# Patient Record
Sex: Female | Born: 1935
Health system: Southern US, Community
[De-identification: ages and names within clinical notes are randomized; demographics above are authoritative.]

## PROBLEM LIST (undated history)

## (undated) DIAGNOSIS — I10 Essential (primary) hypertension: Secondary | ICD-10-CM

## (undated) DIAGNOSIS — I639 Cerebral infarction, unspecified: Secondary | ICD-10-CM

## (undated) DIAGNOSIS — H548 Legal blindness, as defined in USA: Secondary | ICD-10-CM

## (undated) DIAGNOSIS — I6930 Unspecified sequelae of cerebral infarction: Secondary | ICD-10-CM

## (undated) DIAGNOSIS — I739 Peripheral vascular disease, unspecified: Secondary | ICD-10-CM

## (undated) DIAGNOSIS — E785 Hyperlipidemia, unspecified: Secondary | ICD-10-CM

## (undated) HISTORY — DX: Essential (primary) hypertension: I10

## (undated) HISTORY — DX: Peripheral vascular disease, unspecified: I73.9

## (undated) HISTORY — DX: Hyperlipidemia, unspecified: E78.5

## (undated) HISTORY — DX: Legal blindness, as defined in USA: H54.8

## (undated) HISTORY — DX: Unspecified sequelae of cerebral infarction: I69.30

---

## 2005-04-29 ENCOUNTER — Ambulatory Visit: Payer: Self-pay | Admitting: Family Medicine

## 2005-05-26 ENCOUNTER — Ambulatory Visit: Payer: Self-pay | Admitting: Unknown Physician Specialty

## 2005-05-29 ENCOUNTER — Ambulatory Visit: Payer: Self-pay | Admitting: Vascular Surgery

## 2005-06-03 ENCOUNTER — Ambulatory Visit: Payer: Self-pay | Admitting: Vascular Surgery

## 2005-09-16 ENCOUNTER — Ambulatory Visit: Payer: Self-pay | Admitting: Gastroenterology

## 2006-01-20 ENCOUNTER — Ambulatory Visit: Payer: Self-pay | Admitting: Family Medicine

## 2006-02-02 ENCOUNTER — Ambulatory Visit: Payer: Self-pay | Admitting: General Surgery

## 2006-02-02 ENCOUNTER — Other Ambulatory Visit: Payer: Self-pay

## 2006-02-03 ENCOUNTER — Ambulatory Visit: Payer: Self-pay | Admitting: General Surgery

## 2006-07-30 ENCOUNTER — Emergency Department: Payer: Self-pay | Admitting: Emergency Medicine

## 2006-07-31 ENCOUNTER — Inpatient Hospital Stay: Payer: Self-pay | Admitting: Internal Medicine

## 2006-07-31 ENCOUNTER — Other Ambulatory Visit: Payer: Self-pay

## 2006-11-28 ENCOUNTER — Other Ambulatory Visit: Payer: Self-pay

## 2006-11-28 ENCOUNTER — Emergency Department: Payer: Self-pay

## 2008-05-03 ENCOUNTER — Ambulatory Visit: Payer: Self-pay | Admitting: Family Medicine

## 2008-11-17 HISTORY — PX: PERIPHERAL ARTERIAL STENT GRAFT: SHX2220

## 2014-05-29 DIAGNOSIS — F17200 Nicotine dependence, unspecified, uncomplicated: Secondary | ICD-10-CM | POA: Diagnosis not present

## 2014-05-29 DIAGNOSIS — R5381 Other malaise: Secondary | ICD-10-CM | POA: Diagnosis not present

## 2014-05-29 DIAGNOSIS — R5383 Other fatigue: Secondary | ICD-10-CM | POA: Diagnosis not present

## 2014-05-29 DIAGNOSIS — M79609 Pain in unspecified limb: Secondary | ICD-10-CM | POA: Diagnosis not present

## 2014-06-08 DIAGNOSIS — M79609 Pain in unspecified limb: Secondary | ICD-10-CM | POA: Diagnosis not present

## 2014-06-08 DIAGNOSIS — I70229 Atherosclerosis of native arteries of extremities with rest pain, unspecified extremity: Secondary | ICD-10-CM | POA: Diagnosis not present

## 2014-06-08 DIAGNOSIS — I10 Essential (primary) hypertension: Secondary | ICD-10-CM | POA: Diagnosis not present

## 2014-06-08 DIAGNOSIS — E785 Hyperlipidemia, unspecified: Secondary | ICD-10-CM | POA: Diagnosis not present

## 2014-06-19 DIAGNOSIS — I739 Peripheral vascular disease, unspecified: Secondary | ICD-10-CM | POA: Diagnosis not present

## 2014-06-22 DIAGNOSIS — M79609 Pain in unspecified limb: Secondary | ICD-10-CM | POA: Diagnosis not present

## 2014-06-22 DIAGNOSIS — I70229 Atherosclerosis of native arteries of extremities with rest pain, unspecified extremity: Secondary | ICD-10-CM | POA: Diagnosis not present

## 2014-06-22 DIAGNOSIS — I714 Abdominal aortic aneurysm, without rupture, unspecified: Secondary | ICD-10-CM | POA: Diagnosis not present

## 2014-06-22 DIAGNOSIS — I739 Peripheral vascular disease, unspecified: Secondary | ICD-10-CM | POA: Diagnosis not present

## 2014-06-27 ENCOUNTER — Ambulatory Visit: Payer: Self-pay | Admitting: Vascular Surgery

## 2014-06-27 DIAGNOSIS — M79609 Pain in unspecified limb: Secondary | ICD-10-CM | POA: Diagnosis not present

## 2014-06-27 DIAGNOSIS — F17200 Nicotine dependence, unspecified, uncomplicated: Secondary | ICD-10-CM | POA: Diagnosis not present

## 2014-06-27 DIAGNOSIS — I70229 Atherosclerosis of native arteries of extremities with rest pain, unspecified extremity: Secondary | ICD-10-CM | POA: Diagnosis not present

## 2014-06-27 DIAGNOSIS — I714 Abdominal aortic aneurysm, without rupture, unspecified: Secondary | ICD-10-CM | POA: Diagnosis not present

## 2014-06-27 DIAGNOSIS — E785 Hyperlipidemia, unspecified: Secondary | ICD-10-CM | POA: Diagnosis not present

## 2014-06-27 LAB — BASIC METABOLIC PANEL
Anion Gap: 5 — ABNORMAL LOW (ref 7–16)
BUN: 9 mg/dL (ref 7–18)
CREATININE: 0.74 mg/dL (ref 0.60–1.30)
Calcium, Total: 8.1 mg/dL — ABNORMAL LOW (ref 8.5–10.1)
Chloride: 107 mmol/L (ref 98–107)
Co2: 28 mmol/L (ref 21–32)
GLUCOSE: 99 mg/dL (ref 65–99)
Osmolality: 278 (ref 275–301)
Potassium: 3.7 mmol/L (ref 3.5–5.1)
SODIUM: 140 mmol/L (ref 136–145)

## 2014-06-27 LAB — BUN: BUN: 9 mg/dL (ref 7–18)

## 2014-06-27 LAB — CREATININE, SERUM
Creatinine: 0.72 mg/dL (ref 0.60–1.30)
EGFR (African American): >60

## 2014-06-27 LAB — PROTIME-INR
INR: 1
PROTHROMBIN TIME: 12.7 s (ref 11.5–14.7)

## 2014-06-28 DIAGNOSIS — M79609 Pain in unspecified limb: Secondary | ICD-10-CM | POA: Diagnosis not present

## 2014-06-28 DIAGNOSIS — E785 Hyperlipidemia, unspecified: Secondary | ICD-10-CM | POA: Diagnosis not present

## 2014-06-28 DIAGNOSIS — I714 Abdominal aortic aneurysm, without rupture, unspecified: Secondary | ICD-10-CM | POA: Diagnosis not present

## 2014-06-28 DIAGNOSIS — I70229 Atherosclerosis of native arteries of extremities with rest pain, unspecified extremity: Secondary | ICD-10-CM | POA: Diagnosis not present

## 2014-06-28 DIAGNOSIS — F17200 Nicotine dependence, unspecified, uncomplicated: Secondary | ICD-10-CM | POA: Diagnosis not present

## 2014-06-28 LAB — CBC WITH DIFFERENTIAL/PLATELET
Basophil #: 0 10*3/uL (ref 0.0–0.1)
Basophil %: 0.3 %
EOS ABS: 0.1 10*3/uL (ref 0.0–0.7)
EOS PCT: 0.7 %
HCT: 31.7 % — AB (ref 35.0–47.0)
HGB: 10.1 g/dL — ABNORMAL LOW (ref 12.0–16.0)
Lymphocyte #: 2 10*3/uL (ref 1.0–3.6)
Lymphocyte %: 20.6 %
MCH: 29.7 pg (ref 26.0–34.0)
MCHC: 32 g/dL (ref 32.0–36.0)
MCV: 93 fL (ref 80–100)
MONO ABS: 0.5 x10 3/mm (ref 0.2–0.9)
Monocyte %: 5.5 %
Neutrophil #: 7 10*3/uL — ABNORMAL HIGH (ref 1.4–6.5)
Neutrophil %: 72.9 %
Platelet: 240 10*3/uL (ref 150–440)
RBC: 3.4 10*6/uL — ABNORMAL LOW (ref 3.80–5.20)
RDW: 14.6 % — ABNORMAL HIGH (ref 11.5–14.5)
WBC: 9.6 10*3/uL (ref 3.6–11.0)

## 2014-06-28 LAB — BASIC METABOLIC PANEL
Anion Gap: 6 — ABNORMAL LOW (ref 7–16)
BUN: 8 mg/dL (ref 7–18)
CHLORIDE: 108 mmol/L — AB (ref 98–107)
CREATININE: 0.68 mg/dL (ref 0.60–1.30)
Calcium, Total: 7.9 mg/dL — ABNORMAL LOW (ref 8.5–10.1)
Co2: 25 mmol/L (ref 21–32)
Glucose: 88 mg/dL (ref 65–99)
Osmolality: 275 (ref 275–301)
Potassium: 3.5 mmol/L (ref 3.5–5.1)
Sodium: 139 mmol/L (ref 136–145)

## 2014-07-07 ENCOUNTER — Ambulatory Visit: Payer: Self-pay | Admitting: Family Medicine

## 2014-07-07 DIAGNOSIS — J449 Chronic obstructive pulmonary disease, unspecified: Secondary | ICD-10-CM | POA: Diagnosis not present

## 2014-07-07 DIAGNOSIS — F17200 Nicotine dependence, unspecified, uncomplicated: Secondary | ICD-10-CM | POA: Diagnosis not present

## 2014-07-07 DIAGNOSIS — J438 Other emphysema: Secondary | ICD-10-CM | POA: Diagnosis not present

## 2014-07-07 DIAGNOSIS — J40 Bronchitis, not specified as acute or chronic: Secondary | ICD-10-CM | POA: Diagnosis not present

## 2014-07-07 DIAGNOSIS — I739 Peripheral vascular disease, unspecified: Secondary | ICD-10-CM | POA: Diagnosis not present

## 2014-07-27 DIAGNOSIS — M1990 Unspecified osteoarthritis, unspecified site: Secondary | ICD-10-CM | POA: Diagnosis not present

## 2014-07-27 DIAGNOSIS — I739 Peripheral vascular disease, unspecified: Secondary | ICD-10-CM | POA: Diagnosis not present

## 2014-07-27 DIAGNOSIS — I70219 Atherosclerosis of native arteries of extremities with intermittent claudication, unspecified extremity: Secondary | ICD-10-CM | POA: Diagnosis not present

## 2014-07-27 DIAGNOSIS — M79609 Pain in unspecified limb: Secondary | ICD-10-CM | POA: Diagnosis not present

## 2014-09-05 DIAGNOSIS — H2513 Age-related nuclear cataract, bilateral: Secondary | ICD-10-CM | POA: Diagnosis not present

## 2014-09-26 DIAGNOSIS — M79609 Pain in unspecified limb: Secondary | ICD-10-CM | POA: Diagnosis not present

## 2014-10-26 DIAGNOSIS — M1990 Unspecified osteoarthritis, unspecified site: Secondary | ICD-10-CM | POA: Diagnosis not present

## 2014-10-26 DIAGNOSIS — I10 Essential (primary) hypertension: Secondary | ICD-10-CM | POA: Diagnosis not present

## 2014-10-26 DIAGNOSIS — M79609 Pain in unspecified limb: Secondary | ICD-10-CM | POA: Diagnosis not present

## 2014-10-26 DIAGNOSIS — I739 Peripheral vascular disease, unspecified: Secondary | ICD-10-CM | POA: Diagnosis not present

## 2015-03-10 NOTE — Op Note (Signed)
PATIENT NAME:  Kaitlyn Gonzalez, Kaitlyn Gonzalez MR#:  [id number] DATE OF BIRTH:  11/24/1935  DATE OF PROCEDURE:  06/27/2014  PREOPERATIVE DIAGNOSES:  1.  Atherosclerotic occlusive disease, bilateral lower extremities with rest pain of the left lower extremity.  2.  Abdominal aortic aneurysm.  3.  Complication of vascular device.   POSTOPERATIVE DIAGNOSES: 1.  Atherosclerotic occlusive disease, bilateral lower extremities with rest pain of the left lower extremity.  2.  Abdominal aortic aneurysm.  3.  Complication of vascular device.  PROCEDURES PERFORMED:  1.  Abdominal aortogram.  2.  Left lower extremity distal runoff, third order catheter placement.  3.  Crosser atherectomy, left superficial femoral artery.  4.  Percutaneous transluminal angioplasty using Lutonix balloons, left common femoral superficial femoral artery and popliteal.   SURGEON: Katha Cabal, MD  SEDATION: Versed 5 mg plus fentanyl 200 mcg administered IV. Continuous ECG, pulse oximetry and cardiopulmonary monitoring was performed throughout the entire procedure by the interventional radiology nurse. Total sedation time was 1 hour, 50 minutes.   ACCESS: A 7 French sheath, right common femoral artery.   FLUOROSCOPY TIME: 25.3 minutes.   CONTRAST USED: Isovue 105 mL.   INDICATIONS: Ms. Gasca is a 79 year old woman with extensive atherosclerotic occlusive disease. She has undergone multiple interventions in the past and has vascular stents in several locations. She also has a known abdominal aortic aneurysm. However, this aneurysm measures less than 4 cm and does not need to be treated at this time. The most recent office evaluation demonstrated occlusion of the SFA stent and she is complaining of increasing pain in her left foot continuously throughout the day as well as in the evening consistent with rest pain. Risks and benefits for angiography and intervention were reviewed. All questions answered. The patient agrees to  proceed.   DESCRIPTION OF PROCEDURE: The patient is taken to special procedures, placed in the supine position. After adequate sedation is achieved, both groins are prepped and draped in sterile fashion. Ultrasound is placed in a sterile sleeve. Ultrasound is utilized secondary to lack of appropriate landmarks to avoid vascular injury. Under real-time visualization, the common femoral vein is identified. It is echolucent and pulsatile indicating patency. More proximally, it is free of a significant plaque burden and this is the area selected for access. Lidocaine 1% is infiltrated under visualization with the ultrasound and subsequently a micropuncture needle was inserted under ultrasound guidance. Microwire followed by micro sheath, J-wire followed by a 5 French sheath and 5 French pigtail catheter.   The pigtail catheter is positioned at the level of T12 and AP projection of the aorta is obtained.   Pigtail catheter is repositioned to above the bifurcation and bilateral oblique views of the pelvis are obtained secondary to the previously placed stents.   Using a VS1 catheter and a Rosen wire, the left common iliac stent is engaged with the assistance of the [employer] wire. The wire and catheter combination are advanced through the stents without entangling or ensnaring the struts, and the wire is negotiated down to the level of the common femoral on the left. Subsequently, the VS1 catheter is removed and a pigtail catheter is advanced. An LAO projection of the groin is obtained, and subsequently Glidewire is negotiated through the pigtail catheter into the SFA. The catheter is advanced into the SFA and distal runoff is obtained. There is reconstitution of the occluded SFA at the level of the distal margin of the previously placed stent. Distal runoff is single-vessel via the  peroneal. The at-knee and below-knee popliteal appear to be patent, although somewhat small.   Heparin 4000 units is given. Wire is  reintroduced and subsequently an [place] 7 [place] sheath is advanced up and over the bifurcation, positioned with the tip in the common femoral artery. Catheter is then advanced down into the first several centimeters of the SFA down to the level of the occlusion of the SFA. An 0.018 v-18 wire is then advanced and subsequently a straight Usher catheter is advanced, S6 prepped on the field and then delivered through the Usher catheter. The catheter and S6 device are then tracked down to the distal ends of the stent. At this level, hand injection of contrast demonstrates that there is a dissection plane, however after exchanging the Usher for an angled glide and using a stiff-angled Glidewire, the true lumen is engaged and the catheter is advanced down into the popliteal. Hand injection of contrast demonstrates true lumen placement and again confirms single-vessel peroneal runoff. Straight wire is then reintroduced and a 4 x 15 Lutonix balloon is passed from the mid popliteal distally. It is inflated to 12 atmospheres for 3 minutes. A second for 4 x 15 Lutonix is then added more proximally, again, a 3 minute inflation at 12 atmospheres and finally, a 5 x 6 Lutonix balloon is used across the common femoral and into the origin of the SFA. Followup imaging demonstrates significant residual stenosis as well as dissection in the common femoral. This does not appear to be flow-limiting; however, the residual stenoses within the SFA does appear to be problematic and therefore a 5 x 30 balloon is advanced across the SFA, inflated to 14 atmospheres for 3 minutes. Followup imaging now demonstrates there is fairly rapid flow of contrast through the SFA, popliteal, there are areas that are concerning for both non-flow-limiting dissection as well as moderate restenosis; however, given the overall situation, particularly with the non-flow-limiting dissection in the femoral, I have elected to initiate Aggrastat and observe the  patient overnight rather than start placing stents, particularly since stenting in the common femoral would be ill advised.   The sheath is then pulled into the right external. Oblique view of the right groin is obtained and a StarClose device is deployed. Initially, there appeared to be a good result; however, on transfer, the patient had a hematoma that was noted and pressure has been held. There were no other complications.   INTERPRETATION: The abdominal aorta is opacified by the bolus injection of contrast. There is a small aneurysm that appears to be similar on angiography as in real-life, based on the calcifications of the wall noted on fluoroscopy. Bilateral common iliac artery stents are noted, and these appear to be patent, although the left side is very tortuous with almost a sigmoid shape to it. Internal iliacs are rather small and diseased bilaterally.   The left common femoral demonstrates moderate disease with 50%-60%, there is a greater than 80% narrowing at the origin of the profunda, there is occlusion of the SFA several centimeters beyond its origin. This occluded segment includes the previously placed stent in the mid SFA. There is reconstitution just below the margin of the stent distally and then the popliteal remains patent from its midportion distally, there is single-vessel runoff via the peroneal. Following initial angioplasty, there is now flow, but there appears to be significant residual stenosis following re-angioplasty to 5 mm, there now appears to be fairly good flow. There are still several areas of irregularity and  a non-flow-limiting dissection of the common femoral artery.   SUMMARY: Successful recanalization. Patient has several areas of irregularity and the common femoral dissection and therefore she will be maintained on Aggrastat overnight, with the hope of discharge in the morning.    ____________________________ Katha Cabal, MD ggs:TT D: 06/27/2014  13:29:25 ET T: 06/27/2014 15:41:31 ET JOB#: [id number]  cc: Katha Cabal, MD, <Dictator> Katha Cabal MD ELECTRONICALLY SIGNED 06/27/2014 17:18

## 2015-03-12 ENCOUNTER — Inpatient Hospital Stay
Admit: 2015-03-12 | Disposition: A | Discharge status: Home / Self Care | Payer: Self-pay | Attending: Internal Medicine | Admitting: Internal Medicine

## 2015-03-12 DIAGNOSIS — H5441 Blindness, right eye, normal vision left eye: Secondary | ICD-10-CM | POA: Diagnosis not present

## 2015-03-12 DIAGNOSIS — I6521 Occlusion and stenosis of right carotid artery: Secondary | ICD-10-CM | POA: Diagnosis present

## 2015-03-12 DIAGNOSIS — I6523 Occlusion and stenosis of bilateral carotid arteries: Secondary | ICD-10-CM | POA: Diagnosis not present

## 2015-03-12 DIAGNOSIS — Z8249 Family history of ischemic heart disease and other diseases of the circulatory system: Secondary | ICD-10-CM | POA: Diagnosis not present

## 2015-03-12 DIAGNOSIS — I739 Peripheral vascular disease, unspecified: Secondary | ICD-10-CM | POA: Diagnosis present

## 2015-03-12 DIAGNOSIS — I361 Nonrheumatic tricuspid (valve) insufficiency: Secondary | ICD-10-CM | POA: Diagnosis not present

## 2015-03-12 DIAGNOSIS — E785 Hyperlipidemia, unspecified: Secondary | ICD-10-CM | POA: Diagnosis not present

## 2015-03-12 DIAGNOSIS — F17200 Nicotine dependence, unspecified, uncomplicated: Secondary | ICD-10-CM | POA: Diagnosis not present

## 2015-03-12 DIAGNOSIS — F17210 Nicotine dependence, cigarettes, uncomplicated: Secondary | ICD-10-CM | POA: Diagnosis present

## 2015-03-12 DIAGNOSIS — J984 Other disorders of lung: Secondary | ICD-10-CM | POA: Diagnosis not present

## 2015-03-12 DIAGNOSIS — R531 Weakness: Secondary | ICD-10-CM | POA: Diagnosis not present

## 2015-03-12 DIAGNOSIS — R930 Abnormal findings on diagnostic imaging of skull and head, not elsewhere classified: Secondary | ICD-10-CM | POA: Diagnosis not present

## 2015-03-12 DIAGNOSIS — I671 Cerebral aneurysm, nonruptured: Secondary | ICD-10-CM | POA: Diagnosis not present

## 2015-03-12 DIAGNOSIS — R51 Headache: Secondary | ICD-10-CM | POA: Diagnosis not present

## 2015-03-12 DIAGNOSIS — I639 Cerebral infarction, unspecified: Secondary | ICD-10-CM | POA: Diagnosis not present

## 2015-03-12 DIAGNOSIS — I10 Essential (primary) hypertension: Secondary | ICD-10-CM | POA: Diagnosis present

## 2015-03-12 DIAGNOSIS — E119 Type 2 diabetes mellitus without complications: Secondary | ICD-10-CM | POA: Diagnosis not present

## 2015-03-12 DIAGNOSIS — M79602 Pain in left arm: Secondary | ICD-10-CM | POA: Diagnosis not present

## 2015-03-12 DIAGNOSIS — G8194 Hemiplegia, unspecified affecting left nondominant side: Secondary | ICD-10-CM | POA: Diagnosis not present

## 2015-03-12 DIAGNOSIS — Z720 Tobacco use: Secondary | ICD-10-CM | POA: Diagnosis not present

## 2015-03-12 LAB — COMPREHENSIVE METABOLIC PANEL
ALK PHOS: 63 U/L
ALT: 7 U/L — AB
Albumin: 3.7 g/dL
Anion Gap: 12 (ref 7–16)
BUN: 13 mg/dL
Bilirubin,Total: 0.1 mg/dL — ABNORMAL LOW
Calcium, Total: 9.3 mg/dL
Chloride: 100 mmol/L — ABNORMAL LOW
Co2: 26 mmol/L
Creatinine: 0.77 mg/dL
EGFR (African American): >60
Glucose: 97 mg/dL
Potassium: 3.4 mmol/L — ABNORMAL LOW
SGOT(AST): 15 U/L
Sodium: 138 mmol/L
TOTAL PROTEIN: 8.2 g/dL — AB

## 2015-03-12 LAB — CBC
HCT: 37.1 % (ref 35.0–47.0)
HGB: 12.4 g/dL (ref 12.0–16.0)
MCH: 29.7 pg (ref 26.0–34.0)
MCHC: 33.3 g/dL (ref 32.0–36.0)
MCV: 89 fL (ref 80–100)
PLATELETS: 351 10*3/uL (ref 150–440)
RBC: 4.16 10*6/uL (ref 3.80–5.20)
RDW: 14.4 % (ref 11.5–14.5)
WBC: 8 10*3/uL (ref 3.6–11.0)

## 2015-03-12 LAB — APTT: Activated PTT: 31.4 secs (ref 23.6–35.9)

## 2015-03-12 LAB — PROTIME-INR
INR: 0.9
PROTHROMBIN TIME: 12.5 s

## 2015-03-12 LAB — URINALYSIS, COMPLETE
Bacteria: NONE SEEN
Bilirubin,UR: NEGATIVE
GLUCOSE, UR: NEGATIVE mg/dL (ref 0–75)
Ketone: NEGATIVE
Nitrite: NEGATIVE
Ph: 7 (ref 4.5–8.0)
Protein: 100
Specific Gravity: 1.006 (ref 1.003–1.030)

## 2015-03-13 DIAGNOSIS — I361 Nonrheumatic tricuspid (valve) insufficiency: Secondary | ICD-10-CM

## 2015-03-13 LAB — BASIC METABOLIC PANEL
Anion Gap: 7 (ref 7–16)
BUN: 11 mg/dL
CO2: 27 mmol/L
Calcium, Total: 8.8 mg/dL — ABNORMAL LOW
Chloride: 106 mmol/L
Creatinine: 0.54 mg/dL
EGFR (African American): >60
EGFR (Non-African Amer.): >60
GLUCOSE: 113 mg/dL — AB
Potassium: 3.4 mmol/L — ABNORMAL LOW
Sodium: 140 mmol/L

## 2015-03-13 LAB — MAGNESIUM: MAGNESIUM: 1.7 mg/dL

## 2015-03-13 LAB — CBC WITH DIFFERENTIAL/PLATELET
BASOS ABS: 0 10*3/uL (ref 0.0–0.1)
Basophil %: 0.5 %
Eosinophil #: 0.2 10*3/uL (ref 0.0–0.7)
Eosinophil %: 2.3 %
HCT: 35.5 % (ref 35.0–47.0)
HGB: 11.6 g/dL — ABNORMAL LOW (ref 12.0–16.0)
Lymphocyte #: 2.6 10*3/uL (ref 1.0–3.6)
Lymphocyte %: 32.3 %
MCH: 29.1 pg (ref 26.0–34.0)
MCHC: 32.6 g/dL (ref 32.0–36.0)
MCV: 89 fL (ref 80–100)
MONOS PCT: 5.8 %
Monocyte #: 0.5 x10 3/mm (ref 0.2–0.9)
NEUTROS PCT: 59.1 %
Neutrophil #: 4.8 10*3/uL (ref 1.4–6.5)
PLATELETS: 354 10*3/uL (ref 150–440)
RBC: 3.98 10*6/uL (ref 3.80–5.20)
RDW: 14.5 % (ref 11.5–14.5)
WBC: 8.2 10*3/uL (ref 3.6–11.0)

## 2015-03-13 LAB — LIPID PANEL
CHOLESTEROL: 167 mg/dL
HDL Cholesterol: 52 mg/dL
Ldl Cholesterol, Calc: 99 mg/dL
Triglycerides: 82 mg/dL
VLDL Cholesterol, Calc: 16 mg/dL

## 2015-03-13 LAB — HEMOGLOBIN A1C: Hemoglobin A1C: 5.3 %

## 2015-03-13 LAB — TSH: Thyroid Stimulating Horm: 1.44 u[IU]/mL

## 2015-03-18 NOTE — Discharge Summary (Signed)
PATIENT NAME:  Kaitlyn Gonzalez, Kaitlyn Gonzalez MR#:  [id number] DATE OF BIRTH:  April 17, 1936  DATE OF ADMISSION:  03/12/2015 DATE OF DISCHARGE:  03/13/2015  ADMITTING PHYSICIAN:  Nicholes Mango, MD   DISCHARGING PHYSICIAN:   Gladstone Lighter, MD   PRIMARY CARE PHYSICIAN: Debbora Dus, MD  [place]:  Neurology consultation with Dr. Leotis Pain, MD.   DISCHARGE DIAGNOSES: 1. Acute cerebrovascular accident with minimal left sided residual weakness.  2. Hypertension.  3. Hyperlipidemia.  4. Right eye legal blindness.  5. Significant peripheral vascular disease.   DISCHARGE HOME MEDICATIONS:  1. Multivitamin 1 tablet p.o. every day. 2. Simvastatin 20 mg p.o. daily.  3. Aspirin 81 mg p.o. daily.  4. Amlodipine 5 mg p.o. daily.  5. Metoprolol 25 mg p.o. b.i.d.   DISCHARGE DIET: Low sodium diet.   DISCHARGE ACTIVITY: As tolerated.    FOLLOWUP INSTRUCTIONS: 1. Neurology follow-up in 4 weeks.  2. Vascular follow-up in 4-6 weeks.  3. PCP follow-up in 2 weeks.  4. Outpatient physical therapy, services.   LABORATORIES AND IMAGING STUDIES PRIOR TO DISCHARGE:  1. MRI of the brain showing acute subinsular and deep white matter infarct in the right centrum semiovale likely related to small vessel disease.  Generalized atrophy and moderately advanced small vessel disease throughout the brain is noted.  2. Ultrasound Dopplers of the carotid arteries showing moderate calcified irregular plaque formation noted in  proximal right internal carotid artery consistent with 50-69% stenosis.  Mild irregular calcified plaque also noted in left internal carotid artery consistent with  less than 50% stenosis.  3. MRA of the neck confirming 65% proximal right internal carotid artery   stenosis, 50% proximal left internal carotid artery stenosis and 50% proximal left subclavian artery stenosis.  4. 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   Mild stenosis of the distal left vertebral artery.   5. LDL cholesterol 99, HDL 52, total cholesterol 167, triglycerides 82.  6. WBC 8.2, hemoglobin 11.6, hematocrit 35.5, platelet count 354.  7. Sodium 140, potassium 3.4, chloride 106, bicarbonate 27, BUN 11, creatinine 0.54, glucose 113, and calcium of 8.8. TSH is 1.4, magnesium 1.7.   HbA1c is 5.3.   8. Echo Doppler showing normal LV ejection fraction, EF of 60-65%, normal right ventricular size and systolic function, mild tricuspid regurgitation.  9. Urinalysis negative for any infection.  10. CT of the head on admission showing  no acute intracranial abnormalities. There is  possible cavernous sinus mass versus. Pituitary adenoma.  Please do an MRI of the brain to confirm. Chest x-ray showing no clear acute cardiopulmonary disease.   BRIEF HOSPITAL COURSE: Kaitlyn Gonzalez is a 79 year old African American female with a past medical history significant for severe peripheral vascular disease requiring stents in the lower extremities, not taking any medications at home, presents to the hospital secondary to left-sided weakness.  1. Left-sided weakness. CT of the head did not show any acute intracranial findings. The patient had persistent weakness on the left side; however, she was recovering from the same. She was supposed to be on aspirin for her PVD, which she was not taking regularly. She says that bigger strength aspirin has caused her significant stomach problems. She is started on low-dose aspirin. Cholesterol was 99.  She was started on a statin.   Carotid Doppler shows moderate carotid artery stenosis. An MRA of the brain confined acute infarct in the  right centrum semiovale. Her weakness has recovered to the extent that she did not require any home health after evaluating by PT and OT. The patient will be discharged home on aspirin and statin. She was also seen by a neurologist.  2. For her  severe peripheral vascular disease including the moderate carotid artery stenosis and subclavian stenosis and lower extremity PVD, she can follow up with vascular.  Incidental finding of 2 cm partially thrombosed cavernous sinus aneurysm was also noted and was explained to the family.   3. Hypertension. Started on metoprolol and Norvasc.   Her course has been otherwise uneventful in the hospital.   DISCHARGE CONDITION: Stable.   DISCHARGE DISPOSITION: Home.   TIME SPENT ON DISCHARGE: 40 minutes.     ____________________________ Gladstone Lighter, MD rk:tr D: 03/14/2015 10:37:00 ET T: 03/14/2015 13:27:21 ET JOB#: [id number]  cc: Gladstone Lighter, MD, <Dictator> Gladstone Lighter MD ELECTRONICALLY SIGNED 03/15/2015 18:17

## 2015-03-18 NOTE — H&P (Addendum)
PATIENT NAME:  Kaitlyn Gonzalez, Kaitlyn Gonzalez MR#:  [id number] DATE OF BIRTH:  [birth year]  DATE OF ADMISSION:  03/12/2015  PRIMARY CARE PHYSICIAN: Fara Olden B. Jacqualine Code, MD  REFERRING EMERGENCY DEPARTMENT PHYSICIAN: Briant Sites. Joni Fears, MD   CHIEF COMPLAINT: Left-sided weakness from 8:30 a.m.   HISTORY OF PRESENT ILLNESS: The patient is a 79 year old African American female with past medical history of peripheral vascular disease status post stents and no other medical problem who is presenting to the ED with a chief complaint of left-sided weakness, started from 8:30 a.m. today. The patient woke up with a headache. She took some Tylenol and subsequently it was resolved. Following that she started having left upper extremity and lower extremity weakness. Denies any dysphagia or dysarthria. No other complaints. No chest pain, shortness of breath. Came into the ED. CAT scan of the head did not reveal any acute stroke, but the patient still has left upper and lower extremity weakness which is all new. ED physician has given her aspirin and hospitalist team is called to admit the patient. During my examination, her blood pressure was elevated. Initial blood pressure when she came into the ED was 188/91. During my examination, systolic blood pressure was at 201/95. Sister is at bedside. No other complaints.   PAST MEDICAL HISTORY: Peripheral vascular disease status post stent.   PAST SURGICAL HISTORY: Stent placement for peripheral vascular disease.   ALLERGIES: No known drug allergies.   PSYCHOSOCIAL HISTORY: Lives at home. Still continues to smoke sometimes. Denies alcohol or illicit drug usage.   FAMILY HISTORY: Mother had history of hypertension.   HOME MEDICATIONS: Multivitamin with minerals once daily.  REVIEW OF SYSTEMS: CONSTITUTIONAL: Denies any fever, fatigue.  EYES: Denies blurry vision, double vision, but has right eye blindness after a foreign object damaged her right eye.  ENT: No tinnitus, discharge,  no hearing loss.  RESPIRATORY: Denies cough, COPD.  CARDIOVASCULAR: No chest pain, palpitations.  GASTROINTESTINAL: Denies nausea, vomiting, diarrhea, abdominal pain. GENITOURINARY: No dysuria or hematuria. GYNECOLOGIC AND BREASTS: Denies breast mass or vaginal discharge.  ENDOCRINE: Denies polyuria, nocturia, thyroid problems. HEMATOLOGIC AND LYMPHATIC: No anemia, easy bruising, bleeding.  INTEGUMENTARY: No acne, rash, lesions.  MUSCULOSKELETAL: No joint pain in the neck and back. Denies gout.  NEUROLOGIC: Denies vertigo, ataxia. No history of CVA, TIA.  PSYCHIATRIC: No ADD, OCD.   PHYSICAL EXAMINATION:  VITAL SIGNS: Temperature 97.5, pulse 90, respirations 18, blood pressure 187/95, pulse oximetry of 98% on room air.  GENERAL APPEARANCE: Not in acute distress. Moderately built and thin-looking African American female in no apparent distress.  HEENT: Normocephalic, atraumatic. Right eye is blind after foreign object damaged her right eye. Left eye pupil is reacting to light and accommodation.  NECK: Supple. No JVD. No thyromegaly. No masses.  LUNGS: Clear to auscultation bilaterally. No accessory muscle use. CHEST: There is no anterior chest wall tenderness on palpation.  CARDIAC: S1, S2 normal. Regular rate and rhythm. Positive murmur.  GASTROINTESTINAL: Soft. Bowel sounds are positive in all 4 quadrants. Nontender, nondistended. No masses felt.  NEUROLOGIC: Awake, alert, oriented x 3. No pronator drift. No obliteration of the nasolabial folds. No pronator drift but left upper extremity and lower extremity motor is decreased at 3/5. Sensory decreased, touch sensation and pain sensation of the left upper and lower extremity. Reflexes are 2+. No cerebellar signs.  EXTREMITIES: No edema, no cyanosis or clubbing.  SKIN: Warm to touch. Normal turgor. No rashes. No lesions.  MUSCULOSKELETAL: No joint effusion, tenderness, or edema. PSYCHIATRIC:  Normal mood and affect.  LABORATORY AND IMAGING  STUDIES: CAT scan of the head: No acute intracranial abnormalities. Left sellar cavernous sinus mass, most likely a pituitary macroadenoma. The patient does have prior imaging of the head, but the studies were unavailable for comparison at the time of this dictation, and this patient does not have a known sellar mass. Followup MRA of the brain with and without contrast will be recommended. Twelve-lead EKG: Sinus tachycardia at 95 beats per minute, first-degree AV block with prolonged PR interval at 202 ms, left ventricular hypertrophy, no acute ST-T wave changes. Chest x-ray: No acute abnormalities. Accu-Chek 105, glucose 97, BUN and creatinine normal, sodium normal, potassium 3.4, chloride 100, CO2 of 23. GFR greater than 60. Anion gap and calcium are normal. CBC normal. PT, INR is normal. LFTs: Total protein 8.2, albumin 3.7, bilirubin total is 0.1, ALT 7, alkaline phosphatase 63, AST 15.   ASSESSMENT AND PLAN: A 79 year old African American female presenting to the Emergency Department with a chief complaint of left upper extremity and lower extremity weakness since 8:30 a.m. Still having weakness. Her CT, head, is negative, but has revealed a pituitary macroadenoma. During my examination, blood pressure is very high, with systolic being at 290, with no known history of hypertension. Will be admitted with the following assessment and plan:  1.  Acute cerebrovascular accident with left-sided weakness from 8:30 a.m. today. CT, head, is negative for acute infarct, but the patient is still symptomatic. Will admit her to telemetry. Will provide her aspirin and statin.  2.  Will continue monitoring neurologic checks.  3.  Stroke workup with MRA of the brain, carotid Dopplers, and echocardiogram.  4.  Neurology consultation is placed to Dr. Irish Elders.  5.  Malignant hypertension with no past diagnosis of hypertension. Will start her on low-dose metoprolol 25 mg p.o. b.i.d. and allow permissive hypertension. Our  goal is to maintain systolic blood pressure at around 211D and diastolic at around 552C.  6.  Peripheral vascular disease status post stent. Will continue aspirin.  7.  Nicotine dependence. Counseled to quit completely for 3 to 5 minutes. She verbalized understanding. She wants to try on her own. Not considering nicotine patch at this time. 8.  We will provide gastrointestinal and deep venous thrombosis prophylaxis with Pepcid and Lovenox subcutaneous.  CODE STATUS: She is full code. Sister is the medical power-of-attorney.  Plan of care discussed in detail with the patient. She verbalized understanding of the plan.  TOTAL TIME SPENT: 50 minutes.   ____________________________ Nicholes Mango, MD ag:ST D: 03/12/2015 20:46:36 ET T: 03/12/2015 22:20:48 ET JOB#: [id number]  cc: Nicholes Mango, MD, <Dictator> Milinda Pointer. Jacqualine Code, MD Nicholes Mango MD ELECTRONICALLY SIGNED 03/18/2015 0:13

## 2015-03-18 NOTE — Consult Note (Signed)
PATIENT NAME:  Kaitlyn Gonzalez, Kaitlyn Gonzalez MR#:  [id number] DATE OF BIRTH:  [birth year]  DATE OF CONSULTATION:  03/13/2015  REFERRING PHYSICIAN:   CONSULTING PHYSICIAN:  Leotis Pain, MD  REASON FOR CONSULTATION: Left-sided weakness.   HISTORY OF PRESENT ILLNESS: This is a 79 year old African American female with past medical history of diabetes, hypertension, and smoking who presents with left upper extremity more so than left lower extremity weakness that has significantly improved. No new visual deficits. No new sensory deficits. Elevated blood pressure on admission, systolic blood pressure 417. Family is currently at bedside.  PAST MEDICAL HISTORY: Peripheral vascular disease, status post stent placement.   ALLERGIES: No known drug allergies.   PSYCHOSOCIAL HISTORY: Lives at home. Daily smoker. Denies any EtOH or illicit drug use.   FAMILY HISTORY: Hypertension.   HOME MEDICATIONS: Only multivitamins and seldomly uses her diabetes medications.   REVIEW OF SYSTEMS: No shortness of breath. Eye blindness on the right, which is chronic. No chest pain. No shortness of breath. Positive weakness in the left upper extremity. No anxiety. No depression. Denies any vertigo. Denies any ataxia. Denies any polyuria. Denies any nocturia.   DIAGNOSTIC DATA: MRI of the brain: the patient has right semiovale lacunar infarct.   CT scan of the head: No acute intracranial abnormality.   Laboratory work-up has been reviewed.   MEDICATIONS: Have been reviewed.   NEUROLOGIC EVALUATION: The patient is alert, awake and oriented to time, place, location and the reason why she is in the hospital. Facial sensation appears to be intact. Speech appears to be fluent. Visual loss in the right eye, which is chronic. Extraocular movements are intact. Right eye exotropia, chronic in nature again. Tongue is midline. Uvula elevates symmetrically. Shoulder shrug intact. Motor strength: Left upper extremity drift. Rest is 5/5.  Reflexes severely diminished. Sensation intact to light touch and temperature. Coordination: Finger-to-nose intact. No signs of ataxia present.   IMPRESSION: A 79 year old female presenting with left upper extremity numbness that has improved in the setting of hypertension, diabetes and long history of smoking, not on any antiplatelet therapy, has right semiovale stroke.   PLAN: Antiplatelet therapy with aspirin. Lipitor for her cholesterol. Physical therapy, occupational therapy, and discharge planning. Follow up with neurology as outpatient. The patient was strongly encouraged to stop smoking. More aggressive blood pressure and diabetic control. This was also discussed with family at beside.  Thank you. It was a pleasure seeing this patient.   ____________________________ Leotis Pain, MD yz:sb D: 03/13/2015 14:31:55 ET T: 03/13/2015 14:46:16 ET JOB#: [id number]  cc: Leotis Pain, MD, <Dictator> Leotis Pain MD ELECTRONICALLY SIGNED 03/13/2015 21:28

## 2015-03-29 DIAGNOSIS — I6350 Cerebral infarction due to unspecified occlusion or stenosis of unspecified cerebral artery: Secondary | ICD-10-CM | POA: Diagnosis not present

## 2015-03-29 DIAGNOSIS — M25552 Pain in left hip: Secondary | ICD-10-CM | POA: Diagnosis not present

## 2015-03-29 DIAGNOSIS — G8929 Other chronic pain: Secondary | ICD-10-CM | POA: Diagnosis not present

## 2015-04-04 DIAGNOSIS — M7072 Other bursitis of hip, left hip: Secondary | ICD-10-CM | POA: Diagnosis not present

## 2015-04-04 DIAGNOSIS — M25552 Pain in left hip: Secondary | ICD-10-CM | POA: Diagnosis not present

## 2016-02-06 DIAGNOSIS — M7552 Bursitis of left shoulder: Secondary | ICD-10-CM | POA: Diagnosis not present

## 2016-02-15 ENCOUNTER — Encounter: Payer: Self-pay | Admitting: Family Medicine

## 2016-02-15 ENCOUNTER — Ambulatory Visit (INDEPENDENT_AMBULATORY_CARE_PROVIDER_SITE_OTHER): Payer: Medicare Other | Admitting: Family Medicine

## 2016-02-15 VITALS — BP 112/67 | HR 88 | Temp 98.6°F | Resp 17 | Ht 64.0 in | Wt 88.1 lb

## 2016-02-15 DIAGNOSIS — R011 Cardiac murmur, unspecified: Secondary | ICD-10-CM | POA: Diagnosis not present

## 2016-02-15 DIAGNOSIS — I739 Peripheral vascular disease, unspecified: Secondary | ICD-10-CM

## 2016-02-15 DIAGNOSIS — I6930 Unspecified sequelae of cerebral infarction: Secondary | ICD-10-CM | POA: Diagnosis not present

## 2016-02-15 DIAGNOSIS — I10 Essential (primary) hypertension: Secondary | ICD-10-CM | POA: Insufficient documentation

## 2016-02-15 DIAGNOSIS — Z23 Encounter for immunization: Secondary | ICD-10-CM

## 2016-02-15 DIAGNOSIS — E785 Hyperlipidemia, unspecified: Secondary | ICD-10-CM | POA: Diagnosis not present

## 2016-02-15 MED ORDER — AMLODIPINE BESYLATE 5 MG PO TABS: 5.0000 mg | ORAL_TABLET | Freq: Every day | ORAL | Status: DC

## 2016-02-15 MED ORDER — SIMVASTATIN 20 MG PO TABS: 20.0000 mg | ORAL_TABLET | Freq: Every day | ORAL | Status: DC

## 2016-02-15 MED ORDER — METOPROLOL TARTRATE 25 MG PO TABS: 25.0000 mg | ORAL_TABLET | Freq: Two times a day (BID) | ORAL | Status: DC

## 2016-02-15 NOTE — Progress Notes (Signed)
Name: Kaitlyn Gonzalez   MRN: [id number]    DOB: [birth year]   Date:02/15/2016       Progress Note  Subjective  Chief Complaint  Chief Complaint  Patient presents with  . Medication Refill    HPI  Hypertension: Pt. Presents for evaluation of Blood Pressure. She was on Amlodipine 5 mg daily and Metoprolol 25 mg twice daily. Since she ran out of medication in June 2016, she has not taken any anti-hypertensive medications and has not followed up with any doctor except for Orthopedics. Her Blood Pressure is well-controlled today but was elevated in the Orthopedics office at 162/92.  History of Stroke: Pt. Has experienced a stroke in the deep white matter and subinsular region in April 2016, was started on Aspirin 81 mg and statin. MRA neck showed 65% proximal right ICA stenosis.She never followed up with Neurology and is not on either of these meds.   Hyperlipidemia: Was started on Simvastatin since her hospital discharge in April 2016, will obtain FLP and restart on statin therapy.  Peripheral vascular Disease: Pt. Has history of PVD, was being followed by Vascular surgery in the past, not seen lately. She has been on no anticoagulation, still continues to smoke. Pain is present mainly in left leg, worse with walking, better when she rests.    Past Medical History  Diagnosis Date  . Hypertension   . Dyslipidemia   . Peripheral vascular disease ([place])   . History of stroke with residual effects     Stroke in April 2016.  . Legally blind in right eye, as defined in [place]     Past Surgical History  Procedure Laterality Date  . Peripheral arterial stent graft Left 2010    Family History  Problem Relation Age of Onset  . Diabetes Sister   . Cancer Sister     breast  . Alzheimer's disease Mother   . AAA (abdominal aortic aneurysm) Father     Social History   Social History  . Marital Status: Divorced    Spouse Name: N/A  . Number of Children: N/A  . Years of Education: N/A    Occupational History  . Not on file.   Social History Main Topics  . Smoking status: Current Every Day Smoker -- 0.50 packs/day    Types: Cigarettes  . Smokeless tobacco: Never Used  . Alcohol Use: No  . Drug Use: No  . Sexual Activity: No   Other Topics Concern  . Not on file   Social History Narrative  . No narrative on file     Current outpatient prescriptions:  .  amLODipine (NORVASC) 5 MG tablet, Take 1 tablet (5 mg total) by mouth daily., Disp: 90 tablet, Rfl: 1 .  metoprolol tartrate (LOPRESSOR) 25 MG tablet, Take 1 tablet (25 mg total) by mouth 2 (two) times daily., Disp: 180 tablet, Rfl: 1 .  simvastatin (ZOCOR) 20 MG tablet, Take 1 tablet (20 mg total) by mouth at bedtime., Disp: 90 tablet, Rfl: 1  No Known Allergies   Review of Systems  Constitutional: Negative for fever, chills, weight loss and malaise/fatigue.  Cardiovascular: Negative for chest pain and palpitations.  Gastrointestinal: Negative for nausea, vomiting and abdominal pain.  Musculoskeletal: Positive for joint pain (left leg pain.).  Neurological: Negative for dizziness, tingling, speech change, focal weakness and headaches.    Objective  Filed Vitals:   02/15/16 1029  BP: 112/67  Pulse: 88  Temp: 98.6 F (37 C)  TempSrc: Oral  Resp:  17  Height: 5\' 4"  (1.626 m)  Weight: 88 lb 1.6 oz (39.962 kg)  SpO2: 98%    Physical Exam  Constitutional: She is oriented to person, place, and time and well-developed, well-nourished, and in no distress.  Cardiovascular: Normal rate, S1 normal and S2 normal.   Murmur heard.  Systolic murmur is present with a grade of 3/6  Pulses:      Dorsalis pedis pulses are 1+ on the right side, and 2+ on the left side.  Lower extremity cool to touch, capillary refill is normal, right DP pulse is weak, left DP pulse is normal. No wounds/ulcers on lower extremity  Pulmonary/Chest: Breath sounds normal. She has no wheezes.  Abdominal: Soft. Bowel sounds are  normal.  Neurological: She is alert and oriented to person, place, and time.  Nursing note and vitals reviewed.    Assessment & Plan  1. Need for immunization against influenza  - Flu Vaccine QUAD 36+ mos PF IM (Fluarix & Fluzone Quad PF)  2. Essential hypertension Restarted on the 2 blood pressure medications prescribed at the time of discharge from [facility] in April 2016 after her hospitalization for acute stroke. Advised to take half tablet daily and gradually titrated up based on patient's blood pressure response. - amLODipine (NORVASC) 5 MG tablet; Take 1 tablet (5 mg total) by mouth daily.  Dispense: 90 tablet; Refill: 1 - metoprolol tartrate (LOPRESSOR) 25 MG tablet; Take 1 tablet (25 mg total) by mouth 2 (two) times daily.  Dispense: 180 tablet; Refill: 1  3. Peripheral vascular disease ([place]) In the past, patient has been evaluated by vascular, was on Plavix as well, which was discontinued. Advised to follow-up with vascular surgery and referral provided. - Ambulatory referral to Vascular Surgery  4. Dyslipidemia  - Comprehensive Metabolic Panel (CMET) - Lipid Profile - simvastatin (ZOCOR) 20 MG tablet; Take 1 tablet (20 mg total) by mouth at bedtime.  Dispense: 90 tablet; Refill: 1  5. History of stroke with residual effects Patient never followed up with neurology after her discharge from [facility] in April 2016. Advised to start on 81 mg aspirin and referral to neurology provided to - Ambulatory referral to Neurology  6. Heart murmur on physical examination  - Ambulatory referral to Cardiology   [facility] 02/15/2016 3:57 PM

## 2016-02-16 LAB — COMPREHENSIVE METABOLIC PANEL
ALBUMIN: 4.3 g/dL (ref 3.5–4.8)
ALK PHOS: 64 IU/L (ref 39–117)
ALT: 4 IU/L (ref 0–32)
AST: 10 IU/L (ref 0–40)
Albumin/Globulin Ratio: 1.3 (ref 1.2–2.2)
BUN / CREAT RATIO: 16 (ref 11–26)
BUN: 9 mg/dL (ref 8–27)
Bilirubin Total: 0.3 mg/dL (ref 0.0–1.2)
CHLORIDE: 96 mmol/L (ref 96–106)
CO2: 26 mmol/L (ref 18–29)
CREATININE: 0.55 mg/dL — AB (ref 0.57–1.00)
Calcium: 9.4 mg/dL (ref 8.7–10.3)
GFR calc Af Amer: 105 mL/min/{1.73_m2} (ref >59)
GFR calc non Af Amer: 91 mL/min/{1.73_m2} (ref >59)
GLUCOSE: 90 mg/dL (ref 65–99)
Globulin, Total: 3.3 g/dL (ref 1.5–4.5)
Potassium: 3.7 mmol/L (ref 3.5–5.2)
Sodium: 142 mmol/L (ref 134–144)
Total Protein: 7.6 g/dL (ref 6.0–8.5)

## 2016-02-16 LAB — LIPID PANEL
CHOLESTEROL TOTAL: 191 mg/dL (ref 100–199)
Chol/HDL Ratio: 3.3 ratio units (ref 0.0–4.4)
HDL: 58 mg/dL (ref >39)
LDL CALC: 121 mg/dL — AB (ref 0–99)
TRIGLYCERIDES: 62 mg/dL (ref 0–149)
VLDL CHOLESTEROL CAL: 12 mg/dL (ref 5–40)

## 2016-03-05 ENCOUNTER — Ambulatory Visit: Payer: Medicare Other | Admitting: Cardiology

## 2016-03-25 ENCOUNTER — Ambulatory Visit (INDEPENDENT_AMBULATORY_CARE_PROVIDER_SITE_OTHER): Payer: Medicare Other | Admitting: Cardiology

## 2016-03-25 ENCOUNTER — Encounter: Payer: Self-pay | Admitting: Cardiology

## 2016-03-25 VITALS — BP 144/90 | HR 81 | Ht 64.0 in | Wt 87.0 lb

## 2016-03-25 DIAGNOSIS — I10 Essential (primary) hypertension: Secondary | ICD-10-CM | POA: Diagnosis not present

## 2016-03-25 DIAGNOSIS — R011 Cardiac murmur, unspecified: Secondary | ICD-10-CM

## 2016-03-25 DIAGNOSIS — F17200 Nicotine dependence, unspecified, uncomplicated: Secondary | ICD-10-CM

## 2016-03-25 DIAGNOSIS — I493 Ventricular premature depolarization: Secondary | ICD-10-CM | POA: Diagnosis not present

## 2016-03-25 DIAGNOSIS — I739 Peripheral vascular disease, unspecified: Secondary | ICD-10-CM

## 2016-03-25 NOTE — Progress Notes (Signed)
Cardiology Office Note   Date:  03/25/2016   ID:  Shaughnessy Brueck, DOB 01/29/1936, MRN [id number]  Referring Doctor:  Keith Rake, MD   Cardiologist:   Wende Bushy, MD   Reason for consultation:  Chief Complaint  Patient presents with  . Cerebrovascular Accident    no cp, sob or swelling. no other complaints.  . Heart Murmur      History of Present Illness: Kaitlyn Gonzalez is a 80 y.o. female who presents for  Evaluation of murmur    PCP noted a systolic murmur on physical exam. Patient does not complain of any chest pain, shortness of breath. She is able to walk a flight of stairs without any chest pains or shortness of breath. No palpitations. No lightheadedness or dizziness. No PND, orthopnea, edema. Next  She continues to smoke a half a pack a day. She knows she needs to stop smoking.   ROS:  Please see the history of present illness. Aside from mentioned under HPI, all other systems are reviewed and negative.     Past Medical History  Diagnosis Date  . Hypertension   . Dyslipidemia   . Peripheral vascular disease ([place])   . History of stroke with residual effects     Stroke in April 2016.  . Legally blind in right eye, as defined in [place]     Past Surgical History  Procedure Laterality Date  . Peripheral arterial stent graft Left 2010     reports that she has been smoking Cigarettes.  She has been smoking about 0.50 packs per day. She has never used smokeless tobacco. She reports that she does not drink alcohol or use illicit drugs.   family history includes AAA (abdominal aortic aneurysm) in her father; Alzheimer's disease in her mother; Cancer in her sister; Diabetes in her sister.   Current Outpatient Prescriptions  Medication Sig Dispense Refill  . amLODipine (NORVASC) 5 MG tablet Take 1 tablet (5 mg total) by mouth daily. 90 tablet 1  . metoprolol tartrate (LOPRESSOR) 25 MG tablet Take 1 tablet (25 mg total) by mouth 2 (two) times daily. 180  tablet 1  . simvastatin (ZOCOR) 20 MG tablet Take 1 tablet (20 mg total) by mouth at bedtime. 90 tablet 1   No current facility-administered medications for this visit.    She is supposed to takeaspirin 81 mg by mouth daily although patient does not regularly take this  Allergies: Review of patient's allergies indicates no known allergies.    PHYSICAL EXAM: VS:  BP 144/90 mmHg  Pulse 81  Ht 5\' 4"  (1.626 m)  Wt 87 lb (39.463 kg)  BMI 14.93 kg/m2 , Body mass index is 14.93 kg/(m^2). Wt Readings from Last 3 Encounters:  03/25/16 87 lb (39.463 kg)  02/15/16 88 lb 1.6 oz (39.962 kg)    GENERAL:  well developed,  Thin appearing, not in acute distress HEENT: normocephalic, pink conjunctivae, anicteric sclerae, no xanthelasma, normal dentition, oropharynx clear NECK:  no neck vein engorgement, JVP normal, no hepatojugular reflux, carotid upstroke brisk and symmetric, no bruit, no thyromegaly, no lymphadenopathy LUNGS:  good respiratory effort, clear to auscultation bilaterally CV:  PMI not displaced, no thrills, no lifts, S1 and S2 within normal limits, no palpable S3 or S4,  Systolic murmur , 2 out of 6, nonradiating, no rubs, no gallops ABD:  Soft, nontender, nondistended, normoactive bowel sounds, no abdominal aortic bruit, no hepatomegaly, no splenomegaly MS: nontender back, no kyphosis, no scoliosis, no joint  deformities EXT:   1+ DP/PT pulses, no edema, no varicosities, no cyanosis, no clubbing SKIN: warm, nondiaphoretic, normal turgor, no ulcers NEUROPSYCH: alert, oriented to person, place, and time, sensory/motor grossly intact, normal mood, appropriate affect  Recent Labs: 02/15/2016: ALT 4; BUN 9; Creatinine, Ser 0.55*; Potassium 3.7; Sodium 142   Lipid Panel    Component Value Date/Time   CHOL 191 02/15/2016 1144   CHOL 167 03/13/2015 0339   TRIG 62 02/15/2016 1144   TRIG 82 03/13/2015 0339   HDL 58 02/15/2016 1144   HDL 52 03/13/2015 0339   CHOLHDL 3.3 02/15/2016 1144    VLDL 16 03/13/2015 0339   LDLCALC 121* 02/15/2016 1144   LDLCALC 99 03/13/2015 0339     Other studies Reviewed:  EKG:  The ekg from  03/25/2016 was personally reviewed by me and it revealed  Sinus rhythm, 81 BPM. PACs and PVC. Nonspecific ST-T wave changes.  Additional studies/ records that were reviewed personally reviewed by me today include:  None available   ASSESSMENT AND PLAN:  systolic murmur  Recommend echocardiogram   PACs/PVCs  Patient unaware of ectopic beats. Recommend 24-hour Holter monitor to determine burden.   Peripheral arterial disease  Encouraged patient to take aspirin 8100s. Daily daily. Agree with Zocor. LDL goal is less than 70. PCP following labs.   Hypertension Continue monitoring BP. Continue current medical therapy and lifestyle changes.   Tobacco use disorder We discussed the importance of smoking cessation and different strategies for quitting.    Current medicines are reviewed at length with the patient today.  The patient does not have concerns regarding medicines.  Labs/ tests ordered today include:  Orders Placed This Encounter  Procedures  . Holter monitor - 24 hour  . EKG 12-Lead  . Echocardiogram    I had a lengthy and detailed discussion with the patient regarding diagnoses, prognosis, diagnostic options, treatment options .   I counseled the patient on importance of lifestyle modification including heart healthy diet, regular physical activity , and smoking cessation.   Disposition:   FU with undersigned after tests   I spent at least 45 minutes with the patient today and more than 50% of the time was spent counseling the patient and coordinating care.     Signed, Wende Bushy, MD  03/25/2016 3:47 PM    [place]

## 2016-03-25 NOTE — Patient Instructions (Addendum)
Medication Instructions:  Your physician recommends that you continue on your current medications as directed. Please refer to the Current Medication list given to you today.   Labwork: None ordered  Testing/Procedures: Your physician has requested that you have an echocardiogram. Echocardiography is a painless test that uses sound waves to create images of your heart. It provides your doctor with information about the size and shape of your heart and how well your heart's chambers and valves are working. This procedure takes approximately one hour. There are no restrictions for this procedure.  Date & Time: ________________________________________________________________  Your physician has recommended that you wear a holter monitor. Holter monitors are medical devices that record the heart's electrical activity. Doctors most often use these monitors to diagnose arrhythmias. Arrhythmias are problems with the speed or rhythm of the heartbeat. The monitor is a small, portable device. You can wear one while you do your normal daily activities. This is usually used to diagnose what is causing palpitations/syncope (passing out).  Date & Time: _________________________________________________________________  Follow-Up: Your physician recommends that you schedule a follow-up appointment after testing to review results.  Date & Time: _________________________________________________________________   Any Other Special Instructions Will Be Listed Below (If Applicable).     If you need a refill on your cardiac medications before your next appointment, please call your pharmacy.  Echocardiogram An echocardiogram, or echocardiography, uses sound waves (ultrasound) to produce an image of your heart. The echocardiogram is simple, painless, obtained within a short period of time, and offers valuable information to your health care provider. The images from an echocardiogram can provide information  such as:  Evidence of coronary artery disease (CAD).  Heart size.  Heart muscle function.  Heart valve function.  Aneurysm detection.  Evidence of a past heart attack.  Fluid buildup around the heart.  Heart muscle thickening.  Assess heart valve function. LET [facility] CARE PROVIDER KNOW ABOUT:  Any allergies you have.  All medicines you are taking, including vitamins, herbs, eye drops, creams, and over-the-counter medicines.  Previous problems you or members of your family have had with the use of anesthetics.  Any blood disorders you have.  Previous surgeries you have had.  Medical conditions you have.  Possibility of pregnancy, if this applies. BEFORE THE PROCEDURE  No special preparation is needed. Eat and drink normally.  PROCEDURE   In order to produce an image of your heart, gel will be applied to your chest and a wand-like tool (transducer) will be moved over your chest. The gel will help transmit the sound waves from the transducer. The sound waves will harmlessly bounce off your heart to allow the heart images to be captured in real-time motion. These images will then be recorded.  You may need an IV to receive a medicine that improves the quality of the pictures. AFTER THE PROCEDURE You may return to your normal schedule including diet, activities, and medicines, unless your health care provider tells you otherwise.   This information is not intended to replace advice given to you by your health care provider. Make sure you discuss any questions you have with your health care provider.   Document Released: 10/31/2000 Document Revised: 11/24/2014 Document Reviewed: 07/11/2013 Elsevier Interactive Patient Education 2016 Elsevier Inc.   Holter Monitoring A Holter monitor is a small device that is used to detect abnormal heart rhythms. It clips to your clothing and is connected by wires to flat, sticky disks (electrodes) that attach to your chest. It is  worn continuously for 24-48 hours. HOME CARE INSTRUCTIONS  Wear your Holter monitor at all times, even while exercising and sleeping, for as long as directed by your health care provider.  Make sure that the Holter monitor is safely clipped to your clothing or close to your body as recommended by your health care provider.  Do not get the monitor or wires wet.  Do not put body lotion or moisturizer on your chest.  Keep your skin clean.  Keep a diary of your daily activities, such as walking and doing chores. If you feel that your heartbeat is abnormal or that your heart is fluttering or skipping a beat:  Record what you are doing when it happens.  Record what time of day the symptoms occur.  Return your Holter monitor as directed by your health care provider.  Keep all follow-up visits as directed by your health care provider. This is important. SEEK IMMEDIATE MEDICAL CARE IF:  You feel lightheaded or you faint.  You have trouble breathing.  You feel pain in your chest, upper arm, or jaw.  You feel sick to your stomach and your skin is pale, cool, or damp.  You heartbeat feels unusual or abnormal.   This information is not intended to replace advice given to you by your health care provider. Make sure you discuss any questions you have with your health care provider.   Document Released: 08/01/2004 Document Revised: 11/24/2014 Document Reviewed: 06/12/2014 Elsevier Interactive Patient Education [employer].

## 2016-03-26 ENCOUNTER — Ambulatory Visit: Payer: Medicare Other | Admitting: Family Medicine

## 2016-03-28 ENCOUNTER — Ambulatory Visit: Payer: Medicare Other | Admitting: Family Medicine

## 2016-04-09 ENCOUNTER — Ambulatory Visit (INDEPENDENT_AMBULATORY_CARE_PROVIDER_SITE_OTHER): Payer: Medicare Other | Admitting: Family Medicine

## 2016-04-09 ENCOUNTER — Encounter: Payer: Self-pay | Admitting: Family Medicine

## 2016-04-09 VITALS — BP 110/60 | HR 101 | Temp 98.7°F | Resp 14 | Ht 64.0 in | Wt 85.3 lb

## 2016-04-09 DIAGNOSIS — R000 Tachycardia, unspecified: Secondary | ICD-10-CM

## 2016-04-09 DIAGNOSIS — I6930 Unspecified sequelae of cerebral infarction: Secondary | ICD-10-CM

## 2016-04-09 DIAGNOSIS — I10 Essential (primary) hypertension: Secondary | ICD-10-CM

## 2016-04-09 NOTE — Progress Notes (Signed)
Name: Kaitlyn Gonzalez   MRN: [id number]    DOB: 1936/03/21   Date:04/09/2016       Progress Note  Subjective  Chief Complaint  Chief Complaint  Patient presents with  . Follow-up    heart rate    HPI  Hypertension: Kaitlyn Gonzalez returns for follow-up of hypertension, presently on amlodipine and metoprolol, blood pressure is at goal, reports no associated concerning symptoms.  Past Medical History  Diagnosis Date  . Hypertension   . Dyslipidemia   . Peripheral vascular disease ([place])   . History of stroke with residual effects     Stroke in April 2016.  . Legally blind in right eye, as defined in [place]     Past Surgical History  Procedure Laterality Date  . Peripheral arterial stent graft Left 2010    Family History  Problem Relation Age of Onset  . Diabetes Sister   . Cancer Sister     breast  . Alzheimer's disease Mother   . AAA (abdominal aortic aneurysm) Father     Social History   Social History  . Marital Status: Divorced    Spouse Name: N/A  . Number of Children: N/A  . Years of Education: N/A   Occupational History  . Not on file.   Social History Main Topics  . Smoking status: Current Every Day Smoker -- 0.50 packs/day    Types: Cigarettes  . Smokeless tobacco: Never Used  . Alcohol Use: No  . Drug Use: No  . Sexual Activity: No   Other Topics Concern  . Not on file   Social History Narrative     Current outpatient prescriptions:  .  amLODipine (NORVASC) 5 MG tablet, Take 1 tablet (5 mg total) by mouth daily., Disp: 90 tablet, Rfl: 1 .  metoprolol tartrate (LOPRESSOR) 25 MG tablet, Take 1 tablet (25 mg total) by mouth 2 (two) times daily., Disp: 180 tablet, Rfl: 1 .  simvastatin (ZOCOR) 20 MG tablet, Take 1 tablet (20 mg total) by mouth at bedtime., Disp: 90 tablet, Rfl: 1  No Known Allergies   Review of Systems  Constitutional: Negative for fever and chills.  Respiratory: Negative for cough and shortness of breath.   Cardiovascular:  Negative for chest pain and palpitations.    Objective  Filed Vitals:   04/09/16 1442  BP: 110/60  Pulse: 101  Temp: 98.7 F (37.1 C)  TempSrc: Oral  Resp: 14  Height: 5\' 4"  (1.626 m)  Weight: 85 lb 4.8 oz (38.692 kg)  SpO2: 97%    Physical Exam  Constitutional: She is oriented to person, place, and time and well-developed, well-nourished, and in no distress.  HENT:  Head: Normocephalic and atraumatic.  Cardiovascular: Regular rhythm.  Tachycardia present.   Pulmonary/Chest: Breath sounds normal.  Neurological: She is alert and oriented to person, place, and time.  Psychiatric: Mood, memory, affect and judgment normal.  Nursing note and vitals reviewed.     Assessment & Plan  1. Essential hypertension BP stable, continue present therapy.  2. History of stroke with residual effects As not seen a neurologist up after hospital discharge. Advised to start taking aspirin daily 1 mg daily. - Ambulatory referral to Neurology  3. Tachycardia with 100 - 120 beats per minute Elevated heart rate, rule out potential etiologies obtain labs - CBC with Differential - TSH - Comprehensive Metabolic Panel (CMET)    Octavius Shin [facility] 04/09/2016 2:55 PM

## 2016-04-10 LAB — COMPREHENSIVE METABOLIC PANEL
ALBUMIN: 4.7 g/dL (ref 3.5–4.8)
ALT: 7 IU/L (ref 0–32)
AST: 10 IU/L (ref 0–40)
Albumin/Globulin Ratio: 1.4 (ref 1.2–2.2)
Alkaline Phosphatase: 71 IU/L (ref 39–117)
BILIRUBIN TOTAL: 0.2 mg/dL (ref 0.0–1.2)
BUN / CREAT RATIO: 16 (ref 12–28)
BUN: 10 mg/dL (ref 8–27)
CALCIUM: 9.6 mg/dL (ref 8.7–10.3)
CHLORIDE: 100 mmol/L (ref 96–106)
CO2: 21 mmol/L (ref 18–29)
Creatinine, Ser: 0.64 mg/dL (ref 0.57–1.00)
GFR, EST AFRICAN AMERICAN: 100 mL/min/{1.73_m2} (ref >59)
GFR, EST NON AFRICAN AMERICAN: 86 mL/min/{1.73_m2} (ref >59)
GLUCOSE: 120 mg/dL — AB (ref 65–99)
Globulin, Total: 3.3 g/dL (ref 1.5–4.5)
Potassium: 3.8 mmol/L (ref 3.5–5.2)
Sodium: 145 mmol/L — ABNORMAL HIGH (ref 134–144)
TOTAL PROTEIN: 8 g/dL (ref 6.0–8.5)

## 2016-04-10 LAB — CBC WITH DIFFERENTIAL/PLATELET
BASOS ABS: 0 10*3/uL (ref 0.0–0.2)
Basos: 0 %
EOS (ABSOLUTE): 0.1 10*3/uL (ref 0.0–0.4)
Eos: 1 %
Hematocrit: 36 % (ref 34.0–46.6)
Hemoglobin: 12 g/dL (ref 11.1–15.9)
Immature Grans (Abs): 0 10*3/uL (ref 0.0–0.1)
Immature Granulocytes: 0 %
LYMPHS ABS: 2 10*3/uL (ref 0.7–3.1)
Lymphs: 26 %
MCH: 29.1 pg (ref 26.6–33.0)
MCHC: 33.3 g/dL (ref 31.5–35.7)
MCV: 87 fL (ref 79–97)
MONOCYTES: 6 %
Monocytes Absolute: 0.5 10*3/uL (ref 0.1–0.9)
Neutrophils Absolute: 5.2 10*3/uL (ref 1.4–7.0)
Neutrophils: 67 %
Platelets: 353 10*3/uL (ref 150–379)
RBC: 4.13 x10E6/uL (ref 3.77–5.28)
RDW: 15.8 % — AB (ref 12.3–15.4)
WBC: 7.7 10*3/uL (ref 3.4–10.8)

## 2016-04-10 LAB — TSH: TSH: 2.39 u[IU]/mL (ref 0.450–4.500)

## 2016-04-23 ENCOUNTER — Ambulatory Visit (INDEPENDENT_AMBULATORY_CARE_PROVIDER_SITE_OTHER): Payer: Medicare Other

## 2016-04-23 ENCOUNTER — Other Ambulatory Visit: Payer: Self-pay

## 2016-04-23 DIAGNOSIS — R011 Cardiac murmur, unspecified: Secondary | ICD-10-CM | POA: Diagnosis not present

## 2016-04-30 ENCOUNTER — Encounter: Payer: Self-pay | Admitting: Cardiology

## 2016-04-30 ENCOUNTER — Ambulatory Visit (INDEPENDENT_AMBULATORY_CARE_PROVIDER_SITE_OTHER): Payer: Medicare Other | Admitting: Cardiology

## 2016-04-30 ENCOUNTER — Ambulatory Visit
Admission: RE | Admit: 2016-04-30 | Discharge: 2016-04-30 | Disposition: A | Discharge status: Home / Self Care | Payer: Medicare Other | Source: Ambulatory Visit | Attending: Cardiology | Admitting: Cardiology

## 2016-04-30 VITALS — BP 142/82 | HR 88 | Ht 66.0 in | Wt 86.8 lb

## 2016-04-30 DIAGNOSIS — I493 Ventricular premature depolarization: Secondary | ICD-10-CM | POA: Diagnosis not present

## 2016-04-30 DIAGNOSIS — R011 Cardiac murmur, unspecified: Secondary | ICD-10-CM | POA: Diagnosis not present

## 2016-04-30 DIAGNOSIS — R9431 Abnormal electrocardiogram [ECG] [EKG]: Secondary | ICD-10-CM | POA: Diagnosis not present

## 2016-04-30 DIAGNOSIS — I739 Peripheral vascular disease, unspecified: Secondary | ICD-10-CM

## 2016-04-30 DIAGNOSIS — F17200 Nicotine dependence, unspecified, uncomplicated: Secondary | ICD-10-CM | POA: Diagnosis not present

## 2016-04-30 DIAGNOSIS — I340 Nonrheumatic mitral (valve) insufficiency: Secondary | ICD-10-CM | POA: Diagnosis not present

## 2016-04-30 DIAGNOSIS — I639 Cerebral infarction, unspecified: Secondary | ICD-10-CM | POA: Diagnosis present

## 2016-04-30 DIAGNOSIS — Z8673 Personal history of transient ischemic attack (TIA), and cerebral infarction without residual deficits: Secondary | ICD-10-CM | POA: Diagnosis not present

## 2016-04-30 DIAGNOSIS — I10 Essential (primary) hypertension: Secondary | ICD-10-CM | POA: Diagnosis not present

## 2016-04-30 NOTE — Progress Notes (Signed)
Cardiology Office Note   Date:  04/30/2016   ID:  Kaitlyn Gonzalez, DOB 06-Feb-1936, MRN [id number]  Referring Doctor:  Keith Rake, MD   Cardiologist:   Wende Bushy, MD   Reason for consultation:  Chief Complaint  Patient presents with  . other    Follow up from Echo and 24 hour holter monitor. Meds reviewed by the patient verbally.       History of Present Illness: Kaitlyn Gonzalez is a 80 y.o. female who presents for  Evaluation of murmur, Here for follow-up after tests.     Patient does not complain of any chest pain, shortness of breath. She is able to walk a flight of stairs without any chest pains or shortness of breath. No palpitations. No lightheadedness or dizziness. No PND, orthopnea, edema.   She continues to work on quitting smoking altogether.   ROS:  Please see the history of present illness. Aside from mentioned under HPI, all other systems are reviewed and negative.     Past Medical History  Diagnosis Date  . Hypertension   . Dyslipidemia   . Peripheral vascular disease ([place])   . History of stroke with residual effects     Stroke in April 2016.  . Legally blind in right eye, as defined in [place]     Past Surgical History  Procedure Laterality Date  . Peripheral arterial stent graft Left 2010     reports that she has been smoking Cigarettes.  She has been smoking about 0.25 packs per day. She has never used smokeless tobacco. She reports that she does not drink alcohol or use illicit drugs.   family history includes AAA (abdominal aortic aneurysm) in her father; Alzheimer's disease in her mother; Cancer in her sister; Diabetes in her sister.   Current Outpatient Prescriptions  Medication Sig Dispense Refill  . amLODipine (NORVASC) 5 MG tablet Take 1 tablet (5 mg total) by mouth daily. 90 tablet 1  . metoprolol tartrate (LOPRESSOR) 25 MG tablet Take 1 tablet (25 mg total) by mouth 2 (two) times daily. 180 tablet 1  . simvastatin (ZOCOR) 20 MG  tablet Take 1 tablet (20 mg total) by mouth at bedtime. 90 tablet 1   No current facility-administered medications for this visit.    She is supposed to takeaspirin 81 mg by mouth daily although patient does not regularly take this  Allergies: Review of patient's allergies indicates no known allergies.    PHYSICAL EXAM: VS:  BP 142/82 mmHg  Pulse 88  Ht 5\' 6"  (1.676 m)  Wt 86 lb 12 oz (39.35 kg)  BMI 14.01 kg/m2  SpO2 98% , Body mass index is 14.01 kg/(m^2). Wt Readings from Last 3 Encounters:  04/30/16 86 lb 12 oz (39.35 kg)  04/09/16 85 lb 4.8 oz (38.692 kg)  03/25/16 87 lb (39.463 kg)    GENERAL:  well developed,  Thin appearing, not in acute distress HEENT: normocephalic, pink conjunctivae, anicteric sclerae, no xanthelasma, normal dentition, oropharynx clear NECK:  no neck vein engorgement, JVP normal, no hepatojugular reflux, carotid upstroke brisk and symmetric, no bruit, no thyromegaly, no lymphadenopathy LUNGS:  good respiratory effort, clear to auscultation bilaterally CV:  PMI not displaced, no thrills, no lifts, S1 and S2 within normal limits, no palpable S3 or S4,  Systolic murmur , 2 out of 6, nonradiating, no rubs, no gallops ABD:  Soft, nontender, nondistended, normoactive bowel sounds, no abdominal aortic bruit, no hepatomegaly, no splenomegaly MS: nontender back, no  kyphosis, no scoliosis, no joint deformities EXT:   1+ DP/PT pulses, no edema, no varicosities, no cyanosis, no clubbing SKIN: warm, nondiaphoretic, normal turgor, no ulcers NEUROPSYCH: alert, oriented to person, place, and time, sensory/motor grossly intact, normal mood, appropriate affect  Recent Labs: 04/09/2016: ALT 7; BUN 10; Creatinine, Ser 0.64; Platelets 353; Potassium 3.8; Sodium 145*; TSH 2.390   Lipid Panel    Component Value Date/Time   CHOL 191 02/15/2016 1144   CHOL 167 03/13/2015 0339   TRIG 62 02/15/2016 1144   TRIG 82 03/13/2015 0339   HDL 58 02/15/2016 1144   HDL 52 03/13/2015  0339   CHOLHDL 3.3 02/15/2016 1144   VLDL 16 03/13/2015 0339   LDLCALC 121* 02/15/2016 1144   LDLCALC 99 03/13/2015 0339     Other studies Reviewed:  EKG:  The ekg from  03/25/2016 was personally reviewed by me and it revealed  Sinus rhythm, 81 BPM. PACs and PVC. Nonspecific ST-T wave changes.  Additional studies/ records that were reviewed personally reviewed by me today include:   Echo 04/23/2016: Left ventricle: The cavity size was normal. There was mild  concentric hypertrophy. Systolic function was normal. The  estimated ejection fraction was in the range of 60% to 65%. Wall  motion was normal; there were no regional wall motion  abnormalities. Doppler parameters are consistent with abnormal  left ventricular relaxation (grade 1 diastolic dysfunction). - Mitral valve: Mildly calcified annulus. There was mild  regurgitation. - Pulmonary arteries: Systolic pressure was within the normal  range.  Holter 04/23/2016: Overall rhythm was sinus. The heart rate ranged from 66-112 bpm, average of 84 BPM.  No high grade supraventricular ectopy: 273 isolated PACs, one atrial couplet.   No high-grade ventricular ectopy: 138 isolated PVCs. 8 in ventricular bigeminy.  No evidence of AV conduction disease. No evidence of atrial fibrillation.       ASSESSMENT AND PLAN:  systolic murmur  echocardiogram reveals mild mitral regurgitation. Blood pressure control recommended. May reevaluate with echo in 2 years. Murmur is probably more audible because of patient's size.  Mitral annular calcification noted. Recommend aspirin 81 mg by mouth daily.   PACs/PVCs  Patient unaware of ectopic beats. 24-hour Holter monitor showed PACs, PVCs but no high-grade supraventricular or ventricular ectopy. Findings discussed with patient. Patient already on metoprolol for blood pressure control.   Peripheral arterial disease  Encouraged patient to take aspirin 81 milligrams by mouth daily.   Agree with Zocor. LDL goal is less than 70. PCP following labs.   Hypertension Continue monitoring BP. Continue current medical therapy and lifestyle changes.   Tobacco use disorder We discussed the importance of smoking cessation and different strategies for quitting.    Current medicines are reviewed at length with the patient today.  The patient does not have concerns regarding medicines.  Labs/ tests ordered today include:  No orders of the defined types were placed in this encounter.    I had a lengthy and detailed discussion with the patient regarding diagnoses, prognosis, diagnostic options, treatment options .   I counseled the patient on importance of lifestyle modification including heart healthy diet, regular physical activity , and smoking cessation.   Disposition:   FU with undersigned in 2 years     Signed, Wende Bushy, MD  04/30/2016 10:12 AM    [place]

## 2016-04-30 NOTE — Patient Instructions (Signed)
Medication Instructions:  Your physician recommends that you continue on your current medications as directed. Please refer to the Current Medication list given to you today.   Labwork: None ordered  Testing/Procedures: None ordered  Follow-Up: Your physician wants you to follow-up in: 2 years with Dr. Yvone Neu. You will receive a reminder letter in the mail two months in advance. If you don't receive a letter, please call our office to schedule the follow-up appointment.   Any Other Special Instructions Will Be Listed Below (If Applicable).     If you need a refill on your cardiac medications before your next appointment, please call your pharmacy.

## 2016-06-10 ENCOUNTER — Ambulatory Visit: Payer: Medicare Other | Admitting: Family Medicine

## 2016-06-11 ENCOUNTER — Ambulatory Visit (INDEPENDENT_AMBULATORY_CARE_PROVIDER_SITE_OTHER): Payer: Medicare Other | Admitting: Family Medicine

## 2016-06-11 ENCOUNTER — Encounter: Payer: Self-pay | Admitting: Family Medicine

## 2016-06-11 VITALS — BP 130/70 | HR 97 | Temp 98.8°F | Resp 16 | Ht 66.0 in | Wt 87.1 lb

## 2016-06-11 DIAGNOSIS — E785 Hyperlipidemia, unspecified: Secondary | ICD-10-CM | POA: Diagnosis not present

## 2016-06-11 DIAGNOSIS — R011 Cardiac murmur, unspecified: Secondary | ICD-10-CM | POA: Diagnosis not present

## 2016-06-11 DIAGNOSIS — I10 Essential (primary) hypertension: Secondary | ICD-10-CM

## 2016-06-11 NOTE — Progress Notes (Signed)
Name: Kaitlyn Gonzalez   MRN: [id number]    DOB: [birth year]   Date:06/11/2016       Progress Note  Subjective  Chief Complaint  Chief Complaint  Patient presents with  . Hyperlipidemia    follow up  . Hypertension    Hyperlipidemia  This is a chronic problem. The problem is controlled. Pertinent negatives include no chest pain or shortness of breath. Current antihyperlipidemic treatment includes statins.  Hypertension  This is a chronic problem. The problem is unchanged. Pertinent negatives include no blurred vision, chest pain, headaches, palpitations or shortness of breath. Past treatments include calcium channel blockers and beta blockers.      Past Medical History:  Diagnosis Date  . Dyslipidemia   . History of stroke with residual effects    Stroke in April 2016.  Marland Kitchen Hypertension   . Legally blind in right eye, as defined in [place]   . Peripheral vascular disease [facility])     Past Surgical History:  Procedure Laterality Date  . PERIPHERAL ARTERIAL STENT GRAFT Left 2010    Family History  Problem Relation Age of Onset  . Diabetes Sister   . Cancer Sister     breast  . Alzheimer's disease Mother   . AAA (abdominal aortic aneurysm) Father     Social History   Social History  . Marital status: Divorced    Spouse name: N/A  . Number of children: N/A  . Years of education: N/A   Occupational History  . Not on file.   Social History Main Topics  . Smoking status: Current Every Day Smoker    Packs/day: 0.25    Types: Cigarettes  . Smokeless tobacco: Never Used  . Alcohol use No  . Drug use: No  . Sexual activity: No   Other Topics Concern  . Not on file   Social History Narrative  . No narrative on file     Current Outpatient Prescriptions:  .  amLODipine (NORVASC) 5 MG tablet, Take 1 tablet (5 mg total) by mouth daily., Disp: 90 tablet, Rfl: 1 .  metoprolol tartrate (LOPRESSOR) 25 MG tablet, Take 1 tablet (25 mg total) by mouth 2 (two) times  daily., Disp: 180 tablet, Rfl: 1 .  simvastatin (ZOCOR) 20 MG tablet, Take 1 tablet (20 mg total) by mouth at bedtime., Disp: 90 tablet, Rfl: 1  No Known Allergies   Review of Systems  Eyes: Negative for blurred vision.  Respiratory: Negative for shortness of breath.   Cardiovascular: Negative for chest pain and palpitations.  Neurological: Negative for headaches.    Objective  Vitals:   06/11/16 1422  BP: 130/70  Pulse: 97  Resp: 16  Temp: 98.8 F (37.1 C)  TempSrc: Oral  SpO2: 95%  Weight: 87 lb 1.6 oz (39.5 kg)  Height: 5\' 6"  (1.676 m)    Physical Exam  Constitutional: She is oriented to person, place, and time and well-developed, well-nourished, and in no distress.  HENT:  Head: Normocephalic and atraumatic.  Cardiovascular: Normal rate, regular rhythm, S1 normal and S2 normal.   Murmur heard.  Systolic murmur is present with a grade of 2/6  Pulmonary/Chest: Effort normal and breath sounds normal. She has no wheezes.  Musculoskeletal:       Right ankle: She exhibits no swelling.       Left ankle: She exhibits no swelling.  Neurological: She is alert and oriented to person, place, and time.  Psychiatric: Mood, memory, affect and judgment normal.  Nursing  note and vitals reviewed.      Assessment & Plan  1. Essential hypertension BP stable on present and hypertensive to  2. Dyslipidemia  - Lipid Profile  3. Heart murmur on physical examination Being followed by cardiology. Notes reviewed.   Crysten Kaman [facility] 06/11/2016 2:32 PM

## 2016-06-13 LAB — LIPID PANEL
CHOL/HDL RATIO: 3.2 ratio (ref <=5.0)
Cholesterol: 177 mg/dL (ref 125–200)
HDL: 55 mg/dL (ref >=46)
LDL CALC: 97 mg/dL (ref <130)
Triglycerides: 124 mg/dL (ref <150)
VLDL: 25 mg/dL (ref <30)

## 2016-06-25 ENCOUNTER — Ambulatory Visit (INDEPENDENT_AMBULATORY_CARE_PROVIDER_SITE_OTHER): Payer: Medicare Other | Admitting: Neurology

## 2016-06-25 ENCOUNTER — Encounter: Payer: Self-pay | Admitting: Neurology

## 2016-06-25 VITALS — BP 138/78 | HR 97 | Ht 66.0 in | Wt 87.0 lb

## 2016-06-25 DIAGNOSIS — I6330 Cerebral infarction due to thrombosis of unspecified cerebral artery: Secondary | ICD-10-CM | POA: Diagnosis not present

## 2016-06-25 DIAGNOSIS — E785 Hyperlipidemia, unspecified: Secondary | ICD-10-CM

## 2016-06-25 DIAGNOSIS — I739 Peripheral vascular disease, unspecified: Secondary | ICD-10-CM | POA: Diagnosis not present

## 2016-06-25 DIAGNOSIS — I10 Essential (primary) hypertension: Secondary | ICD-10-CM | POA: Diagnosis not present

## 2016-06-25 DIAGNOSIS — I779 Disorder of arteries and arterioles, unspecified: Secondary | ICD-10-CM

## 2016-06-25 MED ORDER — ATORVASTATIN CALCIUM 20 MG PO TABS: 20.0000 mg | ORAL_TABLET | Freq: Every day | ORAL | 5 refills | Status: DC

## 2016-06-25 NOTE — Patient Instructions (Signed)
1.  Continue aspirin 81mg  daily 2.  Stop simvastatin.  Instead, we will start atorvastatin 20mg  daily (to lower cholesterol).  We will recheck fasting lipid panel in 3 months. 3.  Check carotid doppler 4.  Follow up in one year

## 2016-06-25 NOTE — Progress Notes (Signed)
NEUROLOGY CONSULTATION NOTE  Kaitlyn Gonzalez MRN: [id number] DOB: 1936-06-25  Referring provider: Dr. Manuella Ghazi Primary care provider: Dr. Manuella Ghazi  Reason for consult:  stroke  HISTORY OF PRESENT ILLNESS: Kaitlyn Gonzalez is a 80 year old right-handed woman with hypertension, hyperlipidemia, peripheral vascular disease and legally blind in right eye who presents for stroke.  History obtained by patient, her son, and hospital notes.  Imaging of brain CT, MRI, MRA and MRA of neck personally reviewed.  She was admitted to [facility] from 03/12/15 to 03/13/15 for acute stroke.  At that time, she presented with left sided weakness.  CT of head was negative for acute changes.  MRI of brain demonstrated acute ischemic infarct in the right centrum semiovale.  MRA of head showed 2 mm partially thrombosed left cavernous carotid artery aneurysm but no significant intracranial stenosis.  MRA of neck showed 65% proximal right ICA stenosis and 50% proximal left ICA stenosis.  Hgb A1c was 5.3.  Lipid panel showed LDL of 99.  Echo showed EF 60-65% with no cardiac source of embolus.  She was not taking any medications at the time and was started on ASA 81mg  daily, simvastatin 20mg  daily, amlodipine and metoprolol.  She continues to have some left sided weakness.  She has some pain and weakness in legs due to peripheral vascular disease.  Recent lipid panel from 06/11/16 showed cholesterol 177, TG 124, HDL 55 and LDL 97.  LFTs demonstrated TB 0.2, ALP 71, AST 10 and ALT 7. This past year, she has been evaluated by cardiology for systolic murmur.  She had a 24 hour Holter monitor in June, which was unremarkable.  Echo again demonstrated EF 60-65% with grade 1 diastolic dysfunction.  PAST MEDICAL HISTORY: Past Medical History:  Diagnosis Date  . Dyslipidemia   . History of stroke with residual effects    Stroke in April 2016.  Marland Kitchen Hypertension   . Legally blind in right eye, as defined in [place]   . Peripheral  vascular disease ([place])     PAST SURGICAL HISTORY: Past Surgical History:  Procedure Laterality Date  . PERIPHERAL ARTERIAL STENT GRAFT Left 2010    MEDICATIONS: Current Outpatient Prescriptions on File Prior to Visit  Medication Sig Dispense Refill  . amLODipine (NORVASC) 5 MG tablet Take 1 tablet (5 mg total) by mouth daily. 90 tablet 1  . metoprolol tartrate (LOPRESSOR) 25 MG tablet Take 1 tablet (25 mg total) by mouth 2 (two) times daily. 180 tablet 1   No current facility-administered medications on file prior to visit.     ALLERGIES: No Known Allergies  FAMILY HISTORY: Family History  Problem Relation Age of Onset  . Diabetes Sister   . Cancer Sister     breast  . Alzheimer's disease Mother   . Dementia Mother   . AAA (abdominal aortic aneurysm) Father     SOCIAL HISTORY: Social History   Social History  . Marital status: Divorced    Spouse name: N/A  . Number of children: N/A  . Years of education: N/A   Occupational History  . Not on file.   Social History Main Topics  . Smoking status: Current Every Day Smoker    Packs/day: 0.25    Types: Cigarettes  . Smokeless tobacco: Never Used  . Alcohol use No  . Drug use: No  . Sexual activity: No   Other Topics Concern  . Not on file   Social History Narrative  . No narrative on file  REVIEW OF SYSTEMS: Constitutional: No fevers, chills, or sweats, no generalized fatigue, change in appetite Eyes: blind in right eye Ear, nose and throat: No hearing loss, ear pain, nasal congestion, sore throat Cardiovascular: No chest pain, palpitations Respiratory:  No shortness of breath at rest or with exertion, wheezes GastrointestinaI: No nausea, vomiting, diarrhea, abdominal pain, fecal incontinence Genitourinary:  No dysuria, urinary retention or frequency Musculoskeletal:  No neck pain, back pain Integumentary: No rash, pruritus, skin lesions Neurological: as above Psychiatric: No depression, insomnia,  anxiety Endocrine: No palpitations, fatigue, diaphoresis, mood swings, change in appetite, change in weight, increased thirst Hematologic/Lymphatic:  No purpura, petechiae. Allergic/Immunologic: no itchy/runny eyes, nasal congestion, recent allergic reactions, rashes  PHYSICAL EXAM: Vitals:   06/25/16 1500  BP: 138/78  Pulse: 97   General: No acute distress.  Patient appears well-groomed.  Head:  Normocephalic/atraumatic Eyes:  fundi examined but not visualized Neck: supple, no paraspinal tenderness, full range of motion Back: No paraspinal tenderness Heart: regular rate and rhythm Lungs: Clear to auscultation bilaterally. Vascular: No carotid bruits. Neurological Exam: Mental status: alert and oriented to person, place, and time, recent and remote memory intact, fund of knowledge intact, attention and concentration intact, speech fluent and not dysarthric, language intact. Cranial nerves: CN I: not tested CN II: Right eye scarring.  Left pupil round and reactive to light, blind in right eye CN III, IV, VI:  full range of motion, no nystagmus, no ptosis CN V: facial sensation intact CN VII: upper and lower face symmetric CN VIII: hearing intact CN IX, X: gag intact, uvula midline CN XI: sternocleidomastoid and trapezius muscles intact CN XII: tongue midline Bulk & Tone: normal, no fasciculations. Motor:  4-/5 left upper and lower extremities.  5/5 right side. Sensation: temperature sensation intact and vibration sensation reduced in left foot. Deep Tendon Reflexes:  2+ throughout, toes downgoing. Finger to nose testing:  Without dysmetria.  Heel to shin:  Without dysmetria.  Gait:  Slowed cautious gait.  Able to turn but not tandem walk. Romberg negative.  IMPRESSION: CVA Bilateral carotid artery stenosis HTN Hyperlipidemia Peripheral vascular disease  PLAN: 1.  Continue asa 81mg  daily for secondary stroke prevention 2.  Change simvastatin to atorvastatin 20mg  daily  (LDL goal should be less than 70).  Repeat fasting lipid panel and LFTs in 3 months. 3.  Blood pressure control 4.  Repeat carotid doppler.  She should have annual carotid dopplers to monitor carotid stenosis 5.  Follow up in one year.  Thank you for allowing me to take part in the care of this patient.  Metta Clines, DO  CC:  Keith Rake, MD

## 2016-08-07 DIAGNOSIS — M5442 Lumbago with sciatica, left side: Secondary | ICD-10-CM | POA: Diagnosis not present

## 2016-08-26 ENCOUNTER — Other Ambulatory Visit: Payer: Self-pay

## 2016-08-26 MED ORDER — ATORVASTATIN CALCIUM 20 MG PO TABS: 20.0000 mg | ORAL_TABLET | Freq: Every day | ORAL | 3 refills | Status: DC

## 2016-09-09 ENCOUNTER — Telehealth: Payer: Self-pay

## 2016-09-09 DIAGNOSIS — I739 Peripheral vascular disease, unspecified: Secondary | ICD-10-CM

## 2016-09-09 DIAGNOSIS — E785 Hyperlipidemia, unspecified: Secondary | ICD-10-CM

## 2016-09-09 NOTE — Telephone Encounter (Signed)
Opened in error

## 2016-09-09 NOTE — Telephone Encounter (Signed)
-----   Message from Amada Kingfisher, [place] sent at 06/25/2016  3:22 PM EDT ----- Fasting lipid, LFT

## 2016-09-10 NOTE — Telephone Encounter (Signed)
Left message on machine for pt to return call to the office.  

## 2016-09-10 NOTE — Telephone Encounter (Signed)
Message given to son Eloy End. Will bring pt in to have labs done.

## 2016-09-17 ENCOUNTER — Other Ambulatory Visit (INDEPENDENT_AMBULATORY_CARE_PROVIDER_SITE_OTHER): Payer: Medicare Other

## 2016-09-17 DIAGNOSIS — E785 Hyperlipidemia, unspecified: Secondary | ICD-10-CM

## 2016-09-17 DIAGNOSIS — I739 Peripheral vascular disease, unspecified: Secondary | ICD-10-CM | POA: Diagnosis not present

## 2016-09-17 LAB — LIPID PANEL
CHOL/HDL RATIO: 3
CHOLESTEROL: 180 mg/dL (ref 0–200)
HDL: 58.5 mg/dL (ref >39.00)
LDL Cholesterol: 107 mg/dL — ABNORMAL HIGH (ref 0–99)
NonHDL: 121.37
TRIGLYCERIDES: 73 mg/dL (ref 0.0–149.0)
VLDL: 14.6 mg/dL (ref 0.0–40.0)

## 2016-09-17 LAB — HEPATIC FUNCTION PANEL
ALBUMIN: 4.1 g/dL (ref 3.5–5.2)
ALT: 5 U/L (ref 0–35)
AST: 9 U/L (ref 0–37)
Alkaline Phosphatase: 57 U/L (ref 39–117)
BILIRUBIN DIRECT: 0.1 mg/dL (ref 0.0–0.3)
TOTAL PROTEIN: 7.5 g/dL (ref 6.0–8.3)
Total Bilirubin: 0.3 mg/dL (ref 0.2–1.2)

## 2016-09-18 ENCOUNTER — Telehealth: Payer: Self-pay

## 2016-09-18 MED ORDER — ATORVASTATIN CALCIUM 40 MG PO TABS: 40.0000 mg | ORAL_TABLET | Freq: Every day | ORAL | 3 refills | Status: DC

## 2016-09-18 NOTE — Telephone Encounter (Signed)
-----   Message from Pieter Partridge, DO sent at 09/18/2016  7:21 AM EDT ----- LDL cholesterol is unchanged.  Last visit, we switched her to atorvastatin 20mg  daily from simvastatin.  If we confirm she is taking that, then I would like to increase dose of atorvastatin to 40mg  daily and repeat fasting lipid panel in another 3 months.

## 2016-09-18 NOTE — Telephone Encounter (Signed)
Spoke with son, [name]. Results relayed. Verified pt was on 20 mg of atorvastatin. New rx for 40 mg sent to pharmacy. Advised son that they can douple up on dosage until new Rx is picked up.

## 2016-12-05 DIAGNOSIS — M7062 Trochanteric bursitis, left hip: Secondary | ICD-10-CM | POA: Diagnosis not present

## 2016-12-09 ENCOUNTER — Encounter: Payer: Self-pay | Admitting: Neurology

## 2017-02-05 ENCOUNTER — Telehealth: Payer: Self-pay

## 2017-02-05 NOTE — Telephone Encounter (Signed)
Called to schedule AWV, no answer left message, ANR

## 2017-02-16 ENCOUNTER — Encounter: Payer: Self-pay | Admitting: Family Medicine

## 2017-02-16 ENCOUNTER — Ambulatory Visit (INDEPENDENT_AMBULATORY_CARE_PROVIDER_SITE_OTHER): Payer: Medicare Other | Admitting: Family Medicine

## 2017-02-16 VITALS — BP 120/70 | HR 81 | Temp 98.2°F | Resp 16 | Ht 66.0 in | Wt 89.5 lb

## 2017-02-16 DIAGNOSIS — E785 Hyperlipidemia, unspecified: Secondary | ICD-10-CM

## 2017-02-16 DIAGNOSIS — I10 Essential (primary) hypertension: Secondary | ICD-10-CM | POA: Diagnosis not present

## 2017-02-16 DIAGNOSIS — R739 Hyperglycemia, unspecified: Secondary | ICD-10-CM

## 2017-02-16 LAB — LIPID PANEL
CHOL/HDL RATIO: 2.2 ratio (ref <5.0)
CHOLESTEROL: 150 mg/dL (ref <200)
HDL: 68 mg/dL (ref >50)
LDL Cholesterol: 71 mg/dL (ref <100)
TRIGLYCERIDES: 57 mg/dL (ref <150)
VLDL: 11 mg/dL (ref <30)

## 2017-02-16 LAB — POCT GLYCOSYLATED HEMOGLOBIN (HGB A1C): HEMOGLOBIN A1C: 5.6

## 2017-02-16 MED ORDER — METOPROLOL TARTRATE 25 MG PO TABS: 25.0000 mg | ORAL_TABLET | Freq: Two times a day (BID) | ORAL | 1 refills | Status: DC

## 2017-02-16 MED ORDER — AMLODIPINE BESYLATE 5 MG PO TABS: 5.0000 mg | ORAL_TABLET | Freq: Every day | ORAL | 1 refills | Status: DC

## 2017-02-16 MED ORDER — ATORVASTATIN CALCIUM 40 MG PO TABS: 40.0000 mg | ORAL_TABLET | Freq: Every day | ORAL | 0 refills | Status: DC

## 2017-02-16 NOTE — Progress Notes (Signed)
Name: Kaitlyn Gonzalez   MRN: [id number]    DOB: Jul 01, 1936   Date:02/16/2017       Progress Note  Subjective  Chief Complaint  Chief Complaint  Patient presents with  . Annual Exam    Hyperlipidemia  This is a chronic problem. The problem is controlled. Pertinent negatives include no chest pain or shortness of breath. Current antihyperlipidemic treatment includes statins.  Hypertension  This is a chronic problem. The problem is unchanged. Pertinent negatives include no blurred vision, chest pain, headaches, palpitations or shortness of breath. Past treatments include calcium channel blockers and beta blockers.      Past Medical History:  Diagnosis Date  . Dyslipidemia   . History of stroke with residual effects    Stroke in April 2016.  Marland Kitchen Hypertension   . Legally blind in right eye, as defined in [place]   . Peripheral vascular disease [facility])     Past Surgical History:  Procedure Laterality Date  . PERIPHERAL ARTERIAL STENT GRAFT Left 2010    Family History  Problem Relation Age of Onset  . Diabetes Sister   . Cancer Sister     breast  . Alzheimer's disease Mother   . Dementia Mother   . AAA (abdominal aortic aneurysm) Father     Social History   Social History  . Marital status: Divorced    Spouse name: N/A  . Number of children: N/A  . Years of education: N/A   Occupational History  . Not on file.   Social History Main Topics  . Smoking status: Current Every Day Smoker    Packs/day: 0.25    Types: Cigarettes  . Smokeless tobacco: Never Used  . Alcohol use No  . Drug use: No  . Sexual activity: No   Other Topics Concern  . Not on file   Social History Narrative  . No narrative on file     Current Outpatient Prescriptions:  .  amLODipine (NORVASC) 5 MG tablet, Take 1 tablet (5 mg total) by mouth daily., Disp: 90 tablet, Rfl: 1 .  aspirin 81 MG tablet, Take 81 mg by mouth daily., Disp: , Rfl:  .  atorvastatin (LIPITOR) 40 MG tablet, Take 1  tablet (40 mg total) by mouth daily., Disp: 30 tablet, Rfl: 3 .  metoprolol tartrate (LOPRESSOR) 25 MG tablet, Take 1 tablet (25 mg total) by mouth 2 (two) times daily., Disp: 180 tablet, Rfl: 1  No Known Allergies   Review of Systems  Eyes: Negative for blurred vision.  Respiratory: Negative for shortness of breath.   Cardiovascular: Negative for chest pain and palpitations.  Neurological: Negative for headaches.      Objective  Vitals:   02/16/17 0905  BP: 120/70  Pulse: 81  Resp: 16  Temp: 98.2 F (36.8 C)  TempSrc: Oral  SpO2: 93%  Weight: 89 lb 8 oz (40.6 kg)  Height: 5\' 6"  (1.676 m)    Physical Exam  Constitutional: She is oriented to person, place, and time and well-developed, well-nourished, and in no distress.  HENT:  Head: Normocephalic and atraumatic.  Cardiovascular: Normal rate, regular rhythm, S1 normal and S2 normal.   Murmur heard.  Systolic murmur is present with a grade of 2/6  Pulmonary/Chest: Effort normal and breath sounds normal. She has no wheezes.  Abdominal: Soft. Bowel sounds are normal. There is no tenderness.  Musculoskeletal:       Right ankle: She exhibits no swelling.       Left ankle:  She exhibits no swelling.  Neurological: She is alert and oriented to person, place, and time.  Psychiatric: Mood, memory, affect and judgment normal.  Nursing note and vitals reviewed.     Assessment & Plan  1. Essential hypertension  - metoprolol tartrate (LOPRESSOR) 25 MG tablet; Take 1 tablet (25 mg total) by mouth 2 (two) times daily.  Dispense: 180 tablet; Refill: 1 - amLODipine (NORVASC) 5 MG tablet; Take 1 tablet (5 mg total) by mouth daily.  Dispense: 90 tablet; Refill: 1  2. Dyslipidemia  - atorvastatin (LIPITOR) 40 MG tablet; Take 1 tablet (40 mg total) by mouth daily at 6 PM.  Dispense: 90 tablet; Refill: 0 - Lipid panel  3. Hyperglycemia A1c is 5.6%, considered normal - POCT glycosylated hemoglobin (Hb A1C)  Maripat Borba Asad A.  [facility] 02/16/2017 9:15 AM

## 2017-04-20 ENCOUNTER — Other Ambulatory Visit: Payer: Self-pay | Admitting: Family Medicine

## 2017-04-20 ENCOUNTER — Encounter: Payer: Self-pay | Admitting: Family Medicine

## 2017-04-20 ENCOUNTER — Ambulatory Visit (INDEPENDENT_AMBULATORY_CARE_PROVIDER_SITE_OTHER): Payer: Medicare Other | Admitting: Family Medicine

## 2017-04-20 ENCOUNTER — Ambulatory Visit (INDEPENDENT_AMBULATORY_CARE_PROVIDER_SITE_OTHER): Payer: Medicare Other

## 2017-04-20 VITALS — BP 134/64 | HR 88 | Temp 98.2°F | Ht 66.0 in | Wt 85.5 lb

## 2017-04-20 DIAGNOSIS — Z0000 Encounter for general adult medical examination without abnormal findings: Secondary | ICD-10-CM | POA: Diagnosis not present

## 2017-04-20 DIAGNOSIS — E785 Hyperlipidemia, unspecified: Secondary | ICD-10-CM

## 2017-04-20 NOTE — Progress Notes (Signed)
Subjective:   Kaitlyn Gonzalez is a 81 y.o. female who presents for Medicare Annual (Subsequent) preventive examination.  Review of Systems:  N/A Cardiac Risk Factors include: advanced age (>83men, >5 women);hypertension;smoking/ tobacco exposure;dyslipidemia     Objective:     Vitals: BP 134/64 (BP Location: Right Arm)   Pulse 88   Temp 98.2 F (36.8 C) (Oral)   Ht 5\' 6"  (1.676 m)   Wt 85 lb 8 oz (38.8 kg)   BMI 13.80 kg/m   Body mass index is 13.8 kg/m.   Tobacco History  Smoking Status  . Current Every Day Smoker  . Packs/day: 0.25  . Types: Cigarettes  Smokeless Tobacco  . Never Used     Ready to quit: No Counseling given: Not Answered   Past Medical History:  Diagnosis Date  . Dyslipidemia   . History of stroke with residual effects    Stroke in April 2016.  Marland Kitchen Hypertension   . Legally blind in right eye, as defined in [place]   . Peripheral vascular disease [facility])    Past Surgical History:  Procedure Laterality Date  . PERIPHERAL ARTERIAL STENT GRAFT Left 2010   Family History  Problem Relation Age of Onset  . Diabetes Sister   . Cancer Sister        breast  . Alzheimer's disease Mother   . Dementia Mother   . AAA (abdominal aortic aneurysm) Father    History  Sexual Activity  . Sexual activity: No    Outpatient Encounter Prescriptions as of 04/20/2017  Medication Sig  . amLODipine (NORVASC) 5 MG tablet Take 1 tablet (5 mg total) by mouth daily.  Marland Kitchen aspirin 81 MG tablet Take 81 mg by mouth daily.  . Aspirin-Caffeine (BC FAST PAIN RELIEF PO) Take by mouth as needed.  Marland Kitchen atorvastatin (LIPITOR) 40 MG tablet Take 1 tablet (40 mg total) by mouth daily at 6 PM.  . metoprolol tartrate (LOPRESSOR) 25 MG tablet Take 1 tablet (25 mg total) by mouth 2 (two) times daily.   No facility-administered encounter medications on file as of 04/20/2017.     Activities of Daily Living In your present state of health, do you have any difficulty performing the  following activities: 04/20/2017  Hearing? N  Vision? N  Difficulty concentrating or making decisions? N  Walking or climbing stairs? Y  Dressing or bathing? N  Doing errands, shopping? Y  Preparing Food and eating ? N  Using the Toilet? N  In the past six months, have you accidently leaked urine? N  Do you have problems with loss of bowel control? N  Managing your Medications? N  Managing your Finances? N  Housekeeping or managing your Housekeeping? N  Some recent data might be hidden    Patient Care Team: Roselee Nova, MD as PCP - General (Family Medicine) Lorelee Cover., MD as Consulting Physician (Ophthalmology) Reche Dixon, PA-C as Consulting Physician (Orthopedic Surgery)    Assessment:     Exercise Activities and Dietary recommendations Current Exercise Habits: The patient does not participate in regular exercise at present, Exercise limited by: orthopedic condition(s) (leg pain)  Goals    . Increase water intake          Continue drinking 6 glasses of water a day.      Fall Risk Fall Risk  04/20/2017 06/25/2016 04/09/2016 02/15/2016  Falls in the past year? Yes Yes No No  Number falls in past yr: 2 or more 1 - -  Injury with Fall? No No - -  Risk Factor Category  High Fall Risk - - -  Risk for fall due to : History of fall(s);Impaired balance/gait - - -  Follow up Falls prevention discussed Falls evaluation completed - -   Depression Screen PHQ 2/9 Scores 04/20/2017 04/09/2016 02/15/2016  PHQ - 2 Score 0 0 0     Cognitive Function     6CIT Screen 04/20/2017  What Year? 4 points  What month? 0 points  What time? 3 points  Count back from 20 0 points  Months in reverse 2 points  Repeat phrase 10 points  Total Score 19    Immunization History  Administered Date(s) Administered  . Influenza,inj,Quad PF,36+ Mos 02/15/2016   Screening Tests Health Maintenance  Topic Date Due  . DEXA SCAN  09/17/2017 (Originally 07/21/2003)  . PNA vac Low Risk Adult (1 of  2 - PCV13) 04/17/2018 (Originally 07/21/2003)  . TETANUS/TDAP  11/17/2026 (Originally 07/20/1955)  . INFLUENZA VACCINE  06/17/2017      Plan:  I have personally reviewed and addressed the Medicare Annual Wellness questionnaire and have noted the following in the patient's chart:  A. Medical and social history B. Use of alcohol, tobacco or illicit drugs  C. Current medications and supplements D. Functional ability and status E.  Nutritional status F.  Physical activity G. Advance directives H. List of other physicians I.  Hospitalizations, surgeries, and ER visits in previous 12 months J.  [place] such as hearing and vision if needed, cognitive and depression L. Referrals and appointments - none  In addition, I have reviewed and discussed with patient certain preventive protocols, quality metrics, and best practice recommendations. A written personalized care plan for preventive services as well as general preventive health recommendations were provided to patient.  See attached scanned questionnaire for additional information.   Signed,  Fabio Neighbors, LPN Nurse Health Advisor   MD Recommendations:  Pt declined pneumonia and tetanus vaccine today. DEXA scan not covered.

## 2017-04-20 NOTE — Patient Instructions (Signed)
Kaitlyn Gonzalez , Thank you for taking time to come for your Medicare Wellness Visit. I appreciate your ongoing commitment to your health goals. Please review the following plan we discussed and let me know if I can assist you in the future.   Screening recommendations/referrals: Colonoscopy: N/A Mammogram: N/A Bone Density: declined Recommended yearly ophthalmology/optometry visit for glaucoma screening and checkup Recommended yearly dental visit for hygiene and checkup  Vaccinations: Influenza vaccine: up to date, due 07/2017 Pneumococcal vaccine: declined today Tdap vaccine: declined today Shingles vaccine: declined today   Advanced directives: Advance directive discussed with you today. I have provided a copy for you to complete at home and have notarized. Once this is complete please bring a copy in to our office so we can scan it into your chart.  Conditions/risks identified: Continue drinking 6 glasses of water a day.  Next appointment: 04/20/17 @ 9:40 AM   Preventive Care 65 Years and Older, Female Preventive care refers to lifestyle choices and visits with your health care provider that can promote health and wellness. What does preventive care include?  A yearly physical exam. This is also called an annual well check.  Dental exams once or twice a year.  Routine eye exams. Ask your health care provider how often you should have your eyes checked.  Personal lifestyle choices, including:  Daily care of your teeth and gums.  Regular physical activity.  Eating a healthy diet.  Avoiding tobacco and drug use.  Limiting alcohol use.  Practicing safe sex.  Taking low-dose aspirin every day.  Taking vitamin and mineral supplements as recommended by your health care provider. What happens during an annual well check? The services and screenings done by your health care provider during your annual well check will depend on your age, overall health, lifestyle risk  factors, and family history of disease. Counseling  Your health care provider may ask you questions about your:  Alcohol use.  Tobacco use.  Drug use.  Emotional well-being.  Home and relationship well-being.  Sexual activity.  Eating habits.  History of falls.  Memory and ability to understand (cognition).  Work and work Statistician.  Reproductive health. Screening  You may have the following tests or measurements:  Height, weight, and BMI.  Blood pressure.  Lipid and cholesterol levels. These may be checked every 5 years, or more frequently if you are over 72 years old.  Skin check.  Lung cancer screening. You may have this screening every year starting at age 79 if you have a 30-pack-year history of smoking and currently smoke or have quit within the past 15 years.  Fecal occult blood test (FOBT) of the stool. You may have this test every year starting at age 98.  Flexible sigmoidoscopy or colonoscopy. You may have a sigmoidoscopy every 5 years or a colonoscopy every 10 years starting at age 74.  Hepatitis C blood test.  Hepatitis B blood test.  Sexually transmitted disease (STD) testing.  Diabetes screening. This is done by checking your blood sugar (glucose) after you have not eaten for a while (fasting). You may have this done every 1-3 years.  Bone density scan. This is done to screen for osteoporosis. You may have this done starting at age 59.  Mammogram. This may be done every 1-2 years. Talk to your health care provider about how often you should have regular mammograms. Talk with your health care provider about your test results, treatment options, and if necessary, the need for more tests.  Vaccines  Your health care provider may recommend certain vaccines, such as:  Influenza vaccine. This is recommended every year.  Tetanus, diphtheria, and acellular pertussis (Tdap, Td) vaccine. You may need a Td booster every 10 years.  Zoster vaccine. You  may need this after age 57.  Pneumococcal 13-valent conjugate (PCV13) vaccine. One dose is recommended after age 95.  Pneumococcal polysaccharide (PPSV23) vaccine. One dose is recommended after age 60. Talk to your health care provider about which screenings and vaccines you need and how often you need them. This information is not intended to replace advice given to you by your health care provider. Make sure you discuss any questions you have with your health care provider. Document Released: 11/30/2015 Document Revised: 07/23/2016 Document Reviewed: 09/04/2015 Elsevier Interactive Patient Education  2017 [place] Prevention in the Home Falls can cause injuries. They can happen to people of all ages. There are many things you can do to make your home safe and to help prevent falls. What can I do on the outside of my home?  Regularly fix the edges of walkways and driveways and fix any cracks.  Remove anything that might make you trip as you walk through a door, such as a raised step or threshold.  Trim any bushes or trees on the path to your home.  Use bright outdoor lighting.  Clear any walking paths of anything that might make someone trip, such as rocks or tools.  Regularly check to see if handrails are loose or broken. Make sure that both sides of any steps have handrails.  Any raised decks and porches should have guardrails on the edges.  Have any leaves, snow, or ice cleared regularly.  Use sand or salt on walking paths during winter.  Clean up any spills in your garage right away. This includes oil or grease spills. What can I do in the bathroom?  Use night lights.  Install grab bars by the toilet and in the tub and shower. Do not use towel bars as grab bars.  Use non-skid mats or decals in the tub or shower.  If you need to sit down in the shower, use a plastic, non-slip stool.  Keep the floor dry. Clean up any water that spills on the floor as soon as  it happens.  Remove soap buildup in the tub or shower regularly.  Attach bath mats securely with double-sided non-slip rug tape.  Do not have throw rugs and other things on the floor that can make you trip. What can I do in the bedroom?  Use night lights.  Make sure that you have a light by your bed that is easy to reach.  Do not use any sheets or blankets that are too big for your bed. They should not hang down onto the floor.  Have a firm chair that has side arms. You can use this for support while you get dressed.  Do not have throw rugs and other things on the floor that can make you trip. What can I do in the kitchen?  Clean up any spills right away.  Avoid walking on wet floors.  Keep items that you use a lot in easy-to-reach places.  If you need to reach something above you, use a strong step stool that has a grab bar.  Keep electrical cords out of the way.  Do not use floor polish or wax that makes floors slippery. If you must use wax, use non-skid floor wax.  Do not have throw rugs and other things on the floor that can make you trip. What can I do with my stairs?  Do not leave any items on the stairs.  Make sure that there are handrails on both sides of the stairs and use them. Fix handrails that are broken or loose. Make sure that handrails are as long as the stairways.  Check any carpeting to make sure that it is firmly attached to the stairs. Fix any carpet that is loose or worn.  Avoid having throw rugs at the top or bottom of the stairs. If you do have throw rugs, attach them to the floor with carpet tape.  Make sure that you have a light switch at the top of the stairs and the bottom of the stairs. If you do not have them, ask someone to add them for you. What else can I do to help prevent falls?  Wear shoes that:  Do not have high heels.  Have rubber bottoms.  Are comfortable and fit you well.  Are closed at the toe. Do not wear sandals.  If  you use a stepladder:  Make sure that it is fully opened. Do not climb a closed stepladder.  Make sure that both sides of the stepladder are locked into place.  Ask someone to hold it for you, if possible.  Clearly mark and make sure that you can see:  Any grab bars or handrails.  First and last steps.  Where the edge of each step is.  Use tools that help you move around (mobility aids) if they are needed. These include:  Canes.  Walkers.  Scooters.  Crutches.  Turn on the lights when you go into a dark area. Replace any light bulbs as soon as they burn out.  Set up your furniture so you have a clear path. Avoid moving your furniture around.  If any of your floors are uneven, fix them.  If there are any pets around you, be aware of where they are.  Review your medicines with your doctor. Some medicines can make you feel dizzy. This can increase your chance of falling. Ask your doctor what other things that you can do to help prevent falls. This information is not intended to replace advice given to you by your health care provider. Make sure you discuss any questions you have with your health care provider. Document Released: 08/30/2009 Document Revised: 04/10/2016 Document Reviewed: 12/08/2014 Elsevier Interactive Patient Education  2017 [employer].

## 2017-04-20 NOTE — Progress Notes (Signed)
This encounter was created in error - please disregard.Name: Kaitlyn Gonzalez   MRN: [id number]    DOB: 1936-06-02   Date:04/20/2017       Progress Note  Subjective  Chief Complaint  Chief Complaint  Patient presents with  . Annual Exam    CPE    HPI    Past Medical History:  Diagnosis Date  . Dyslipidemia   . History of stroke with residual effects    Stroke in April 2016.  Marland Kitchen Hypertension   . Legally blind in right eye, as defined in [place]   . Peripheral vascular disease [facility])     Past Surgical History:  Procedure Laterality Date  . PERIPHERAL ARTERIAL STENT GRAFT Left 2010    Family History  Problem Relation Age of Onset  . Diabetes Sister   . Cancer Sister        breast  . Alzheimer's disease Mother   . Dementia Mother   . AAA (abdominal aortic aneurysm) Father     Social History   Social History  . Marital status: Divorced    Spouse name: N/A  . Number of children: N/A  . Years of education: N/A   Occupational History  . Not on file.   Social History Main Topics  . Smoking status: Current Every Day Smoker    Packs/day: 0.25    Types: Cigarettes  . Smokeless tobacco: Never Used  . Alcohol use No  . Drug use: No  . Sexual activity: No   Other Topics Concern  . Not on file   Social History Narrative  . No narrative on file     Current Outpatient Prescriptions:  .  amLODipine (NORVASC) 5 MG tablet, Take 1 tablet (5 mg total) by mouth daily., Disp: 90 tablet, Rfl: 1 .  aspirin 81 MG tablet, Take 81 mg by mouth daily., Disp: , Rfl:  .  Aspirin-Caffeine (BC FAST PAIN RELIEF PO), Take by mouth as needed., Disp: , Rfl:  .  atorvastatin (LIPITOR) 40 MG tablet, Take 1 tablet (40 mg total) by mouth daily at 6 PM., Disp: 90 tablet, Rfl: 0 .  metoprolol tartrate (LOPRESSOR) 25 MG tablet, Take 1 tablet (25 mg total) by mouth 2 (two) times daily., Disp: 180 tablet, Rfl: 1  No Known Allergies   ROS   Objective  Vitals:   04/20/17 0920  BP:  134/64  Pulse: 88  Resp: 16  Temp: 98.2 F (36.8 C)  TempSrc: Oral  Weight: 85 lb 8 oz (38.8 kg)  Height: 5\' 6"  (1.676 m)    Physical Exam     Recent Results (from the past 2160 hour(s))  Lipid panel     Status: None   Collection Time: 02/16/17  9:33 AM  Result Value Ref Range   Cholesterol 150 <200 mg/dL   Triglycerides 57 <150 mg/dL   HDL 68 >50 mg/dL   Total CHOL/HDL Ratio 2.2 <5.0 Ratio   VLDL 11 <30 mg/dL   LDL Cholesterol 71 <100 mg/dL  POCT glycosylated hemoglobin (Hb A1C)     Status: None   Collection Time: 02/16/17  9:33 AM  Result Value Ref Range   Hemoglobin A1C 5.6      Assessment & Plan  There are no diagnoses linked to this encounter.  Seriyah Collison [facility] 04/20/2017 9:43 AM

## 2017-04-27 ENCOUNTER — Encounter: Payer: Self-pay | Admitting: Family Medicine

## 2017-05-06 DIAGNOSIS — M7061 Trochanteric bursitis, right hip: Secondary | ICD-10-CM | POA: Diagnosis not present

## 2017-05-06 DIAGNOSIS — M7062 Trochanteric bursitis, left hip: Secondary | ICD-10-CM | POA: Diagnosis not present

## 2017-06-22 ENCOUNTER — Encounter: Payer: Self-pay | Admitting: Emergency Medicine

## 2017-06-22 ENCOUNTER — Emergency Department
Admission: EM | Admit: 2017-06-22 | Discharge: 2017-06-22 | Disposition: A | Discharge status: Home / Self Care | Payer: Medicare Other | Attending: Emergency Medicine | Admitting: Emergency Medicine

## 2017-06-22 ENCOUNTER — Emergency Department: Payer: Medicare Other

## 2017-06-22 DIAGNOSIS — Y999 Unspecified external cause status: Secondary | ICD-10-CM | POA: Insufficient documentation

## 2017-06-22 DIAGNOSIS — S322XXA Fracture of coccyx, initial encounter for closed fracture: Secondary | ICD-10-CM | POA: Diagnosis not present

## 2017-06-22 DIAGNOSIS — Y929 Unspecified place or not applicable: Secondary | ICD-10-CM | POA: Diagnosis not present

## 2017-06-22 DIAGNOSIS — I10 Essential (primary) hypertension: Secondary | ICD-10-CM | POA: Insufficient documentation

## 2017-06-22 DIAGNOSIS — Y939 Activity, unspecified: Secondary | ICD-10-CM | POA: Insufficient documentation

## 2017-06-22 DIAGNOSIS — Z8673 Personal history of transient ischemic attack (TIA), and cerebral infarction without residual deficits: Secondary | ICD-10-CM | POA: Diagnosis not present

## 2017-06-22 DIAGNOSIS — D649 Anemia, unspecified: Secondary | ICD-10-CM | POA: Diagnosis not present

## 2017-06-22 DIAGNOSIS — S3992XA Unspecified injury of lower back, initial encounter: Secondary | ICD-10-CM | POA: Diagnosis not present

## 2017-06-22 DIAGNOSIS — F17210 Nicotine dependence, cigarettes, uncomplicated: Secondary | ICD-10-CM | POA: Diagnosis not present

## 2017-06-22 DIAGNOSIS — R000 Tachycardia, unspecified: Secondary | ICD-10-CM | POA: Diagnosis not present

## 2017-06-22 DIAGNOSIS — W19XXXA Unspecified fall, initial encounter: Secondary | ICD-10-CM | POA: Insufficient documentation

## 2017-06-22 DIAGNOSIS — M545 Low back pain: Secondary | ICD-10-CM | POA: Diagnosis not present

## 2017-06-22 HISTORY — DX: Cerebral infarction, unspecified: I63.9

## 2017-06-22 LAB — CBC WITH DIFFERENTIAL/PLATELET
BASOS PCT: 0 %
Basophils Absolute: 0 10*3/uL (ref 0–0.1)
EOS ABS: 0 10*3/uL (ref 0–0.7)
EOS PCT: 0 %
HCT: 29.7 % — ABNORMAL LOW (ref 35.0–47.0)
Hemoglobin: 9.7 g/dL — ABNORMAL LOW (ref 12.0–16.0)
LYMPHS ABS: 0.7 10*3/uL — AB (ref 1.0–3.6)
Lymphocytes Relative: 7 %
MCH: 25.8 pg — AB (ref 26.0–34.0)
MCHC: 32.5 g/dL (ref 32.0–36.0)
MCV: 79.5 fL — ABNORMAL LOW (ref 80.0–100.0)
MONOS PCT: 8 %
Monocytes Absolute: 0.9 10*3/uL (ref 0.2–0.9)
NEUTROS PCT: 85 %
Neutro Abs: 9.2 10*3/uL — ABNORMAL HIGH (ref 1.4–6.5)
PLATELETS: 278 10*3/uL (ref 150–440)
RBC: 3.74 MIL/uL — ABNORMAL LOW (ref 3.80–5.20)
RDW: 20.9 % — AB (ref 11.5–14.5)
WBC: 10.8 10*3/uL (ref 3.6–11.0)

## 2017-06-22 LAB — COMPREHENSIVE METABOLIC PANEL
ALBUMIN: 3.6 g/dL (ref 3.5–5.0)
ALT: 11 U/L — ABNORMAL LOW (ref 14–54)
ANION GAP: 9 (ref 5–15)
AST: 13 U/L — ABNORMAL LOW (ref 15–41)
Alkaline Phosphatase: 58 U/L (ref 38–126)
BUN: 12 mg/dL (ref 6–20)
CO2: 24 mmol/L (ref 22–32)
Calcium: 9 mg/dL (ref 8.9–10.3)
Chloride: 105 mmol/L (ref 101–111)
Creatinine, Ser: 0.68 mg/dL (ref 0.44–1.00)
GFR calc Af Amer: >60 mL/min (ref >60)
GFR calc non Af Amer: >60 mL/min (ref >60)
GLUCOSE: 125 mg/dL — AB (ref 65–99)
Potassium: 3.7 mmol/L (ref 3.5–5.1)
SODIUM: 138 mmol/L (ref 135–145)
TOTAL PROTEIN: 7 g/dL (ref 6.5–8.1)
Total Bilirubin: 0.6 mg/dL (ref 0.3–1.2)

## 2017-06-22 LAB — TROPONIN I: Troponin I: <0.03 ng/mL (ref <0.03)

## 2017-06-22 MED ORDER — OXYCODONE-ACETAMINOPHEN 5-325 MG PO TABS
1.0000 | ORAL_TABLET | Freq: Once | ORAL | Status: AC
Start: 2017-06-22 — End: 2017-06-22
  Administered 2017-06-22: 1 via ORAL
  Filled 2017-06-22: qty 1

## 2017-06-22 MED ORDER — MORPHINE SULFATE (PF) 2 MG/ML IV SOLN
2.0000 mg | Freq: Once | INTRAVENOUS | Status: AC
  Administered 2017-06-22: 2 mg via INTRAVENOUS
  Filled 2017-06-22: qty 1

## 2017-06-22 MED ORDER — FERROUS SULFATE DRIED ER 160 (50 FE) MG PO TBCR: 160.0000 mg | EXTENDED_RELEASE_TABLET | Freq: Every day | ORAL | 6 refills | Status: DC

## 2017-06-22 MED ORDER — OXYCODONE-ACETAMINOPHEN 5-325 MG PO TABS: 1.0000 | ORAL_TABLET | Freq: Three times a day (TID) | ORAL | 0 refills | Status: DC | PRN

## 2017-06-22 MED ORDER — MICROFIBRILLAR COLL HEMOSTAT EX POWD
CUTANEOUS | Status: AC
  Filled 2017-06-22: qty 5

## 2017-06-22 NOTE — ED Triage Notes (Signed)
Mechanical fall 3 days ago landing on tailbone. States pain tailbone. States increasing since fall. Alert and oriented.

## 2017-06-22 NOTE — ED Provider Notes (Signed)
[facility] Emergency Department Provider Note       Time seen: ----------------------------------------- 8:45 AM on 06/22/2017 -----------------------------------------     I have reviewed the triage vital signs and the nursing notes.   HISTORY   Chief Complaint Fall    HPI Kaitlyn Gonzalez is a 81 y.o. female who presents to the ED for a mechanical fall that occurred 3 days ago. Patient reports landing on her tailbone. She is only complaining of tailbone pain, reports pain has increased since the fall. She typically walks with a cane. She denies fevers, chills, chest pain, shortness of breath, vomiting or diarrhea. She has had some cold symptoms.   Past Medical History:  Diagnosis Date  . Dyslipidemia   . History of stroke with residual effects    Stroke in April 2016.  Marland Kitchen Hypertension   . Legally blind in right eye, as defined in [place]   . Peripheral vascular disease [facility])     Patient Active Problem List   Diagnosis Date Noted  . Hyperglycemia 02/16/2017  . Tachycardia with 100 - 120 beats per minute 04/09/2016  . Hypertension 02/15/2016  . Peripheral vascular disease ([place]) 02/15/2016  . Dyslipidemia 02/15/2016  . History of stroke with residual effects 02/15/2016  . Heart murmur on physical examination 02/15/2016    Past Surgical History:  Procedure Laterality Date  . PERIPHERAL ARTERIAL STENT GRAFT Left 2010    Allergies Patient has no known allergies.  Social History Social History  Substance Use Topics  . Smoking status: Current Every Day Smoker    Packs/day: 0.50    Types: Cigarettes  . Smokeless tobacco: Never Used  . Alcohol use No    Review of Systems Constitutional: Negative for fever. Eyes: Negative for vision changes ENT:  Positive for congestion Cardiovascular: Negative for chest pain. Respiratory: Negative for shortness of breath. Gastrointestinal: Negative for abdominal pain, vomiting and  diarrhea. Genitourinary: Negative for dysuria. Musculoskeletal: Positive for tailbone pain Skin: Negative for rash. Neurological: Negative for headaches, focal weakness or numbness.  All systems negative/normal/unremarkable except as stated in the HPI  ____________________________________________   PHYSICAL EXAM:  VITAL SIGNS: ED Triage Vitals  Enc Vitals Group     BP 06/22/17 0838 (!) 146/101     Pulse Rate 06/22/17 0838 (!) 122     Resp 06/22/17 0838 20     Temp 06/22/17 0838 98.6 F (37 C)     Temp Source 06/22/17 0838 Oral     SpO2 06/22/17 0838 97 %     Weight 06/22/17 0842 95 lb (43.1 kg)     Height 06/22/17 0842 5\' 6"  (1.676 m)     Head Circumference --      Peak Flow --      Pain Score 06/22/17 0837 10     Pain Loc --      Pain Edu? --      Excl. in [place]? --     Constitutional: Alert and oriented. Well appearing and in no distress. Eyes: Chronic scarring is noted on the right cornea, Normal extraocular movements. ENT   Head: Normocephalic and atraumatic.   Nose: No congestion/rhinnorhea.   Mouth/Throat: Mucous membranes are moist.   Neck: No stridor. Cardiovascular: Rapid rate, regular rhythm. No murmurs, rubs, or gallops. Respiratory: Normal respiratory effort without tachypnea nor retractions. Breath sounds are clear and equal bilaterally. No wheezes/rales/rhonchi. Gastrointestinal: Soft and nontender. Normal bowel sounds Musculoskeletal: Nontender with normal range of motion in extremities. No lower extremity tenderness  nor edema. Tenderness is located over the sacrum and coccyx Neurologic:  Normal speech and language. No gross focal neurologic deficits are appreciated.  Skin:  Skin is warm, dry and intact. No rash noted. Psychiatric: Mood and affect are normal. Speech and behavior are normal.  ____________________________________________  EKG: Interpreted by me. Sinus tachycardia with rate 118 bpm, normal PR interval, normal QRS, normal  QT.  ____________________________________________  ED COURSE:  Pertinent labs & imaging results that were available during my care of the patient were reviewed by me and considered in my medical decision making (see chart for details). Patient presents for fall, we will assess with labs and imaging as indicated.   Procedures ____________________________________________   LABS (pertinent positives/negatives)  Labs Reviewed  CBC WITH DIFFERENTIAL/PLATELET - Abnormal; Notable for the following:       Result Value   RBC 3.74 (*)    Hemoglobin 9.7 (*)    HCT 29.7 (*)    MCV 79.5 (*)    MCH 25.8 (*)    RDW 20.9 (*)    Neutro Abs 9.2 (*)    Lymphs Abs 0.7 (*)    All other components within normal limits  COMPREHENSIVE METABOLIC PANEL - Abnormal; Notable for the following:    Glucose, Bld 125 (*)    AST 13 (*)    ALT 11 (*)    All other components within normal limits  TROPONIN I    RADIOLOGY Images were viewed by me  Lumbar spine x-rays, sacrum and coccyx x-rays IMPRESSION: 1. Possible nondisplaced fracture through the sacrococcygeal junction. Correlate with point tenderness. 2. No lumbar spine fracture. 3. Grade 1 anterolisthesis at L4-L5 due to facet arthropathy. 4. Aortic Atherosclerosis (ICD10-I70.0). ____________________________________________  FINAL ASSESSMENT AND PLAN  Fall, Coccyx fracture, anemia  Plan: Patient's labs and imaging were dictated above. Patient had presented for mechanical fall. She is currently in no distress, patient states she is aware she is chronically anemic. I will presumptively place her on iron tablets give her pain medicine for her coccyx. She is stable for outpatient follow-up.   Earleen Newport, MD   Note: This note was generated in part or whole with voice recognition software. Voice recognition is usually quite accurate but there are transcription errors that can and very often do occur. I apologize for any typographical  errors that were not detected and corrected.     Earleen Newport, MD 06/22/17 1046

## 2017-06-22 NOTE — ED Notes (Signed)
The EKG was completed and signed by Dr. Williams. The EKG was also exported into the system. 

## 2017-07-01 ENCOUNTER — Ambulatory Visit: Payer: Medicare Other | Admitting: Neurology

## 2017-07-02 ENCOUNTER — Ambulatory Visit: Payer: Medicare Other | Admitting: Neurology

## 2017-07-24 ENCOUNTER — Ambulatory Visit (INDEPENDENT_AMBULATORY_CARE_PROVIDER_SITE_OTHER): Payer: Medicare Other | Admitting: Neurology

## 2017-07-24 ENCOUNTER — Encounter: Payer: Self-pay | Admitting: Neurology

## 2017-07-24 VITALS — BP 120/60 | HR 104 | Ht 66.0 in | Wt 79.0 lb

## 2017-07-24 DIAGNOSIS — I10 Essential (primary) hypertension: Secondary | ICD-10-CM

## 2017-07-24 DIAGNOSIS — I671 Cerebral aneurysm, nonruptured: Secondary | ICD-10-CM | POA: Diagnosis not present

## 2017-07-24 DIAGNOSIS — I6330 Cerebral infarction due to thrombosis of unspecified cerebral artery: Secondary | ICD-10-CM | POA: Diagnosis not present

## 2017-07-24 DIAGNOSIS — R531 Weakness: Secondary | ICD-10-CM | POA: Diagnosis not present

## 2017-07-24 DIAGNOSIS — F17200 Nicotine dependence, unspecified, uncomplicated: Secondary | ICD-10-CM

## 2017-07-24 DIAGNOSIS — I739 Peripheral vascular disease, unspecified: Secondary | ICD-10-CM

## 2017-07-24 DIAGNOSIS — R296 Repeated falls: Secondary | ICD-10-CM | POA: Diagnosis not present

## 2017-07-24 DIAGNOSIS — I779 Disorder of arteries and arterioles, unspecified: Secondary | ICD-10-CM

## 2017-07-24 NOTE — Patient Instructions (Addendum)
1.  Continue aspirin 81mg  daily and atorvastatin 40mg  daily 2.  We will order carotid doppler to evaluate stenosis and CTA of head to evaluate aneurysm 3.  We will order you physical therapy 4.  Follow up in one year

## 2017-07-24 NOTE — Progress Notes (Signed)
NEUROLOGY FOLLOW UP OFFICE NOTE  Kaitlyn Gonzalez [id number]  HISTORY OF PRESENT ILLNESS: Kaitlyn Gonzalez is a 81 year old right-handed woman with hypertension, hyperlipidemia, peripheral vascular disease and legally blind in right eye who presents for stroke.  She is accompanied by family (sister, son) who supplements history.  UPDATE: Last year, simvastatin was changed to atorvastatin.  LDL from 02/16/17 was 71.  Hgb A1c was 5.6.  Last year, carotid doppler was ordered but not performed.  Overall, she has been nonambulatory.  She will walk around her house sometimes.  She has not received PT.  No new stroke events.   HISTORY: She was admitted to [facility] from 03/12/15 to 03/13/15 for acute stroke.  At that time, she presented with left sided weakness.  CT of head was negative for acute changes.  MRI of brain demonstrated acute ischemic infarct in the right centrum semiovale.  MRA of head showed 2 mm partially thrombosed left cavernous carotid artery aneurysm but no significant intracranial stenosis.  MRA of neck showed 65% proximal right ICA stenosis and 50% proximal left ICA stenosis.  Hgb A1c was 5.3.  Lipid panel showed LDL of 99.  Echo showed EF 60-65% with no cardiac source of embolus.  She was not taking any medications at the time and was started on ASA 81mg  daily, simvastatin 20mg  daily, amlodipine and metoprolol.   She continues to have some left sided weakness.  She has some pain and weakness in legs due to peripheral vascular disease.  She has been evaluated by cardiology for systolic murmur.  She had a 24 hour Holter monitor in June, which was unremarkable.  Echo again demonstrated EF 60-65% with grade 1 diastolic dysfunction.  PAST MEDICAL HISTORY: Past Medical History:  Diagnosis Date  . Dyslipidemia   . History of stroke with residual effects    Stroke in April 2016.  Marland Kitchen Hypertension   . Legally blind in right eye, as defined in [place]   . Peripheral vascular disease  ([place])   . Stroke [facility])     MEDICATIONS: Current Outpatient Prescriptions on File Prior to Visit  Medication Sig Dispense Refill  . atorvastatin (LIPITOR) 40 MG tablet Take 1 tablet (40 mg total) by mouth daily at 6 PM. 90 tablet 0  . amLODipine (NORVASC) 5 MG tablet Take 1 tablet (5 mg total) by mouth daily. (Patient not taking: Reported on 06/22/2017) 90 tablet 1  . ferrous sulfate (EQL SLOW RELEASE IRON) 160 (50 Fe) MG TBCR SR tablet Take 1 tablet (160 mg total) by mouth daily. 30 each 6  . metoprolol tartrate (LOPRESSOR) 25 MG tablet Take 1 tablet (25 mg total) by mouth 2 (two) times daily. (Patient not taking: Reported on 06/22/2017) 180 tablet 1   No current facility-administered medications on file prior to visit.     ALLERGIES: No Known Allergies  FAMILY HISTORY: Family History  Problem Relation Age of Onset  . Diabetes Sister   . Cancer Sister        breast  . Alzheimer's disease Mother   . Dementia Mother   . AAA (abdominal aortic aneurysm) Father     SOCIAL HISTORY: Social History   Social History  . Marital status: Divorced    Spouse name: N/A  . Number of children: N/A  . Years of education: N/A   Occupational History  . Not on file.   Social History Main Topics  . Smoking status: Current Every Day Smoker    Packs/day: 0.50  Types: Cigarettes  . Smokeless tobacco: Never Used  . Alcohol use No  . Drug use: No  . Sexual activity: No   Other Topics Concern  . Not on file   Social History Narrative  . No narrative on file    REVIEW OF SYSTEMS: Constitutional: No fevers, chills, or sweats, no generalized fatigue, change in appetite Eyes: No visual changes, double vision, eye pain Ear, nose and throat: No hearing loss, ear pain, nasal congestion, sore throat Cardiovascular: No chest pain, palpitations Respiratory:  No shortness of breath at rest or with exertion, wheezes GastrointestinaI: No nausea, vomiting, diarrhea, abdominal pain, fecal  incontinence Genitourinary:  No dysuria, urinary retention or frequency Musculoskeletal:  No neck pain, back pain Integumentary: No rash, pruritus, skin lesions Neurological: as above Psychiatric: No depression, insomnia, anxiety Endocrine: No palpitations, fatigue, diaphoresis, mood swings, change in appetite, change in weight, increased thirst Hematologic/Lymphatic:  No purpura, petechiae. Allergic/Immunologic: no itchy/runny eyes, nasal congestion, recent allergic reactions, rashes  PHYSICAL EXAM: Vitals:   07/24/17 1116  BP: 120/60  Pulse: (!) 104  SpO2: 93%   General: No acute distress.  Cachectic. Head:  Normocephalic/atraumatic Eyes:  Scarring of right eye.  Fundi examined but not visualized Neck: supple, no paraspinal tenderness, full range of motion Heart:  Regular rate and rhythm Lungs:  Clear to auscultation bilaterally Back: No paraspinal tenderness Neurological Exam: Mental status: alert and oriented to person, place, and time, recent and remote memory intact, fund of knowledge intact, attention and concentration intact, speech fluent and not dysarthric, language intact. Cranial nerves:  Right eye scarring.  Left pupil round and reactive to light, blind in right eye.  Otherwise, CN II-XII intact.  Bulk & Tone: decreased bulk, normal tone no fasciculations.  Motor:  4-/5 left upper and bilateral lower extremities  Sensation: temperature sensation reduced in feet. Vibration sensation intact.  Deep Tendon Reflexes:  2+ throughout, toes downgoing.  Finger to nose testing:  Without dysmetria.   Gait:  Weak.  In wheelchair.  IMPRESSION:  CVA Bilateral carotid artery stenosis Cerebral aneurysm (left cavernous carotid artery) HTN Cerebral aneurysm Tobacco use disorder   PLAN: ASA 81mg  for secondary stroke prevention Continue atorvastatin 40mg  daily Continue blood pressure control Repeat carotid doppler  CTA of head to follow up on aneurysm Home PT Smoking  cessation Follow up in one year  25 minutes spent face to face with patient, over 50% spent discussing management.  Metta Clines, DO  CC:  Rochel Brome, MD

## 2017-07-30 ENCOUNTER — Ambulatory Visit: Payer: Medicaid Other | Admitting: Physical Therapy

## 2017-07-30 NOTE — Addendum Note (Signed)
Addended by: Clois Comber on: 07/30/2017 04:20 PM   Modules accepted: Orders

## 2017-08-10 ENCOUNTER — Ambulatory Visit: Payer: Medicaid Other | Admitting: Physical Therapy

## 2017-08-12 ENCOUNTER — Ambulatory Visit: Payer: Medicaid Other | Admitting: Physical Therapy

## 2017-08-18 ENCOUNTER — Ambulatory Visit: Payer: Medicaid Other | Admitting: Physical Therapy

## 2017-08-20 ENCOUNTER — Ambulatory Visit: Payer: Medicaid Other | Admitting: Physical Therapy

## 2017-08-25 ENCOUNTER — Ambulatory Visit: Payer: Medicaid Other | Admitting: Physical Therapy

## 2017-08-27 ENCOUNTER — Ambulatory Visit: Payer: Medicaid Other | Admitting: Physical Therapy

## 2017-09-01 ENCOUNTER — Ambulatory Visit: Payer: Medicaid Other | Admitting: Physical Therapy

## 2017-09-02 ENCOUNTER — Ambulatory Visit: Payer: Medicare Other | Admitting: Family Medicine

## 2017-09-03 ENCOUNTER — Ambulatory Visit: Payer: Medicaid Other | Admitting: Physical Therapy

## 2017-09-08 ENCOUNTER — Ambulatory Visit: Payer: Medicaid Other | Admitting: Physical Therapy

## 2017-09-09 ENCOUNTER — Encounter: Payer: Self-pay | Admitting: Family Medicine

## 2017-09-09 ENCOUNTER — Ambulatory Visit (INDEPENDENT_AMBULATORY_CARE_PROVIDER_SITE_OTHER): Payer: Medicare Other | Admitting: Family Medicine

## 2017-09-09 VITALS — BP 118/62 | HR 100 | Temp 98.1°F | Resp 16 | Ht 64.0 in | Wt 76.8 lb

## 2017-09-09 DIAGNOSIS — R636 Underweight: Secondary | ICD-10-CM | POA: Diagnosis not present

## 2017-09-09 DIAGNOSIS — Z8673 Personal history of transient ischemic attack (TIA), and cerebral infarction without residual deficits: Secondary | ICD-10-CM

## 2017-09-09 DIAGNOSIS — S3210XA Unspecified fracture of sacrum, initial encounter for closed fracture: Secondary | ICD-10-CM | POA: Diagnosis not present

## 2017-09-09 DIAGNOSIS — I6330 Cerebral infarction due to thrombosis of unspecified cerebral artery: Secondary | ICD-10-CM | POA: Diagnosis not present

## 2017-09-09 DIAGNOSIS — Z7409 Other reduced mobility: Secondary | ICD-10-CM | POA: Diagnosis not present

## 2017-09-09 DIAGNOSIS — Z23 Encounter for immunization: Secondary | ICD-10-CM

## 2017-09-09 DIAGNOSIS — M7062 Trochanteric bursitis, left hip: Secondary | ICD-10-CM | POA: Diagnosis not present

## 2017-09-09 DIAGNOSIS — E785 Hyperlipidemia, unspecified: Secondary | ICD-10-CM | POA: Diagnosis not present

## 2017-09-09 DIAGNOSIS — I10 Essential (primary) hypertension: Secondary | ICD-10-CM

## 2017-09-09 LAB — COMPLETE METABOLIC PANEL WITH GFR
AG Ratio: 1.2 (calc) (ref 1.0–2.5)
ALBUMIN MSPROF: 3.8 g/dL (ref 3.6–5.1)
ALT: 11 U/L (ref 6–29)
AST: 15 U/L (ref 10–35)
Alkaline phosphatase (APISO): 71 U/L (ref 33–130)
BILIRUBIN TOTAL: 0.2 mg/dL (ref 0.2–1.2)
BUN / CREAT RATIO: 17 (calc) (ref 6–22)
BUN: 8 mg/dL (ref 7–25)
CO2: 28 mmol/L (ref 20–32)
CREATININE: 0.46 mg/dL — AB (ref 0.60–0.93)
Calcium: 9.3 mg/dL (ref 8.6–10.4)
Chloride: 103 mmol/L (ref 98–110)
GFR, EST AFRICAN AMERICAN: 110 mL/min/{1.73_m2} (ref >=60)
GFR, Est Non African American: 95 mL/min/{1.73_m2} (ref >=60)
GLOBULIN: 3.2 g/dL (ref 1.9–3.7)
Glucose, Bld: 98 mg/dL (ref 65–139)
Potassium: 4.4 mmol/L (ref 3.5–5.3)
Sodium: 139 mmol/L (ref 135–146)
TOTAL PROTEIN: 7 g/dL (ref 6.1–8.1)

## 2017-09-09 MED ORDER — METOPROLOL TARTRATE 25 MG PO TABS: 25.0000 mg | ORAL_TABLET | Freq: Two times a day (BID) | ORAL | 1 refills | Status: DC

## 2017-09-09 MED ORDER — ATORVASTATIN CALCIUM 40 MG PO TABS: 40.0000 mg | ORAL_TABLET | Freq: Every day | ORAL | 0 refills | Status: DC

## 2017-09-09 MED ORDER — AMLODIPINE BESYLATE 5 MG PO TABS: 5.0000 mg | ORAL_TABLET | Freq: Every day | ORAL | 1 refills | Status: DC

## 2017-09-09 NOTE — Progress Notes (Signed)
Name: Kaitlyn Gonzalez   MRN: [id number]    DOB: March 04, 1936   Date:09/09/2017       Progress Note  Subjective  Chief Complaint  Chief Complaint  Patient presents with  . facility     elvauation to be consider for a nursing home  . forms    Fill out for nursing facility    HPI  Pt. Is brought in by her oldest son for consideration of placement in a facility. She has history of stroke, legally blind in right eye, hx of trochanteric bursitis of left hip and coccygeal fracture after a fall. She is able to walk only with another person's assistance and sometimes a cane, has gotten weaker to the point that it has gotten difficult to transfer positions such as from chair to her bed.  She has also gotten weaker, her son believes that she is not consuming the required number of calories for her health, has lost approximately 14 lbs in last 6 months. Patient reports she eats but 'not the right stuff'. She is able to take her medications without assistance.     Past Medical History:  Diagnosis Date  . Dyslipidemia   . History of stroke with residual effects    Stroke in April 2016.  Marland Kitchen Hypertension   . Legally blind in right eye, as defined in [place]   . Peripheral vascular disease ([place])   . Stroke [facility])     Past Surgical History:  Procedure Laterality Date  . PERIPHERAL ARTERIAL STENT GRAFT Left 2010    Family History  Problem Relation Age of Onset  . Diabetes Sister   . Cancer Sister        breast  . Alzheimer's disease Mother   . Dementia Mother   . AAA (abdominal aortic aneurysm) Father     Social History   Social History  . Marital status: Divorced    Spouse name: N/A  . Number of children: N/A  . Years of education: N/A   Occupational History  . Not on file.   Social History Main Topics  . Smoking status: Current Every Day Smoker    Packs/day: 0.50    Types: Cigarettes  . Smokeless tobacco: Never Used  . Alcohol use No  . Drug use: No  . Sexual activity: No    Other Topics Concern  . Not on file   Social History Narrative  . No narrative on file     Current Outpatient Prescriptions:  .  amLODipine (NORVASC) 5 MG tablet, Take 1 tablet (5 mg total) by mouth daily., Disp: 90 tablet, Rfl: 1 .  aspirin EC 81 MG tablet, Take 81 mg by mouth daily., Disp: , Rfl:  .  atorvastatin (LIPITOR) 40 MG tablet, Take 1 tablet (40 mg total) by mouth daily at 6 PM., Disp: 90 tablet, Rfl: 0 .  ferrous sulfate (EQL SLOW RELEASE IRON) 160 (50 Fe) MG TBCR SR tablet, Take 1 tablet (160 mg total) by mouth daily., Disp: 30 each, Rfl: 6 .  metoprolol tartrate (LOPRESSOR) 25 MG tablet, Take 1 tablet (25 mg total) by mouth 2 (two) times daily., Disp: 180 tablet, Rfl: 1  No Known Allergies   ROS    Objective  Vitals:   09/09/17 1440  BP: 118/62  Pulse: 100  Resp: 16  Temp: 98.1 F (36.7 C)  TempSrc: Oral  Weight: 76 lb 12.8 oz (34.8 kg)  Height: 5' 4"  (1.626 m)    Physical Exam  Constitutional: She  appears malnourished.  Frail elderly woman, appears underweight, sitting in NAD in a wheelchair.  HENT:  Head: Normocephalic and atraumatic.  Cardiovascular: Normal rate, regular rhythm and normal heart sounds.   No murmur heard. Pulmonary/Chest: Effort normal and breath sounds normal. She has no wheezes.  Neurological: She is alert.  Psychiatric: Mood, memory, affect and judgment normal.  Nursing note and vitals reviewed.      Recent Results (from the past 2160 hour(s))  CBC with Differential/Platelet     Status: Abnormal   Collection Time: 06/22/17  9:17 AM  Result Value Ref Range   WBC 10.8 3.6 - 11.0 K/uL   RBC 3.74 (L) 3.80 - 5.20 MIL/uL   Hemoglobin 9.7 (L) 12.0 - 16.0 g/dL   HCT 29.7 (L) 35.0 - 47.0 %   MCV 79.5 (L) 80.0 - 100.0 fL   MCH 25.8 (L) 26.0 - 34.0 pg   MCHC 32.5 32.0 - 36.0 g/dL   RDW 20.9 (H) 11.5 - 14.5 %   Platelets 278 150 - 440 K/uL   Neutrophils Relative % 85 %   Neutro Abs 9.2 (H) 1.4 - 6.5 K/uL   Lymphocytes  Relative 7 %   Lymphs Abs 0.7 (L) 1.0 - 3.6 K/uL   Monocytes Relative 8 %   Monocytes Absolute 0.9 0.2 - 0.9 K/uL   Eosinophils Relative 0 %   Eosinophils Absolute 0.0 0 - 0.7 K/uL   Basophils Relative 0 %   Basophils Absolute 0.0 0 - 0.1 K/uL  Comprehensive metabolic panel     Status: Abnormal   Collection Time: 06/22/17  9:17 AM  Result Value Ref Range   Sodium 138 135 - 145 mmol/L   Potassium 3.7 3.5 - 5.1 mmol/L   Chloride 105 101 - 111 mmol/L   CO2 24 22 - 32 mmol/L   Glucose, Bld 125 (H) 65 - 99 mg/dL   BUN 12 6 - 20 mg/dL   Creatinine, Ser 0.68 0.44 - 1.00 mg/dL   Calcium 9.0 8.9 - 10.3 mg/dL   Total Protein 7.0 6.5 - 8.1 g/dL   Albumin 3.6 3.5 - 5.0 g/dL   AST 13 (L) 15 - 41 U/L   ALT 11 (L) 14 - 54 U/L   Alkaline Phosphatase 58 38 - 126 U/L   Total Bilirubin 0.6 0.3 - 1.2 mg/dL   GFR calc non Af Amer >60 >60 mL/min   GFR calc Af Amer >60 >60 mL/min    Comment: (NOTE) The eGFR has been calculated using the CKD EPI equation. This calculation has not been validated in all clinical situations. eGFR's persistently <60 mL/min signify possible Chronic Kidney Disease.    Anion gap 9 5 - 15  Troponin I     Status: None   Collection Time: 06/22/17  9:17 AM  Result Value Ref Range   Troponin I <0.03 <0.03 ng/mL     Assessment & Plan  1. Needs flu shot  - Flu vaccine HIGH DOSE PF (Fluzone High dose)  2. Trochanteric bursitis of left hip Referral to physical therapy, patient continues to see orthopedics - Ambulatory referral to Physical Therapy  3. History of stroke  - Ambulatory referral to Home Health  4. Closed fracture of sacrum, unspecified portion of sacrum, initial encounter [facility]) Reviewed ER notes, referral to physical therapy - Ambulatory referral to Physical Therapy  5. Poor mobility  - Ambulatory referral to Physical Therapy  6. Low body weight due to inadequate caloric intake  - Ambulatory referral to  Home Health - COMPLETE METABOLIC PANEL  WITH GFR  7. Essential hypertension  - amLODipine (NORVASC) 5 MG tablet; Take 1 tablet (5 mg total) by mouth daily.  Dispense: 90 tablet; Refill: 1 - metoprolol tartrate (LOPRESSOR) 25 MG tablet; Take 1 tablet (25 mg total) by mouth 2 (two) times daily.  Dispense: 180 tablet; Refill: 1  8. Dyslipidemia  - atorvastatin (LIPITOR) 40 MG tablet; Take 1 tablet (40 mg total) by mouth daily at 6 PM.  Dispense: 90 tablet; Refill: 0  Dechelle Attaway [facility] 09/09/2017 2:50 PM

## 2017-09-10 ENCOUNTER — Ambulatory Visit: Payer: Medicaid Other | Admitting: Physical Therapy

## 2017-09-14 NOTE — Progress Notes (Signed)
Patient has not called back in to the [facility] 

## 2017-09-15 ENCOUNTER — Ambulatory Visit: Payer: Medicaid Other | Admitting: Physical Therapy

## 2017-09-15 DIAGNOSIS — I69354 Hemiplegia and hemiparesis following cerebral infarction affecting left non-dominant side: Secondary | ICD-10-CM | POA: Diagnosis not present

## 2017-09-15 DIAGNOSIS — F0390 Unspecified dementia without behavioral disturbance: Secondary | ICD-10-CM | POA: Diagnosis not present

## 2017-09-15 DIAGNOSIS — H548 Legal blindness, as defined in USA: Secondary | ICD-10-CM | POA: Diagnosis not present

## 2017-09-15 DIAGNOSIS — Z7982 Long term (current) use of aspirin: Secondary | ICD-10-CM | POA: Diagnosis not present

## 2017-09-15 DIAGNOSIS — M7062 Trochanteric bursitis, left hip: Secondary | ICD-10-CM | POA: Diagnosis not present

## 2017-09-15 DIAGNOSIS — E785 Hyperlipidemia, unspecified: Secondary | ICD-10-CM | POA: Diagnosis not present

## 2017-09-15 DIAGNOSIS — I10 Essential (primary) hypertension: Secondary | ICD-10-CM | POA: Diagnosis not present

## 2017-09-15 DIAGNOSIS — Z9181 History of falling: Secondary | ICD-10-CM | POA: Diagnosis not present

## 2017-09-15 DIAGNOSIS — F17210 Nicotine dependence, cigarettes, uncomplicated: Secondary | ICD-10-CM | POA: Diagnosis not present

## 2017-09-15 DIAGNOSIS — I739 Peripheral vascular disease, unspecified: Secondary | ICD-10-CM | POA: Diagnosis not present

## 2017-09-15 DIAGNOSIS — S3210XD Unspecified fracture of sacrum, subsequent encounter for fracture with routine healing: Secondary | ICD-10-CM | POA: Diagnosis not present

## 2017-09-15 DIAGNOSIS — M6281 Muscle weakness (generalized): Secondary | ICD-10-CM | POA: Diagnosis not present

## 2017-09-17 ENCOUNTER — Telehealth: Payer: Self-pay

## 2017-09-17 ENCOUNTER — Ambulatory Visit: Payer: Medicaid Other | Admitting: Physical Therapy

## 2017-09-17 NOTE — Telephone Encounter (Signed)
Copied from [place] [id number]. Topic: Referral - Request >> Sep 17, 2017  8:43 AM Arletha Grippe wrote: Reason for CRM: [facility] home [facility] called, he is requesting Physical therapy orders for 2 times a week for 8 weeks.  Also requesting Social Work eval, Occupational therapy eval, and Nursing Eval due to living conditions and fall risk. cb number for Mr Nicki Reaper is  [phone]

## 2017-09-18 DIAGNOSIS — I739 Peripheral vascular disease, unspecified: Secondary | ICD-10-CM | POA: Diagnosis not present

## 2017-09-18 DIAGNOSIS — M7062 Trochanteric bursitis, left hip: Secondary | ICD-10-CM | POA: Diagnosis not present

## 2017-09-18 DIAGNOSIS — M6281 Muscle weakness (generalized): Secondary | ICD-10-CM | POA: Diagnosis not present

## 2017-09-18 DIAGNOSIS — I69354 Hemiplegia and hemiparesis following cerebral infarction affecting left non-dominant side: Secondary | ICD-10-CM | POA: Diagnosis not present

## 2017-09-18 DIAGNOSIS — I10 Essential (primary) hypertension: Secondary | ICD-10-CM | POA: Diagnosis not present

## 2017-09-18 DIAGNOSIS — S3210XD Unspecified fracture of sacrum, subsequent encounter for fracture with routine healing: Secondary | ICD-10-CM | POA: Diagnosis not present

## 2017-09-18 NOTE — Telephone Encounter (Signed)
Patient will benefit from wheelchair, she is weak and has a low endurance. She is limited to be out of the house, family is willing to take her outside. But patient will require wheelchair for safety. From Lana @ [facility].   In the order it needs to say: She is very skinny Has to be 15x18 and hemi.   Lana @ homehealth [phone]

## 2017-09-21 DIAGNOSIS — S3210XD Unspecified fracture of sacrum, subsequent encounter for fracture with routine healing: Secondary | ICD-10-CM | POA: Diagnosis not present

## 2017-09-21 DIAGNOSIS — I739 Peripheral vascular disease, unspecified: Secondary | ICD-10-CM | POA: Diagnosis not present

## 2017-09-21 DIAGNOSIS — I10 Essential (primary) hypertension: Secondary | ICD-10-CM | POA: Diagnosis not present

## 2017-09-21 DIAGNOSIS — M6281 Muscle weakness (generalized): Secondary | ICD-10-CM | POA: Diagnosis not present

## 2017-09-21 DIAGNOSIS — I69354 Hemiplegia and hemiparesis following cerebral infarction affecting left non-dominant side: Secondary | ICD-10-CM | POA: Diagnosis not present

## 2017-09-21 DIAGNOSIS — M7062 Trochanteric bursitis, left hip: Secondary | ICD-10-CM | POA: Diagnosis not present

## 2017-09-21 NOTE — Telephone Encounter (Signed)
Copied from [place] [id number]. Topic: General - Other >> Sep 21, 2017  1:26 PM Cecelia Byars, NT wrote: Reason for CRM: Occupational therapist  from [facility] needs orders  for occupational therapy 2 x a week for 4 weeks, call Reece Packer [phone]

## 2017-09-22 DIAGNOSIS — S3210XD Unspecified fracture of sacrum, subsequent encounter for fracture with routine healing: Secondary | ICD-10-CM | POA: Diagnosis not present

## 2017-09-22 DIAGNOSIS — M6281 Muscle weakness (generalized): Secondary | ICD-10-CM | POA: Diagnosis not present

## 2017-09-22 DIAGNOSIS — I69354 Hemiplegia and hemiparesis following cerebral infarction affecting left non-dominant side: Secondary | ICD-10-CM | POA: Diagnosis not present

## 2017-09-22 DIAGNOSIS — I739 Peripheral vascular disease, unspecified: Secondary | ICD-10-CM | POA: Diagnosis not present

## 2017-09-22 DIAGNOSIS — I10 Essential (primary) hypertension: Secondary | ICD-10-CM | POA: Diagnosis not present

## 2017-09-22 DIAGNOSIS — M7062 Trochanteric bursitis, left hip: Secondary | ICD-10-CM | POA: Diagnosis not present

## 2017-09-22 NOTE — Telephone Encounter (Signed)
Returned call to [place] and left a voice message.

## 2017-09-23 DIAGNOSIS — I69354 Hemiplegia and hemiparesis following cerebral infarction affecting left non-dominant side: Secondary | ICD-10-CM | POA: Diagnosis not present

## 2017-09-23 DIAGNOSIS — I10 Essential (primary) hypertension: Secondary | ICD-10-CM | POA: Diagnosis not present

## 2017-09-23 DIAGNOSIS — S3210XD Unspecified fracture of sacrum, subsequent encounter for fracture with routine healing: Secondary | ICD-10-CM | POA: Diagnosis not present

## 2017-09-23 DIAGNOSIS — I739 Peripheral vascular disease, unspecified: Secondary | ICD-10-CM | POA: Diagnosis not present

## 2017-09-23 DIAGNOSIS — M6281 Muscle weakness (generalized): Secondary | ICD-10-CM | POA: Diagnosis not present

## 2017-09-23 DIAGNOSIS — M7062 Trochanteric bursitis, left hip: Secondary | ICD-10-CM | POA: Diagnosis not present

## 2017-09-24 DIAGNOSIS — I739 Peripheral vascular disease, unspecified: Secondary | ICD-10-CM | POA: Diagnosis not present

## 2017-09-24 DIAGNOSIS — I69354 Hemiplegia and hemiparesis following cerebral infarction affecting left non-dominant side: Secondary | ICD-10-CM | POA: Diagnosis not present

## 2017-09-24 DIAGNOSIS — S3210XD Unspecified fracture of sacrum, subsequent encounter for fracture with routine healing: Secondary | ICD-10-CM | POA: Diagnosis not present

## 2017-09-24 DIAGNOSIS — M6281 Muscle weakness (generalized): Secondary | ICD-10-CM | POA: Diagnosis not present

## 2017-09-24 DIAGNOSIS — M7062 Trochanteric bursitis, left hip: Secondary | ICD-10-CM | POA: Diagnosis not present

## 2017-09-24 DIAGNOSIS — I10 Essential (primary) hypertension: Secondary | ICD-10-CM | POA: Diagnosis not present

## 2017-09-25 ENCOUNTER — Ambulatory Visit: Payer: Medicare Other | Admitting: Neurology

## 2017-09-28 ENCOUNTER — Telehealth: Payer: Self-pay

## 2017-09-28 NOTE — Telephone Encounter (Signed)
Copied from [place] [phone]. Topic: General - Other >> Sep 21, 2017  1:26 PM Cecelia Byars, NT wrote: Reason for CRM: Occupational therapist  from [facility] needs orders  for occupational therapy 2 x a week for 4 weeks, call Reece Packer [phone]

## 2017-09-29 ENCOUNTER — Telehealth: Payer: Self-pay | Admitting: Family Medicine

## 2017-09-29 NOTE — Telephone Encounter (Signed)
Copied from [place] [id number]. Topic: Quick Communication - See Telephone Encounter >> Sep 29, 2017  1:32 PM Cleaster Corin, [place] wrote: CRM for notification. See Telephone encounter for: 09/29/17. Lana from [facility] home health called and said that Pt. Kaitlyn Gonzalez wants to request to cancel ALL [facility] home health visits (therapy, nurse visits, etc.) any questions please give Elane Fritz a call at [phone]

## 2017-09-30 NOTE — Telephone Encounter (Signed)
Please call occupational therapist and I would be glad to provide any instructions.

## 2017-10-07 NOTE — Telephone Encounter (Signed)
Please contact Kaitlyn Gonzalez. to determine why is she declining all home health visits?

## 2017-10-12 NOTE — Telephone Encounter (Signed)
Pt son states that his mom states she do not want them to come to her house anymore and she do not need them. Pt sons states she still needs them nut pt refused.

## 2017-10-13 NOTE — Telephone Encounter (Signed)
I respect her decision however the services being provided by [facility] are necessary to improve her day-to-day functioning and they're helpful in improving pain in her low back and hip

## 2017-12-02 ENCOUNTER — Telehealth: Payer: Self-pay

## 2017-12-02 NOTE — Telephone Encounter (Signed)
Received a home health Certification and plan of care form form [facility] and its asking for Skilled nursing assessment for MEDs for 09/30/2018-current  management and nutritional counseling. In 09/2017 pt asked for all the servcies to be DC and called and spoke to the patient and she states she do not want the services. Called and spoke to the pt and her son and no one has been to the house since she was DC from [facility]. Called the number provided on the form and left a message on carla Voicemail asking for her to call me back regarding  this matter.

## 2017-12-16 ENCOUNTER — Encounter: Payer: Self-pay | Admitting: Family Medicine

## 2017-12-16 ENCOUNTER — Ambulatory Visit
Admission: RE | Admit: 2017-12-16 | Discharge: 2017-12-16 | Disposition: A | Discharge status: Home / Self Care | Payer: Medicare Other | Source: Ambulatory Visit | Attending: Family Medicine | Admitting: Family Medicine

## 2017-12-16 ENCOUNTER — Ambulatory Visit (INDEPENDENT_AMBULATORY_CARE_PROVIDER_SITE_OTHER): Payer: Medicare Other | Admitting: Family Medicine

## 2017-12-16 VITALS — BP 102/56 | HR 80 | Temp 98.1°F | Resp 16 | Wt 77.0 lb

## 2017-12-16 DIAGNOSIS — G8929 Other chronic pain: Secondary | ICD-10-CM

## 2017-12-16 DIAGNOSIS — M25552 Pain in left hip: Principal | ICD-10-CM

## 2017-12-16 DIAGNOSIS — S79912A Unspecified injury of left hip, initial encounter: Secondary | ICD-10-CM | POA: Diagnosis not present

## 2017-12-16 MED ORDER — MELOXICAM 7.5 MG PO TABS: 7.5000 mg | ORAL_TABLET | Freq: Every day | ORAL | 0 refills | Status: DC

## 2017-12-16 NOTE — Progress Notes (Signed)
Name: Kaitlyn Gonzalez   MRN: [id number]    DOB: 1936-10-19   Date:12/16/2017       Progress Note  Subjective  Chief Complaint  Chief Complaint  Patient presents with  . Leg Pain    left leg pain onset: couple months ago. Pt fail and broke her tailbone 4 months ago and now she is experiencing pain form her left side hip down to her left leg    Hip Pain   The injury mechanism was a fall (she had a fall 6 months ago ended up fracturing her coccyx, ). The pain is present in the left leg and left hip. The pain is at a severity of 9/10 (at its worst, it has been 9/10, right now, she is in no pain). The patient is experiencing no pain. The pain has been fluctuating since onset. Associated symptoms comments: Worse with weight bearing. . She reports no foreign bodies (she had stents put in her left lower leg for PVD.) present. The symptoms are aggravated by movement and palpation.    Past Medical History:  Diagnosis Date  . Dyslipidemia   . History of stroke with residual effects    Stroke in April 2016.  Marland Kitchen Hypertension   . Legally blind in right eye, as defined in [place]   . Peripheral vascular disease ([place])   . Stroke [facility])     Past Surgical History:  Procedure Laterality Date  . PERIPHERAL ARTERIAL STENT GRAFT Left 2010    Family History  Problem Relation Age of Onset  . Diabetes Sister   . Cancer Sister        breast  . Alzheimer's disease Mother   . Dementia Mother   . AAA (abdominal aortic aneurysm) Father     Social History   Socioeconomic History  . Marital status: Divorced    Spouse name: Not on file  . Number of children: Not on file  . Years of education: Not on file  . Highest education level: Not on file  Social Needs  . Financial resource strain: Not on file  . Food insecurity - worry: Not on file  . Food insecurity - inability: Not on file  . Transportation needs - medical: Not on file  . Transportation needs - non-medical: Not on file  Occupational  History  . Not on file  Tobacco Use  . Smoking status: Current Every Day Smoker    Packs/day: 0.50    Types: Cigarettes  . Smokeless tobacco: Never Used  Substance and Sexual Activity  . Alcohol use: No    Alcohol/week: 0.0 oz  . Drug use: No  . Sexual activity: No  Other Topics Concern  . Not on file  Social History Narrative  . Not on file     Current Outpatient Medications:  .  amLODipine (NORVASC) 5 MG tablet, Take 1 tablet (5 mg total) by mouth daily., Disp: 90 tablet, Rfl: 1 .  aspirin EC 81 MG tablet, Take 81 mg by mouth daily., Disp: , Rfl:  .  atorvastatin (LIPITOR) 40 MG tablet, Take 1 tablet (40 mg total) by mouth daily at 6 PM., Disp: 90 tablet, Rfl: 0 .  ferrous sulfate (EQL SLOW RELEASE IRON) 160 (50 Fe) MG TBCR SR tablet, Take 1 tablet (160 mg total) by mouth daily., Disp: 30 each, Rfl: 6 .  metoprolol tartrate (LOPRESSOR) 25 MG tablet, Take 1 tablet (25 mg total) by mouth 2 (two) times daily., Disp: 180 tablet, Rfl: 1  No  Known Allergies   ROS  See HPI for complete discussion of ROS  Objective  Vitals:   12/16/17 1132  BP: (!) 98/54  Resp: 16  Temp: 98.1 F (36.7 C)  TempSrc: Oral  Weight: 77 lb (34.9 kg)    Physical Exam  Constitutional: She is oriented to person, place, and time and well-developed, well-nourished, and in no distress.  Musculoskeletal:       Left hip: She exhibits bony tenderness. She exhibits no swelling.       Legs: Tenderness over the lateral left anterior superior Iliac spine. Normal distal pulses, foot appears normal warm to touch, normal capillary refill.   Neurological: She is alert and oriented to person, place, and time.  Psychiatric: Mood, memory, affect and judgment normal.  Nursing note and vitals reviewed.      Assessment & Plan  1. Chronic left hip pain Appears inflammatory, suspect osteoarthritis, obtain x-rays of hip and start on meloxicam for 1 month - DG HIP UNILAT WITH PELVIS 2-3 VIEWS LEFT;  Future - meloxicam (MOBIC) 7.5 MG tablet; Take 1 tablet (7.5 mg total) by mouth daily.  Dispense: 30 tablet; Refill: 0   Donnalee Cellucci [facility] [employer] 12/16/2017 11:45 AM

## 2018-01-08 ENCOUNTER — Encounter: Payer: Self-pay | Admitting: General Surgery

## 2018-01-20 ENCOUNTER — Ambulatory Visit (INDEPENDENT_AMBULATORY_CARE_PROVIDER_SITE_OTHER): Payer: Medicare Other | Admitting: Family Medicine

## 2018-01-20 ENCOUNTER — Encounter: Payer: Self-pay | Admitting: Family Medicine

## 2018-01-20 VITALS — BP 104/72 | HR 97 | Temp 97.9°F | Wt 84.4 lb

## 2018-01-20 DIAGNOSIS — G723 Periodic paralysis: Secondary | ICD-10-CM | POA: Insufficient documentation

## 2018-01-20 DIAGNOSIS — I10 Essential (primary) hypertension: Secondary | ICD-10-CM | POA: Diagnosis not present

## 2018-01-20 DIAGNOSIS — G8929 Other chronic pain: Secondary | ICD-10-CM | POA: Diagnosis not present

## 2018-01-20 DIAGNOSIS — M4310 Spondylolisthesis, site unspecified: Secondary | ICD-10-CM

## 2018-01-20 DIAGNOSIS — I739 Peripheral vascular disease, unspecified: Secondary | ICD-10-CM

## 2018-01-20 DIAGNOSIS — Z780 Asymptomatic menopausal state: Secondary | ICD-10-CM | POA: Diagnosis not present

## 2018-01-20 DIAGNOSIS — S3210XA Unspecified fracture of sacrum, initial encounter for closed fracture: Secondary | ICD-10-CM | POA: Diagnosis not present

## 2018-01-20 DIAGNOSIS — M79605 Pain in left leg: Secondary | ICD-10-CM

## 2018-01-20 DIAGNOSIS — H5440 Blindness, one eye, unspecified eye: Secondary | ICD-10-CM | POA: Insufficient documentation

## 2018-01-20 DIAGNOSIS — Z87891 Personal history of nicotine dependence: Secondary | ICD-10-CM

## 2018-01-20 DIAGNOSIS — Z720 Tobacco use: Secondary | ICD-10-CM | POA: Diagnosis not present

## 2018-01-20 DIAGNOSIS — F17200 Nicotine dependence, unspecified, uncomplicated: Secondary | ICD-10-CM | POA: Insufficient documentation

## 2018-01-20 DIAGNOSIS — M25552 Pain in left hip: Secondary | ICD-10-CM | POA: Diagnosis not present

## 2018-01-20 DIAGNOSIS — M810 Age-related osteoporosis without current pathological fracture: Secondary | ICD-10-CM | POA: Insufficient documentation

## 2018-01-20 DIAGNOSIS — M8000XD Age-related osteoporosis with current pathological fracture, unspecified site, subsequent encounter for fracture with routine healing: Secondary | ICD-10-CM | POA: Diagnosis not present

## 2018-01-20 MED ORDER — MELOXICAM 7.5 MG PO TABS: 7.5000 mg | ORAL_TABLET | Freq: Every day | ORAL | 1 refills | Status: DC | PRN

## 2018-01-20 MED ORDER — AMLODIPINE BESYLATE 2.5 MG PO TABS: 2.5000 mg | ORAL_TABLET | Freq: Every day | ORAL | 3 refills | Status: DC

## 2018-01-20 MED ORDER — WHEELCHAIR MISC: 0 refills | Status: DC

## 2018-01-20 NOTE — Assessment & Plan Note (Signed)
Refer to orthopaedist; reviewed xray report, did not show any arthritis

## 2018-01-20 NOTE — Assessment & Plan Note (Signed)
Encouraged cessation; I am here to help

## 2018-01-20 NOTE — Assessment & Plan Note (Signed)
Managed by vascular doctor 

## 2018-01-20 NOTE — Assessment & Plan Note (Signed)
Related to stents; seeing vascular doctor; hip problem so will wheelchair

## 2018-01-20 NOTE — Assessment & Plan Note (Signed)
Noted on xrays

## 2018-01-20 NOTE — Assessment & Plan Note (Signed)
Order wheelchair

## 2018-01-20 NOTE — Patient Instructions (Addendum)
Decrease the amlodipine from 5 mg to 2.5 mg daily Start vitamin D3 and take 1,000 iu daily Please do call to schedule your bone density study; the number to schedule one at either [facility] or [facility] is [phone] or [phone]    Fall Prevention in the [place] can cause injuries. They can happen to people of all ages. There are many things you can do to make your home safe and to help prevent falls. What can I do on the outside of my home?  Regularly fix the edges of walkways and driveways and fix any cracks.  Remove anything that might make you trip as you walk through a door, such as a raised step or threshold.  Trim any bushes or trees on the path to your home.  Use bright outdoor lighting.  Clear any walking paths of anything that might make someone trip, such as rocks or tools.  Regularly check to see if handrails are loose or broken. Make sure that both sides of any steps have handrails.  Any raised decks and porches should have guardrails on the edges.  Have any leaves, snow, or ice cleared regularly.  Use sand or salt on walking paths during winter.  Clean up any spills in your garage right away. This includes oil or grease spills. What can I do in the bathroom?  Use night lights.  Install grab bars by the toilet and in the tub and shower. Do not use towel bars as grab bars.  Use non-skid mats or decals in the tub or shower.  If you need to sit down in the shower, use a plastic, non-slip stool.  Keep the floor dry. Clean up any water that spills on the floor as soon as it happens.  Remove soap buildup in the tub or shower regularly.  Attach bath mats securely with double-sided non-slip rug tape.  Do not have throw rugs and other things on the floor that can make you trip. What can I do in the bedroom?  Use night lights.  Make sure that you have a light by your bed that is easy to reach.  Do not use any sheets or  blankets that are too big for your bed. They should not hang down onto the floor.  Have a firm chair that has side arms. You can use this for support while you get dressed.  Do not have throw rugs and other things on the floor that can make you trip. What can I do in the kitchen?  Clean up any spills right away.  Avoid walking on wet floors.  Keep items that you use a lot in easy-to-reach places.  If you need to reach something above you, use a strong step stool that has a grab bar.  Keep electrical cords out of the way.  Do not use floor polish or wax that makes floors slippery. If you must use wax, use non-skid floor wax.  Do not have throw rugs and other things on the floor that can make you trip. What can I do with my stairs?  Do not leave any items on the stairs.  Make sure that there are handrails on both sides of the stairs and use them. Fix handrails that are broken or loose. Make sure that handrails are as long as the stairways.  Check any carpeting to make sure that it is firmly attached to the stairs. Fix any carpet that is loose or worn.  Avoid having throw rugs at the top or bottom of the stairs. If you do have throw rugs, attach them to the floor with carpet tape.  Make sure that you have a light switch at the top of the stairs and the bottom of the stairs. If you do not have them, ask someone to add them for you. What else can I do to help prevent falls?  Wear shoes that: ? Do not have high heels. ? Have rubber bottoms. ? Are comfortable and fit you well. ? Are closed at the toe. Do not wear sandals.  If you use a stepladder: ? Make sure that it is fully opened. Do not climb a closed stepladder. ? Make sure that both sides of the stepladder are locked into place. ? Ask someone to hold it for you, if possible.  Clearly mark and make sure that you can see: ? Any grab bars or handrails. ? First and last steps. ? Where the edge of each step is.  Use tools  that help you move around (mobility aids) if they are needed. These include: ? Canes. ? Walkers. ? Scooters. ? Crutches.  Turn on the lights when you go into a dark area. Replace any light bulbs as soon as they burn out.  Set up your furniture so you have a clear path. Avoid moving your furniture around.  If any of your floors are uneven, fix them.  If there are any pets around you, be aware of where they are.  Review your medicines with your doctor. Some medicines can make you feel dizzy. This can increase your chance of falling. Ask your doctor what other things that you can do to help prevent falls. This information is not intended to replace advice given to you by your health care provider. Make sure you discuss any questions you have with your health care provider. Document Released: 08/30/2009 Document Revised: 04/10/2016 Document Reviewed: 12/08/2014 Elsevier Interactive Patient Education  2018 [employer].  Steps to Quit Smoking Smoking tobacco can be bad for your health. It can also affect almost every organ in your body. Smoking puts you and people around you at risk for many serious long-lasting (chronic) diseases. Quitting smoking is hard, but it is one of the best things that you can do for your health. It is never too late to quit. What are the benefits of quitting smoking? When you quit smoking, you lower your risk for getting serious diseases and conditions. They can include:  Lung cancer or lung disease.  Heart disease.  Stroke.  Heart attack.  Not being able to have children (infertility).  Weak bones (osteoporosis) and broken bones (fractures).  If you have coughing, wheezing, and shortness of breath, those symptoms may get better when you quit. You may also get sick less often. If you are pregnant, quitting smoking can help to lower your chances of having a baby of low birth weight. What can I do to help me quit smoking? Talk with your doctor about what  can help you quit smoking. Some things you can do (strategies) include:  Quitting smoking totally, instead of slowly cutting back how much you smoke over a period of time.  Going to in-person counseling. You are more likely to quit if you go to many counseling sessions.  Using resources and support systems, such as: ? Database administrator with a Social worker. ? Phone quitlines. ? Careers information officer. ? Support groups or group counseling. ? Text messaging programs. ? Mobile  phone apps or applications.  Taking medicines. Some of these medicines may have nicotine in them. If you are pregnant or breastfeeding, do not take any medicines to quit smoking unless your doctor says it is okay. Talk with your doctor about counseling or other things that can help you.  Talk with your doctor about using more than one strategy at the same time, such as taking medicines while you are also going to in-person counseling. This can help make quitting easier. What things can I do to make it easier to quit? Quitting smoking might feel very hard at first, but there is a lot that you can do to make it easier. Take these steps:  Talk to your family and friends. Ask them to support and encourage you.  Call phone quitlines, reach out to support groups, or work with a Social worker.  Ask people who smoke to not smoke around you.  Avoid places that make you want (trigger) to smoke, such as: ? Bars. ? Parties. ? Smoke-break areas at work.  Spend time with people who do not smoke.  Lower the stress in your life. Stress can make you want to smoke. Try these things to help your stress: ? Getting regular exercise. ? Deep-breathing exercises. ? Yoga. ? Meditating. ? Doing a body scan. To do this, close your eyes, focus on one area of your body at a time from head to toe, and notice which parts of your body are tense. Try to relax the muscles in those areas.  Download or buy apps on your mobile phone or tablet that can  help you stick to your quit plan. There are many free apps, such as QuitGuide from the [employer] Office manager for Disease Control and Prevention). You can find more support from smokefree.gov and other websites.  This information is not intended to replace advice given to you by your health care provider. Make sure you discuss any questions you have with your health care provider. Document Released: 08/30/2009 Document Revised: 07/01/2016 Document Reviewed: 03/20/2015 Elsevier Interactive Patient Education  2018 [employer].

## 2018-01-20 NOTE — Assessment & Plan Note (Signed)
Order DEXA; calcium rich diet; fall precautions; take 1000 iu of vitamin D3 once a day

## 2018-01-20 NOTE — Progress Notes (Signed)
BP 104/72 (BP Location: Left Arm, Patient Position: Sitting, Cuff Size: Normal)   Pulse 97   Temp 97.9 F (36.6 C) (Oral)   Wt 84 lb 6.4 oz (38.3 kg)   SpO2 98%   BMI 14.49 kg/m    Subjective:    Patient ID: Kaitlyn Gonzalez, female    DOB: 1936/07/11, 82 y.o.   MRN: [id number]  HPI: Kaitlyn Gonzalez is a 82 y.o. female  Chief Complaint  Patient presents with  . Wheelchair request     Pt thinks that she would benefit from wheelchair   . Follow-up    HPI Patient is new to me; previous provider left our practice  Smoking 1/2 day; down from before, down from 1.5 ppd; thought about her health; she was able to quit on her own; she can gain weight again  She would like a wheelchair; underweight; sometimes weak, especially if she doesn't eat; also has arthritis; mostly in the left side, hip and knee; just prescribed meloxicam in January; no kidney problems  She had a tailbone crack; was trying to do for herself, fell and cracked a tailbone; xrays reviewed; already did PT; does not want any more; chronic left side hip pain; going on for months; not related to stroke; she thought it was arthritis, but he got an xray   CLINICAL DATA:  Fall, low back pain.  EXAM: LUMBAR SPINE - 2-3 VIEW; SACRUM AND COCCYX - 2+ VIEW  COMPARISON:  None.  FINDINGS: Possible nondisplaced fracture through the sacrococcygeal junction. There is no evidence of lumbar spine fracture. 4 mm anterolisthesis of L4 on L5, likely due to facet arthropathy. Vertebral body heights are preserved. Intervertebral disc spaces are relatively maintained. Cholecystectomy. Aortoiliac atherosclerotic vascular disease. Prior iliac stenting bilaterally.  IMPRESSION: 1. Possible nondisplaced fracture through the sacrococcygeal junction. Correlate with point tenderness. 2. No lumbar spine fracture. 3. Grade 1 anterolisthesis at L4-L5 due to facet arthropathy. 4.  Aortic Atherosclerosis  (ICD10-I70.0).   Electronically Signed   By: Titus Dubin M.D.   On: 06/22/2017 09:15  Had a stroke which affected left side UE, not the leg; right-handed  She has always been small; but lost weight after having stent issues  HTN; BP is low  Depression screen [facility] 2/9 12/16/2017 09/09/2017 04/20/2017 04/09/2016 02/15/2016  Decreased Interest 0 0 0 0 0  Down, Depressed, Hopeless 0 0 0 0 0  PHQ - 2 Score 0 0 0 0 0    Relevant past medical, surgical, family and social history reviewed Past Medical History:  Diagnosis Date  . Dyslipidemia   . History of stroke with residual effects    Stroke in April 2016.  [place] Kitchen Hypertension   . Legally blind in right eye, as defined in [place]   . Peripheral vascular disease ([place])   . Stroke [facility])    Past Surgical History:  Procedure Laterality Date  . PERIPHERAL ARTERIAL STENT GRAFT Left 2010   Family History  Problem Relation Age of Onset  . Diabetes Sister   . Cancer Sister        breast  . Alzheimer's disease Mother   . Dementia Mother   . AAA (abdominal aortic aneurysm) Father    Social History   Tobacco Use  . Smoking status: Current Every Day Smoker    Packs/day: 0.25    Years: 30.00    Pack years: 7.50    Types: Cigarettes  . Smokeless tobacco: Never Used  Substance Use Topics  .  Alcohol use: No    Alcohol/week: 0.0 oz  . Drug use: No    Interim medical history since last visit reviewed. Allergies and medications reviewed  Review of Systems Per HPI unless specifically indicated above     Objective:    BP 104/72 (BP Location: Left Arm, Patient Position: Sitting, Cuff Size: Normal)   Pulse 97   Temp 97.9 F (36.6 C) (Oral)   Wt 84 lb 6.4 oz (38.3 kg)   SpO2 98%   BMI 14.49 kg/m   Wt Readings from Last 3 Encounters:  01/20/18 84 lb 6.4 oz (38.3 kg)  12/16/17 77 lb (34.9 kg)  09/09/17 76 lb 12.8 oz (34.8 kg)    Physical Exam  Constitutional: She appears well-developed and well-nourished.  Very thin,  underweight, appears malnourished  HENT:  Mouth/Throat: Mucous membranes are normal.  Eyes: EOM are normal. No scleral icterus.  Cardiovascular: Normal rate and regular rhythm.  Pulmonary/Chest: Effort normal and breath sounds normal.  Musculoskeletal:  Reduced muscle mass  Neurological:  Weakness, unsteady with standing  Psychiatric: She has a normal mood and affect. Her mood appears not anxious.    Results for orders placed or performed in visit on 09/09/17  COMPLETE METABOLIC PANEL WITH GFR  Result Value Ref Range   Glucose, Bld 98 65 - 139 mg/dL   BUN 8 7 - 25 mg/dL   Creat 0.46 (L) 0.60 - 0.93 mg/dL   GFR, Est Non African American 95 > OR = 60 mL/min/1.58m2   GFR, Est African American 110 > OR = 60 mL/min/1.87m2   BUN/Creatinine Ratio 17 6 - 22 (calc)   Sodium 139 135 - 146 mmol/L   Potassium 4.4 3.5 - 5.3 mmol/L   Chloride 103 98 - 110 mmol/L   CO2 28 20 - 32 mmol/L   Calcium 9.3 8.6 - 10.4 mg/dL   Total Protein 7.0 6.1 - 8.1 g/dL   Albumin 3.8 3.6 - 5.1 g/dL   Globulin 3.2 1.9 - 3.7 g/dL (calc)   AG Ratio 1.2 1.0 - 2.5 (calc)   Total Bilirubin 0.2 0.2 - 1.2 mg/dL   Alkaline phosphatase (APISO) 71 33 - 130 U/L   AST 15 10 - 35 U/L   ALT 11 6 - 29 U/L      Assessment & Plan:   Problem List Items Addressed This Visit      Cardiovascular and Mediastinum   Peripheral vascular disease ([place])    Managed by vascular doctor      Relevant Medications   amLODipine (NORVASC) 2.5 MG tablet   Misc. Devices [facility]) MISC   Hypertension    Reduce the CCB; recheck BP at home with home health nurse once a week for the next two weeks      Relevant Medications   amLODipine (NORVASC) 2.5 MG tablet     Musculoskeletal and Integument   Sacral fracture (H[facility])    Order wheelchair      Relevant Medications   Misc. Devices [facility]) MISC   Other Relevant Orders   DG Bone Density   Ambulatory referral to Orthopedic Surgery   Osteoporosis    Order DEXA; calcium rich  diet; fall precautions; take 1000 iu of vitamin D3 once a day      Relevant Orders   DG Bone Density   Anterolisthesis    Noted on xrays      Relevant Medications   Misc. Devices [facility]) MISC   Other Relevant Orders   Ambulatory referral to Orthopedic  Surgery     Other   Tobacco abuse    Encouraged cessation; I am here to help      Relevant Orders   CT CHEST LUNG CA SCREEN LOW DOSE W/O CM   Left leg pain    Related to stents; seeing vascular doctor; hip problem so will wheelchair      Relevant Medications   Misc. Devices [facility]) MISC   Chronic left hip pain    Refer to orthopaedist; reviewed xray report, did not show any arthritis      Relevant Medications   meloxicam (MOBIC) 7.5 MG tablet   Misc. Devices [facility]) MISC   Other Relevant Orders   Ambulatory referral to Orthopedic Surgery    Other Visit Diagnoses    Postmenopausal    -  Primary   Relevant Orders   DG Bone Density   DG Bone Density   Hx of tobacco use, presenting hazards to health       Relevant Orders   CT CHEST LUNG CA SCREEN LOW DOSE W/O CM       Follow up plan: Return in about 6 weeks (around 03/03/2018).  An after-visit summary was printed and given to the patient at [place].  Please see the patient instructions which may contain other information and recommendations beyond what is mentioned above in the assessment and plan.  Meds ordered this encounter  Medications  . amLODipine (NORVASC) 2.5 MG tablet    Sig: Take 1 tablet (2.5 mg total) by mouth daily.    Dispense:  90 tablet    Refill:  3    We're decreasing the dose; cancel the 5 mg strength refills  . meloxicam (MOBIC) 7.5 MG tablet    Sig: Take 1 tablet (7.5 mg total) by mouth daily as needed for pain.    Dispense:  90 tablet    Refill:  1  . Misc. Devices [facility]) MISC    Sig: Wheelchair for home use    Dispense:  1 each    Refill:  0    Orders Placed This Encounter  Procedures  . DG Bone Density  .  DG Bone Density  . CT CHEST LUNG CA SCREEN LOW DOSE W/O CM  . Ambulatory referral to Orthopedic Surgery

## 2018-01-20 NOTE — Assessment & Plan Note (Signed)
Reduce the CCB; recheck BP at home with home health nurse once a week for the next two weeks

## 2018-01-21 DIAGNOSIS — H25813 Combined forms of age-related cataract, bilateral: Secondary | ICD-10-CM | POA: Diagnosis not present

## 2018-01-27 ENCOUNTER — Telehealth: Payer: Self-pay | Admitting: *Deleted

## 2018-01-27 DIAGNOSIS — Z122 Encounter for screening for malignant neoplasm of respiratory organs: Secondary | ICD-10-CM

## 2018-01-27 DIAGNOSIS — Z87891 Personal history of nicotine dependence: Secondary | ICD-10-CM

## 2018-01-27 NOTE — Telephone Encounter (Signed)
Received referral for initial lung cancer screening scan. Contacted patient and obtained smoking history,(current, 94.5 pack year) as well as answering questions related to screening process. Patient denies signs of lung cancer such as weight loss or hemoptysis. Patient denies comorbidity that would prevent curative treatment if lung cancer were found. Patient is scheduled for shared decision making visit and CT scan on 02/10/18.

## 2018-01-28 ENCOUNTER — Telehealth: Payer: Self-pay | Admitting: Family Medicine

## 2018-01-28 NOTE — Telephone Encounter (Signed)
Completing form for wheelchair Son answered questions, form completed, faxed to [facility]

## 2018-02-09 ENCOUNTER — Encounter: Payer: Self-pay | Admitting: Oncology

## 2018-02-10 ENCOUNTER — Ambulatory Visit
Admission: RE | Admit: 2018-02-10 | Discharge: 2018-02-10 | Disposition: A | Discharge status: Home / Self Care | Payer: Medicare Other | Source: Ambulatory Visit | Attending: Oncology | Admitting: Oncology

## 2018-02-10 ENCOUNTER — Ambulatory Visit: Admission: RE | Admit: 2018-02-10 | Payer: Medicare Other | Source: Ambulatory Visit

## 2018-02-10 ENCOUNTER — Inpatient Hospital Stay: Payer: Medicare Other | Attending: Oncology | Admitting: Oncology

## 2018-02-10 DIAGNOSIS — Z8781 Personal history of (healed) traumatic fracture: Secondary | ICD-10-CM | POA: Diagnosis not present

## 2018-02-10 DIAGNOSIS — I700 Atherosclerosis of aorta: Secondary | ICD-10-CM | POA: Insufficient documentation

## 2018-02-10 DIAGNOSIS — Z122 Encounter for screening for malignant neoplasm of respiratory organs: Secondary | ICD-10-CM | POA: Diagnosis not present

## 2018-02-10 DIAGNOSIS — Z87891 Personal history of nicotine dependence: Secondary | ICD-10-CM

## 2018-02-10 DIAGNOSIS — I2510 Atherosclerotic heart disease of native coronary artery without angina pectoris: Secondary | ICD-10-CM | POA: Insufficient documentation

## 2018-02-10 DIAGNOSIS — J439 Emphysema, unspecified: Secondary | ICD-10-CM | POA: Insufficient documentation

## 2018-02-10 DIAGNOSIS — F17210 Nicotine dependence, cigarettes, uncomplicated: Secondary | ICD-10-CM | POA: Diagnosis not present

## 2018-02-11 NOTE — Progress Notes (Signed)
In accordance with CMS guidelines, patient has met eligibility criteria including age, absence of signs or symptoms of lung cancer.  Social History   Tobacco Use  . Smoking status: Current Every Day Smoker    Packs/day: 1.50    Years: 63.00    Pack years: 94.50    Types: Cigarettes  . Smokeless tobacco: Never Used  Substance Use Topics  . Alcohol use: No    Alcohol/week: 0.0 oz  . Drug use: No     A shared decision-making session was conducted prior to the performance of CT scan. This includes one or more decision aids, includes benefits and harms of screening, follow-up diagnostic testing, over-diagnosis, false positive rate, and total radiation exposure.  Counseling on the importance of adherence to annual lung cancer LDCT screening, impact of co-morbidities, and ability or willingness to undergo diagnosis and treatment is imperative for compliance of the program.  Counseling on the importance of continued smoking cessation for former smokers; the importance of smoking cessation for current smokers, and information about tobacco cessation interventions have been given to patient including [place] and 1800 quit [place] programs.  Written order for lung cancer screening with LDCT has been given to the patient and any and all questions have been answered to the best of my abilities.   Yearly follow up will be coordinated by Burgess Estelle, Thoracic Navigator.  Faythe Casa, NP 02/11/2018 7:15 AM

## 2018-02-15 ENCOUNTER — Telehealth: Payer: Self-pay | Admitting: *Deleted

## 2018-02-15 DIAGNOSIS — I77819 Aortic ectasia, unspecified site: Secondary | ICD-10-CM

## 2018-02-15 NOTE — Telephone Encounter (Signed)
Please ask patient to schedule an appointment to go over her CT scan findings In the meantime, we're going to get an ultrasound of her aorta, as it appears somewhat dilated

## 2018-02-15 NOTE — Telephone Encounter (Signed)
Pt son notified, given # to schedule [place] and she already has a f/u scheduled on 4/17.

## 2018-02-15 NOTE — Assessment & Plan Note (Signed)
Scan aorta

## 2018-02-15 NOTE — Addendum Note (Signed)
Addended by: Prakriti Carignan, Satira Anis on: 02/15/2018 03:03 PM   Modules accepted: Orders

## 2018-02-15 NOTE — Telephone Encounter (Signed)
Notified patient of LDCT lung cancer screening program results with recommendation for 12 month follow up imaging. Also notified of incidental findings noted below and is encouraged to discuss further with PCP who will receive a copy of this note and/or the CT report. Patient is expecting to discuss further imaging of abnormal aortic finding. Patient verbalizes understanding.   IMPRESSION: 1. Lung-RADS 2S, benign appearance or behavior. Continue annual screening with low-dose chest CT without contrast in 12 months. 2. The "S" modifier represents a potentially clinically significant non pulmonary finding. Incompletely imaged infrarenal aortic dilatation. Consider dedicated abdominal ultrasound. 3. Aortic atherosclerosis (ICD10-I70.0), coronary artery atherosclerosis and emphysema (ICD10-J43.9). 4. Esophageal air fluid level suggests dysmotility or gastroesophageal reflux. 5. Basilar predominant interstitial thickening is favored to be related to post infectious or inflammatory scarring. An atypical appearance of interstitial lung disease (mild honeycombing but no significant reticulation) possible but felt less likely. Recommend attention on follow-up.

## 2018-02-16 NOTE — Telephone Encounter (Signed)
Done. Placed on providers desk.

## 2018-02-16 NOTE — Telephone Encounter (Signed)
Please add ICD-10 to form and append and I'll sign

## 2018-02-16 NOTE — Telephone Encounter (Signed)
Needing DX codes, please call back to [facility] @ [phone]

## 2018-02-16 NOTE — Telephone Encounter (Signed)
If you would tell me what dx codes to use I am happy to call and inform them. Thanks

## 2018-03-03 ENCOUNTER — Encounter: Payer: Self-pay | Admitting: Family Medicine

## 2018-03-03 ENCOUNTER — Ambulatory Visit (INDEPENDENT_AMBULATORY_CARE_PROVIDER_SITE_OTHER): Payer: Medicare Other | Admitting: Family Medicine

## 2018-03-03 VITALS — BP 130/72 | HR 107 | Temp 98.3°F | Ht 64.0 in | Wt 83.6 lb

## 2018-03-03 DIAGNOSIS — Z5181 Encounter for therapeutic drug level monitoring: Secondary | ICD-10-CM

## 2018-03-03 DIAGNOSIS — S3210XD Unspecified fracture of sacrum, subsequent encounter for fracture with routine healing: Secondary | ICD-10-CM | POA: Diagnosis not present

## 2018-03-03 DIAGNOSIS — I10 Essential (primary) hypertension: Secondary | ICD-10-CM

## 2018-03-03 DIAGNOSIS — Z720 Tobacco use: Secondary | ICD-10-CM

## 2018-03-03 DIAGNOSIS — R634 Abnormal weight loss: Secondary | ICD-10-CM

## 2018-03-03 DIAGNOSIS — D649 Anemia, unspecified: Secondary | ICD-10-CM | POA: Diagnosis not present

## 2018-03-03 DIAGNOSIS — E785 Hyperlipidemia, unspecified: Secondary | ICD-10-CM | POA: Diagnosis not present

## 2018-03-03 DIAGNOSIS — R739 Hyperglycemia, unspecified: Secondary | ICD-10-CM | POA: Diagnosis not present

## 2018-03-03 DIAGNOSIS — R9389 Abnormal findings on diagnostic imaging of other specified body structures: Secondary | ICD-10-CM

## 2018-03-03 DIAGNOSIS — I77819 Aortic ectasia, unspecified site: Secondary | ICD-10-CM | POA: Diagnosis not present

## 2018-03-03 DIAGNOSIS — J439 Emphysema, unspecified: Secondary | ICD-10-CM | POA: Diagnosis not present

## 2018-03-03 DIAGNOSIS — Z8673 Personal history of transient ischemic attack (TIA), and cerebral infarction without residual deficits: Secondary | ICD-10-CM

## 2018-03-03 MED ORDER — METOPROLOL SUCCINATE ER 25 MG PO TB24: 25.0000 mg | ORAL_TABLET | Freq: Every day | ORAL | 3 refills | Status: DC

## 2018-03-03 NOTE — Patient Instructions (Addendum)
  I do encourage you to quit smoking Call [phone] to sign up for smoking cessation classes You can call 1-800-QUIT-NOW to talk with a smoking cessation coach  Please call [phone] to schedule your imaging test of your aorta Please wait 2-3 days after the order has been placed to call and get your test scheduled  Let's get your labs today If you have not heard anything from my staff in a week about any orders/referrals/studies from today, please contact us here to follow-up [phone]

## 2018-03-03 NOTE — Assessment & Plan Note (Signed)
Refer to pulmonologist; reviewed chest CT with patient; urged to quit smoking

## 2018-03-03 NOTE — Assessment & Plan Note (Signed)
Check cholesterol.

## 2018-03-03 NOTE — Assessment & Plan Note (Signed)
Scan has been ordered by [facility], patient will schedule

## 2018-03-03 NOTE — Assessment & Plan Note (Signed)
Check A1c. 

## 2018-03-03 NOTE — Assessment & Plan Note (Signed)
Hx of fracture; pain is controlled

## 2018-03-03 NOTE — Assessment & Plan Note (Signed)
Check TSH and free T4 

## 2018-03-03 NOTE — Assessment & Plan Note (Signed)
Not symptomatic; will start inhalers when needed

## 2018-03-03 NOTE — Progress Notes (Signed)
BP 130/72 (BP Location: Left Arm, Patient Position: Sitting, Cuff Size: Small)   Pulse (!) 107   Temp 98.3 F (36.8 C) (Oral)   Ht 5\' 4"  (1.626 m)   Wt 83 lb 9.6 oz (37.9 kg)   SpO2 93%   BMI 14.35 kg/m    Subjective:    Patient ID: Kaitlyn Gonzalez, female    DOB: [birth year], 82 y.o.   MRN: [id number]  HPI: Kaitlyn Gonzalez is a 82 y.o. female  Chief Complaint  Patient presents with  . Follow-up    HPI Patient is here for follow-up Wheelchair was requested last visit Sacral fracture, sacrococcygeal junction back in August 2018 Hx of stroke, affected left side UE; she is right-handed PVD; seeing vascular doctor, had stents put in and legs hurt all the time; not much exercise now She has lost almost a pound since last visit; no good appetite; smoking about 5 cigs; never tried to quit before; daughter and son smoke, but away from her She has been about 100 pounds all her life; never more than 110 in younger days Weight loss; no fam hx of thyroid I asked about cancer in the family, no close relatives No blood in the stool or urine; no pain anywhere other than the leg ((left) Anemia; back in August; "I've always been anemic" she says; quit taking iron, not sure when HTN; not taking amlodipine and beta-blocker High cholesterol; not taking her pills like she should Aortic athero, CAD; brother had a stroke, years ago; no MI in the family; no chest pain Fluid level in the esophagus, no trouble swallowing, no trouble with regurgitation; no heartburn Moderate emphysema  Depression screen [facility] 2/9 03/03/2018 12/16/2017 09/09/2017 04/20/2017 04/09/2016  Decreased Interest 0 0 0 0 0  Down, Depressed, Hopeless 0 0 0 0 0  PHQ - 2 Score 0 0 0 0 0    Relevant past medical, surgical, family and social history reviewed Past Medical History:  Diagnosis Date  . Dyslipidemia   . History of stroke with residual effects    Stroke in April 2016.  Marland Kitchen Hypertension   . Legally blind in  right eye, as defined in [place]   . Peripheral vascular disease ([place])   . Stroke [facility])    Past Surgical History:  Procedure Laterality Date  . PERIPHERAL ARTERIAL STENT GRAFT Left 2010   Family History  Problem Relation Age of Onset  . Diabetes Sister   . Cancer Sister        breast  . Alzheimer's disease Mother   . Dementia Mother   . AAA (abdominal aortic aneurysm) Father    Social History   Tobacco Use  . Smoking status: Current Every Day Smoker    Packs/day: 0.25    Years: 63.00    Pack years: 15.75    Types: Cigarettes  . Smokeless tobacco: Never Used  . Tobacco comment: Down to about 1/2 pack a day from 2ppd 03/03/2018  Substance Use Topics  . Alcohol use: No    Alcohol/week: 0.0 oz  . Drug use: No    Interim medical history since last visit reviewed. Allergies and medications reviewed  Review of Systems Per HPI unless specifically indicated above     Objective:    BP 130/72 (BP Location: Left Arm, Patient Position: Sitting, Cuff Size: Small)   Pulse (!) 107   Temp 98.3 F (36.8 C) (Oral)   Ht 5\' 4"  (1.626 m)   Wt 83 lb 9.6 oz (  37.9 kg)   SpO2 93%   BMI 14.35 kg/m   Wt Readings from Last 3 Encounters:  03/17/18 89 lb (40.4 kg)  03/17/18 89 lb (40.4 kg)  03/03/18 83 lb 9.6 oz (37.9 kg)    Physical Exam  Constitutional:  Very thin, elderly female, appears deconditioned, nontoxic  HENT:  Head: Normocephalic and atraumatic.  Cardiovascular: Normal rate and regular rhythm.  Pulmonary/Chest: Effort normal.  Abdominal: She exhibits no distension.  Musculoskeletal:  Decreased muscle mass, especially in the legs  Skin: Skin is warm. No pallor.  Psychiatric: She has a normal mood and affect.       Assessment & Plan:   Problem List Items Addressed This Visit      Cardiovascular and Mediastinum   Hypertension   Dilatation of aorta (HCC)    Scan has been ordered by Raquel Sarna, patient will schedule        Respiratory   Bullous emphysema ([place])    Not  symptomatic; will start inhalers when needed      Relevant Orders   Ambulatory referral to Pulmonology     Musculoskeletal and Integument   Sacral fracture ([place])    Hx of fracture; pain is controlled        Other   Tobacco abuse    Really encouraged smoking cessation      Relevant Orders   Ambulatory referral to Pulmonology   Hyperglycemia    Check A1c      Relevant Orders   Hemoglobin A1c (Completed)   History of stroke    Affects left arm, mild weakness of the LUE      Dyslipidemia    Check cholesterol      Relevant Orders   Lipid panel (Completed)   Abnormal chest CT    Refer to pulmonologist; reviewed chest CT with patient; urged to quit smoking      Relevant Orders   Ambulatory referral to Pulmonology    Other Visit Diagnoses    Weight loss    -  Primary   Relevant Orders   CBC with Differential/Platelet (Completed)   TSH + free T4 (Completed)   Anemia, unspecified type       Relevant Orders   CBC with Differential/Platelet (Completed)   Fe+TIBC+Fer (Completed)   Medication monitoring encounter       Relevant Orders   COMPLETE METABOLIC PANEL WITH GFR (Completed)       Follow up plan: Return in about 2 weeks (around 03/17/2018) for follow-up visit with Dr. Sanda Klein.  An after-visit summary was printed and given to the patient at [place].  Please see the patient instructions which may contain other information and recommendations beyond what is mentioned above in the assessment and plan.  Meds ordered this encounter  Medications  . DISCONTD: metoprolol succinate (TOPROL-XL) 25 MG 24 hr tablet    Sig: Take 1 tablet (25 mg total) by mouth daily.    Dispense:  90 tablet    Refill:  3    Orders Placed This Encounter  Procedures  . CBC with Differential/Platelet  . COMPLETE METABOLIC PANEL WITH GFR  . Hemoglobin A1c  . Lipid panel  . Fe+TIBC+Fer  . TSH + free T4  . Ambulatory referral to Pulmonology

## 2018-03-03 NOTE — Assessment & Plan Note (Signed)
Affects left arm, mild weakness of the LUE

## 2018-03-03 NOTE — Assessment & Plan Note (Addendum)
Really encouraged smoking cessation

## 2018-03-04 ENCOUNTER — Telehealth: Payer: Self-pay

## 2018-03-04 DIAGNOSIS — R634 Abnormal weight loss: Secondary | ICD-10-CM

## 2018-03-04 DIAGNOSIS — D649 Anemia, unspecified: Secondary | ICD-10-CM

## 2018-03-04 LAB — LIPID PANEL
CHOLESTEROL: 136 mg/dL (ref <200)
HDL: 63 mg/dL (ref >50)
LDL Cholesterol (Calc): 60 mg/dL (calc)
Non-HDL Cholesterol (Calc): 73 mg/dL (calc) (ref <130)
Total CHOL/HDL Ratio: 2.2 (calc) (ref <5.0)
Triglycerides: 58 mg/dL (ref <150)

## 2018-03-04 LAB — CBC WITH DIFFERENTIAL/PLATELET
Basophils Absolute: 27 cells/uL (ref 0–200)
Basophils Relative: 0.3 %
EOS PCT: 0.2 %
Eosinophils Absolute: 18 cells/uL (ref 15–500)
HEMATOCRIT: 27.9 % — AB (ref 35.0–45.0)
HEMOGLOBIN: 8.5 g/dL — AB (ref 11.7–15.5)
LYMPHS ABS: 1193 {cells}/uL (ref 850–3900)
MCH: 22.7 pg — ABNORMAL LOW (ref 27.0–33.0)
MCHC: 30.5 g/dL — AB (ref 32.0–36.0)
MCV: 74.6 fL — ABNORMAL LOW (ref 80.0–100.0)
MONOS PCT: 6.3 %
MPV: 9.4 fL (ref 7.5–12.5)
NEUTROS ABS: 7102 {cells}/uL (ref 1500–7800)
NEUTROS PCT: 79.8 %
Platelets: 447 10*3/uL — ABNORMAL HIGH (ref 140–400)
RBC: 3.74 10*6/uL — AB (ref 3.80–5.10)
RDW: 19.6 % — AB (ref 11.0–15.0)
Total Lymphocyte: 13.4 %
WBC mixed population: 561 cells/uL (ref 200–950)
WBC: 8.9 10*3/uL (ref 3.8–10.8)

## 2018-03-04 LAB — COMPLETE METABOLIC PANEL WITH GFR
AG Ratio: 1.3 (calc) (ref 1.0–2.5)
ALT: 7 U/L (ref 6–29)
AST: 12 U/L (ref 10–35)
Albumin: 4.1 g/dL (ref 3.6–5.1)
Alkaline phosphatase (APISO): 102 U/L (ref 33–130)
BILIRUBIN TOTAL: 0.3 mg/dL (ref 0.2–1.2)
BUN: 11 mg/dL (ref 7–25)
CHLORIDE: 105 mmol/L (ref 98–110)
CO2: 28 mmol/L (ref 20–32)
CREATININE: 0.66 mg/dL (ref 0.60–0.93)
Calcium: 9.6 mg/dL (ref 8.6–10.4)
GFR, Est African American: 97 mL/min/{1.73_m2} (ref >=60)
GFR, Est Non African American: 84 mL/min/{1.73_m2} (ref >=60)
GLUCOSE: 104 mg/dL (ref 65–139)
Globulin: 3.2 g/dL (calc) (ref 1.9–3.7)
Potassium: 4.6 mmol/L (ref 3.5–5.3)
Sodium: 140 mmol/L (ref 135–146)
TOTAL PROTEIN: 7.3 g/dL (ref 6.1–8.1)

## 2018-03-04 LAB — IRON,TIBC AND FERRITIN PANEL
%SAT: 8 % — AB (ref 11–50)
FERRITIN: 12 ng/mL — AB (ref 20–288)
Iron: 39 ug/dL — ABNORMAL LOW (ref 45–160)
TIBC: 465 ug/dL — AB (ref 250–450)

## 2018-03-04 LAB — HEMOGLOBIN A1C
Hgb A1c MFr Bld: 5 % of total Hgb (ref <5.7)
Mean Plasma Glucose: 97 (calc)
eAG (mmol/L): 5.4 (calc)

## 2018-03-04 LAB — TSH+FREE T4: TSH W/REFLEX TO FT4: 2.42 mIU/L (ref 0.40–4.50)

## 2018-03-04 NOTE — Telephone Encounter (Signed)
Called pt's son, informed him of lab results and the need for urgent referral. Son gave verbal understanding. Referral placed as urgent.

## 2018-03-04 NOTE — Telephone Encounter (Signed)
-----   Message from Arnetha Courser, MD sent at 03/04/2018  8:14 AM EDT ----- Guerry Minors, please let the patient know that she is anemic, and appears to be losing blood. The gastrointestinal tract is a key suspect. Please refer to GI, high priority, dx anemia, weight loss; she does not have diabetes or thyroid disease; thank you

## 2018-03-11 ENCOUNTER — Encounter: Payer: Self-pay | Admitting: Neurology

## 2018-03-16 ENCOUNTER — Telehealth: Payer: Self-pay | Admitting: Family Medicine

## 2018-03-16 ENCOUNTER — Telehealth: Payer: Self-pay

## 2018-03-16 NOTE — Telephone Encounter (Signed)
Copied from [place] [id number]. Topic: Quick Communication - See Telephone Encounter >> Mar 16, 2018  9:56 AM Rutherford Nail, NT wrote: CRM for notification. See Telephone encounter for: 03/16/18. Carrie from Ultrasound scheduling states that the order for patient's appointment tomorrow needs to be changed to: ultrasound aorta limited.  States that you can leave the current order in so the patient does not lose her appointment, and she would fix it. >> Mar 16, 2018 12:30 PM Vernona Rieger wrote: Colletta [place] from [place] center called and said that she corrected the order. She just needs Dr Sanda Klein to e-sign it. Please call if you have any questions [phone]

## 2018-03-16 NOTE — Telephone Encounter (Signed)
Can you print and I will sign it?

## 2018-03-16 NOTE — Telephone Encounter (Signed)
Copied from [place] [id number]. Topic: Quick Communication - See Telephone Encounter >> Mar 16, 2018  9:56 AM Rutherford Nail, NT wrote: CRM for notification. See Telephone encounter for: 03/16/18. Carrie from Ultrasound scheduling states that the order for patient's appointment tomorrow(03/17/18) needs to be changed to: ultrasound aorta limited.  States that you can leave the current order in so the patient does not lose her appointment, and she would fix it.

## 2018-03-17 ENCOUNTER — Encounter: Payer: Self-pay | Admitting: Nurse Practitioner

## 2018-03-17 ENCOUNTER — Ambulatory Visit (INDEPENDENT_AMBULATORY_CARE_PROVIDER_SITE_OTHER): Payer: Medicare Other | Admitting: Nurse Practitioner

## 2018-03-17 ENCOUNTER — Ambulatory Visit
Admission: RE | Admit: 2018-03-17 | Discharge: 2018-03-17 | Disposition: A | Discharge status: Home / Self Care | Payer: Medicare Other | Source: Ambulatory Visit | Attending: Family Medicine | Admitting: Family Medicine

## 2018-03-17 ENCOUNTER — Encounter: Payer: Self-pay | Admitting: Internal Medicine

## 2018-03-17 ENCOUNTER — Ambulatory Visit (INDEPENDENT_AMBULATORY_CARE_PROVIDER_SITE_OTHER): Payer: Medicare Other | Admitting: Internal Medicine

## 2018-03-17 VITALS — BP 110/64 | HR 68 | Temp 98.4°F | Resp 16 | Ht 64.0 in | Wt 89.0 lb

## 2018-03-17 VITALS — BP 100/60 | HR 77 | Ht 64.0 in | Wt 89.0 lb

## 2018-03-17 DIAGNOSIS — J8410 Pulmonary fibrosis, unspecified: Secondary | ICD-10-CM

## 2018-03-17 DIAGNOSIS — I700 Atherosclerosis of aorta: Secondary | ICD-10-CM | POA: Insufficient documentation

## 2018-03-17 DIAGNOSIS — E785 Hyperlipidemia, unspecified: Secondary | ICD-10-CM

## 2018-03-17 DIAGNOSIS — I77819 Aortic ectasia, unspecified site: Secondary | ICD-10-CM

## 2018-03-17 DIAGNOSIS — Z23 Encounter for immunization: Secondary | ICD-10-CM

## 2018-03-17 DIAGNOSIS — D649 Anemia, unspecified: Secondary | ICD-10-CM | POA: Diagnosis not present

## 2018-03-17 DIAGNOSIS — I10 Essential (primary) hypertension: Secondary | ICD-10-CM

## 2018-03-17 DIAGNOSIS — I714 Abdominal aortic aneurysm, without rupture: Secondary | ICD-10-CM | POA: Diagnosis not present

## 2018-03-17 DIAGNOSIS — J439 Emphysema, unspecified: Secondary | ICD-10-CM | POA: Diagnosis not present

## 2018-03-17 MED ORDER — ATORVASTATIN CALCIUM 40 MG PO TABS: 40.0000 mg | ORAL_TABLET | Freq: Every day | ORAL | 0 refills | Status: DC

## 2018-03-17 MED ORDER — FLUTICASONE-SALMETEROL 115-21 MCG/ACT IN AERO: 2.0000 | INHALATION_SPRAY | Freq: Two times a day (BID) | RESPIRATORY_TRACT | 12 refills | Status: DC

## 2018-03-17 MED ORDER — METOPROLOL SUCCINATE ER 25 MG PO TB24: 25.0000 mg | ORAL_TABLET | Freq: Every day | ORAL | 3 refills | Status: DC

## 2018-03-17 MED ORDER — PNEUMOCOCCAL 13-VAL CONJ VACC IM SUSP
0.5000 mL | INTRAMUSCULAR | Status: AC
  Administered 2018-03-24: 0.5 mL via INTRAMUSCULAR

## 2018-03-17 MED ORDER — ASPIRIN EC 81 MG PO TBEC: 81.0000 mg | DELAYED_RELEASE_TABLET | Freq: Every day | ORAL | 0 refills | Status: DC

## 2018-03-17 NOTE — Patient Instructions (Addendum)
Will start you on advair; 2 puffs twice per day.  Will check a blood test for emphysema.  Will check overnight oxygen to see if you need oxygen at night.   --Quitting smoking is the most important thing that you can do for your health.  --Quitting smoking will have greater affect on your health than any medicine that we can give you.

## 2018-03-17 NOTE — Addendum Note (Signed)
Addended by: Oscar La R on: 03/17/2018 03:08 PM   Modules accepted: Orders

## 2018-03-17 NOTE — Telephone Encounter (Signed)
Order has been printed and placed on your desk.

## 2018-03-17 NOTE — Progress Notes (Signed)
[facility] Consultation      Assessment and Plan:  COPD/emphysema with dyspnea on exertion with changes of pulmonary fibrosis, (does not appear typical of UIP/idiopathic pulmonary fibrosis). - Discussed severe emphysematous changes and fibrotic changes, reviewed imaging with patient and sons. - Discussed best option for reducing continued advancement of COPD is complete smoking cessation. - Discussed that starting an inhaler could help with her dyspnea on exertion, will start Advair. - We will check alpha-1, will administer Prevnar 13 vaccine today.  Preventive care: - Alpha-1 testing 03/17/2018 - Prevnar vaccine administered 03/17/2018. - Currently enrolled in lung cancer screening. - Smoking, discussed cessation  Nicotine abuse. - Advised patient that complete smoking cessation is the best thing she can do for her health.  Spent 3 minutes of discussion.  Noted that multiple family members at home smoke, given patient's emphysema.  Increased risk of developing emphysema themselves.  Deconditioning/weakness. -This is multifactorial due to previous stroke, advanced age and multiple comorbidities.  She has completed physical therapy previously, recently had a fall and has been hesitant to walk again.  Encourage patient to walk with walker to try to maintain her strength.  Orders Placed This Encounter  Procedures  . Pulse oximetry, overnight    Standing Status:   Future    Standing Expiration Date:   03/17/2019    Scheduling Instructions:     On RA   Meds ordered this encounter  Medications  . fluticasone-salmeterol (ADVAIR HFA) 115-21 MCG/ACT inhaler    Sig: Inhale 2 puffs into the lungs 2 (two) times daily. Rinse mouth after use.    Dispense:  1 Inhaler    Refill:  12   Return in about 3 months (around 06/17/2018).   Date: 03/17/2018  MRN# [id number] Kaitlyn Gonzalez 28-Jul-1936  Referring Physician: Dr. Sanda Klein for dyspnea and emphysema.   Kaitlyn Gonzalez is a 82 y.o. old female seen in consultation for chief complaint of:    Chief Complaint  Patient presents with  . Consult    abnormal CXR: prod Cough: sneezing:     HPI:   She underwent CT lung cancer screening which showed severe emphysema and basilar fibrosis. She lives with her kids. She does not drive a car, she gets winded with mild activity such as dressing or walking, bathing. She is smoking 5 cigs per day or less, used to smoke 1 ppd until 2015.  She takes no inhalers at home.  She fells and broke her tail bone, her legs are very weak, she has left sided weakness due to stroke, so she can not by herself. She needs help with bathing, she needs help with ambulating around her house. She has a walker and uses it occasionally.  Denies reflux.   Imaging personally reviewed; CT chest low-dose 02/10/2018.  Very severe emphysematous changes throughout both lungs, most severe in the apices, but also affecting the bases,  with moderate to severe bibasilar fibrotic changes.  Repeat scan in 1 year was recommended for lung cancer screening.   PMHX:   Past Medical History:  Diagnosis Date  . Dyslipidemia   . History of stroke with residual effects    Stroke in April 2016.  Marland Kitchen Hypertension   . Legally blind in right eye, as defined in [place]   . Peripheral vascular disease ([place])   . Stroke [facility])    Surgical Hx:  Past Surgical History:  Procedure Laterality Date  . PERIPHERAL ARTERIAL STENT GRAFT Left 2010   Family  Hx:  Family History  Problem Relation Age of Onset  . Diabetes Sister   . Cancer Sister        breast  . Alzheimer's disease Mother   . Dementia Mother   . AAA (abdominal aortic aneurysm) Father    Social Hx:   Social History   Tobacco Use  . Smoking status: Current Every Day Smoker    Packs/day: 0.25    Years: 63.00    Pack years: 15.75    Types: Cigarettes  . Smokeless tobacco: Never Used  . Tobacco comment: Down to about 1/2 pack a day from 2ppd  03/03/2018  Substance Use Topics  . Alcohol use: No    Alcohol/week: 0.0 oz  . Drug use: No   Medication:    Current Outpatient Medications:  .  aspirin EC 81 MG tablet, Take 1 tablet (81 mg total) by mouth daily., Disp: 90 tablet, Rfl: 0 .  atorvastatin (LIPITOR) 40 MG tablet, Take 1 tablet (40 mg total) by mouth daily at 6 PM., Disp: 90 tablet, Rfl: 0 .  metoprolol succinate (TOPROL-XL) 25 MG 24 hr tablet, Take 1 tablet (25 mg total) by mouth daily., Disp: 90 tablet, Rfl: 3 .  Misc. Devices [facility]) MISC, Wheelchair for home use, Disp: 1 each, Rfl: 0   Allergies:  Patient has no known allergies.  Review of Systems: Gen:  Denies  fever, sweats, chills HEENT: Denies blurred vision, double vision. bleeds, sore throat Cvc:  No dizziness, chest pain. Resp:   Denies cough or sputum production, shortness of breath Gi: Denies swallowing difficulty, stomach pain. Gu:  Denies bladder incontinence, burning urine Ext:   No Joint pain, stiffness. Skin: No skin rash,  hives  Endoc:  No polyuria, polydipsia. Psych: No depression, insomnia. Other:  All other systems were reviewed with the patient and were negative other that what is mentioned in the HPI.   Physical Examination:   VS: BP 100/60 (BP Location: Left Arm, Cuff Size: Normal)   Pulse 77   Ht 5\' 4"  (1.626 m)   Wt 89 lb (40.4 kg)   SpO2 98%   BMI 15.28 kg/m   General Appearance: No distress  Neuro:without focal findings,  speech normal,  HEENT: PERRLA, EOM intact.   Pulmonary: normal breath sounds, No wheezing.  CardiovascularNormal S1,S2.  No m/r/g.   Abdomen: Benign, Soft, non-tender. Renal:  No costovertebral tenderness  GU:  No performed at this time. Endoc: No evident thyromegaly, no signs of acromegaly. Skin:   warm, no rashes, no ecchymosis  Extremities: normal, no cyanosis, clubbing.  Other findings:    LABORATORY PANEL:   CBC No results for input(s): WBC, HGB, HCT, PLT in the last 168  hours. ------------------------------------------------------------------------------------------------------------------  Chemistries  No results for input(s): NA, K, CL, CO2, GLUCOSE, BUN, CREATININE, CALCIUM, MG, AST, ALT, ALKPHOS, BILITOT in the last 168 hours.  Invalid input(s): GFRCGP ------------------------------------------------------------------------------------------------------------------  Cardiac Enzymes No results for input(s): TROPONINI in the last 168 hours. ------------------------------------------------------------  RADIOLOGY:  US Aorta/ivc/iliacs [place]  Result Date: 03/17/2018 CLINICAL DATA:  82 year old female with a history of aortic dilatation and smoking EXAM: ULTRASOUND OF ABDOMINAL AORTA TECHNIQUE: Ultrasound examination of the abdominal aorta was performed to evaluate for abdominal aortic aneurysm. COMPARISON:  None. FINDINGS: Abdominal aortic measurements as follows: Proximal:  2.6 cm Mid:  3.3 cm Distal:  4.0 cm Right common iliac artery: 1.2 cm Left common iliac artery: 0.9 cm Distal aorta: Patent with color flow. 18.9 centimeters/second peak systolic velocity. IMPRESSION:  Fusiform aneurysmal dilatation of the infrarenal abdominal aorta with a maximal diameter of 4.0 cm. There is some peripheral wall adherent thrombus as well as atherosclerotic plaque. Recommend followup by ultrasound in 1 year. This recommendation follows ACR consensus guidelines: White Paper of the ACR Incidental Findings Committee II on Vascular Findings. J Am Coll Radiol 2013; 10:789-794. Signed, Criselda Peaches, MD [facility] Electronically Signed   By: Jacqulynn Cadet M.D.   On: 03/17/2018 10:50       Thank  you for the consultation and for allowing [facility] to assist in the care of your patient. Our recommendations are noted above.  Please contact us if we can be of further service.   Marda Stalker, MD.  Board Certified in Internal Medicine, Pulmonary Medicine, [place], and Sleep Medicine.  [facility] Office Number: [phone]  Patricia Pesa, M.D.  Merton Border, M.D  03/17/2018

## 2018-03-17 NOTE — Patient Instructions (Addendum)
-   take daily baby aspirin sent a prescription but this is over the counter if its cheaper that way - take daily multivitamin with iron supplements - Follow up with lung doctor today, stomach doctor on 6/5 at 1:45 -  Half a tablet of fexofenadine 30mg  dose daily OR loratadine 10mg  only as needed- use for the shortest time needed.

## 2018-03-17 NOTE — Progress Notes (Addendum)
Name: Kaitlyn Gonzalez   MRN: [id number]    DOB: [birth year]   Date:03/17/2018       Progress Note  Subjective  Chief Complaint  Chief Complaint  Patient presents with  . Follow-up    HPI  Patient presents for follow-up  with oldest and youngest son. Patient has wheelchair which helps with mobility. Endorses cold symptoms ongoing for about 2 months. Patient has sneezing, runny nose, decreased appetite, watery eyes. Still smoking 5-10 cigarettes a day. No fevers, chills, fatigue, shob. Has pulm appointment this afternoon.  [place] completed this morning. Impression: Fusiform aneurysmal dilatation of the infrarenal abdominal aorta with a maximal diameter of 4.0 cm. There is some peripheral wall adherent thrombus as well as atherosclerotic plaque. Recommend followup by ultrasound in 1 year. Patient takes baby aspirin when she thinks about it maybe once a week. Patient states ran out of cholesterol medication last week. Pt is taking metoprolol daily. Denies myalgias, chest pain, headaches, vision changes.   BP Readings from Last 3 Encounters:  03/17/18 100/60  03/17/18 110/64  03/03/18 130/72   Lab Results  Component Value Date   CHOL 136 03/03/2018   HDL 63 03/03/2018   LDLCALC 60 03/03/2018   TRIG 58 03/03/2018   CHOLHDL 2.2 03/03/2018     Patients hemoglobin was low two weeks ago. Patient denies GI bleeding, dark tarry stools, no blood in urine, or vaginal bleeding sts has a history of anemia when and took iron supplements in the past. Has GI appointment.    Patient Active Problem List   Diagnosis Date Noted  . Bullous emphysema ([place]) 03/03/2018  . Abnormal chest CT 03/03/2018  . Dilatation of aorta (HCC) 02/15/2018  . [place]ia 01/20/2018  . Blind right eye 01/20/2018  . Compulsive tobacco user syndrome 01/20/2018  . Left leg pain 01/20/2018  . Anterolisthesis 01/20/2018  . Chronic left hip pain 01/20/2018  . Osteoporosis 01/20/2018  . Tobacco abuse 01/20/2018  .  History of stroke 09/09/2017  . Sacral fracture ([place]) 09/09/2017  . Hyperglycemia 02/16/2017  . Tachycardia with 100 - 120 beats per minute 04/09/2016  . Hypertension 02/15/2016  . Peripheral vascular disease ([place]) 02/15/2016  . Dyslipidemia 02/15/2016  . History of stroke with residual effects 02/15/2016  . Heart murmur on physical examination 02/15/2016    Past Medical History:  Diagnosis Date  . Dyslipidemia   . History of stroke with residual effects    Stroke in April 2016.  Marland Kitchen Hypertension   . Legally blind in right eye, as defined in [place]   . Peripheral vascular disease ([place])   . Stroke [facility])     Past Surgical History:  Procedure Laterality Date  . PERIPHERAL ARTERIAL STENT GRAFT Left 2010    Social History   Tobacco Use  . Smoking status: Current Every Day Smoker    Packs/day: 0.50    Years: 63.00    Pack years: 31.50    Types: Cigarettes  . Smokeless tobacco: Never Used  . Tobacco comment: Down to about 1/2 pack a day from 2ppd 03/03/2018  Substance Use Topics  . Alcohol use: No    Alcohol/week: 0.0 oz     Current Outpatient Medications:  .  aspirin EC 81 MG tablet, Take 81 mg by mouth daily., Disp: , Rfl:  .  atorvastatin (LIPITOR) 40 MG tablet, Take 1 tablet (40 mg total) by mouth daily at 6 PM., Disp: 90 tablet, Rfl: 0 .  metoprolol succinate (TOPROL-XL) 25 MG 24 hr tablet,  Take 1 tablet (25 mg total) by mouth daily., Disp: 90 tablet, Rfl: 3 .  Misc. Devices [facility]) MISC, Wheelchair for home use, Disp: 1 each, Rfl: 0  No Known Allergies  ROS  Constitutional: Negative for fever or weight change.  Respiratory: Positive for dry cough and shortness of breath.   Cardiovascular: Negative for chest pain or palpitations.  Gastrointestinal: Negative for abdominal pain, no bowel changes.  Musculoskeletal: Positive for gait problem- uses wheelchair denies joint swelling.  Skin: Negative for rash.  Neurological: Negative for dizziness or headache.  No  other specific complaints in a complete review of systems (except as listed in HPI above).  Objective  Vitals:   03/17/18 1100  BP: 110/64  Pulse: 68  Resp: 16  Temp: 98.4 F (36.9 C)  TempSrc: Oral  SpO2: 99%  Weight: 84 lb (38.1 kg)  Height: 5\' 4"  (1.626 m)     Body mass index is 14.42 kg/m.  Nursing Note and Vital Signs reviewed.  Physical Exam  Constitutional: Patient appears in No distress, sitting in wheelchair.  HEENT: head atraumatic, normocephalic, Left pupil equal and reactive ;right eye deformity sts since childhood-stable; TM's without erythema or bulging, , no maxillary or frontal sinus tenderness , neck supple without lymphadenopathy, oropharynx pink and moist without exudate, no nasal discharge Cardiovascular: Normal rate, regular rhythm, S1/S2 present.  No murmur or rub heard.  Pulmonary/Chest: Effort normal and breath sounds clear. No respiratory distress or retractions. Abdominal: Soft and non-tender, bowel sounds present  Psychiatric: Patient has a normal mood and affect. behavior is normal. Judgment and thought content normal.  No results found for this or any previous visit (from the past 72 hour(s)).  Assessment & Plan  1. Atherosclerosis of abdominal aorta HCC) - aspirin   2. Dyslipidemia - atorvastatin (LIPITOR) 40 MG tablet; Take 1 tablet (40 mg total) by mouth daily at 6 PM.  Dispense: 90 tablet; Refill: 0  3. Hypertension, unspecified type - metoprolol succinate (TOPROL-XL) 25 MG 24 hr tablet; Take 1 tablet (25 mg total) by mouth daily.  Dispense: 90 tablet; Refill: 3  4. Aortic dilatation (HCC) Rate controlled, pt on BB. Will discuss referral to vascular surgery with Dr. Sanda Klein  5. Anemia History of iron deficiency anemia per patient. Discussed taking iron supplements. Pt denies bleeding, fatigue, shob or cp.  Pt hgb, ferritin and iron low and TIBC high. Keep follow-up with GI   Follow-up with Dr. Sanda Klein in three months or sooner with new  symptoms.   -Red flags and when to present for emergency care or RTC including fever >101.16F, chest pain, shortness of breath, fatigue, bleeding new/worsening/un-resolving symptoms,  reviewed with patient at time of visit. Follow up and care instructions discussed and provided in AVS.  --------------------------------------------- I agree with vascular surgery referral; entered I have reviewed this encounter including the documentation in this note and/or discussed this patient with the provider, Suezanne Cheshire DNP AGNP-C. I am certifying that I agree with the content of this note as supervising physician. Enid Derry, [facility] 03/21/2018, 8:13 PM

## 2018-03-18 ENCOUNTER — Other Ambulatory Visit: Payer: Self-pay | Admitting: Family Medicine

## 2018-03-18 DIAGNOSIS — I77819 Aortic ectasia, unspecified site: Secondary | ICD-10-CM

## 2018-03-18 DIAGNOSIS — D509 Iron deficiency anemia, unspecified: Secondary | ICD-10-CM

## 2018-03-18 DIAGNOSIS — I700 Atherosclerosis of aorta: Secondary | ICD-10-CM

## 2018-03-18 NOTE — Progress Notes (Unsigned)
c 

## 2018-03-21 NOTE — Addendum Note (Signed)
Addended by: Cortez Flippen, Satira Anis on: 03/21/2018 08:13 PM   Modules accepted: Orders

## 2018-03-24 NOTE — Addendum Note (Signed)
Addended by: Oscar La R on: 03/24/2018 03:55 PM   Modules accepted: Orders

## 2018-03-28 ENCOUNTER — Encounter: Payer: Self-pay | Admitting: Internal Medicine

## 2018-03-28 DIAGNOSIS — J439 Emphysema, unspecified: Secondary | ICD-10-CM

## 2018-03-30 ENCOUNTER — Other Ambulatory Visit: Payer: Self-pay | Admitting: Internal Medicine

## 2018-03-30 DIAGNOSIS — J449 Chronic obstructive pulmonary disease, unspecified: Secondary | ICD-10-CM | POA: Diagnosis not present

## 2018-03-30 DIAGNOSIS — J439 Emphysema, unspecified: Secondary | ICD-10-CM

## 2018-04-05 ENCOUNTER — Encounter (INDEPENDENT_AMBULATORY_CARE_PROVIDER_SITE_OTHER): Payer: Self-pay | Admitting: Vascular Surgery

## 2018-04-06 ENCOUNTER — Encounter (INDEPENDENT_AMBULATORY_CARE_PROVIDER_SITE_OTHER): Payer: Medicare Other | Admitting: Vascular Surgery

## 2018-04-15 ENCOUNTER — Encounter (INDEPENDENT_AMBULATORY_CARE_PROVIDER_SITE_OTHER): Payer: Medicare Other | Admitting: Vascular Surgery

## 2018-04-21 ENCOUNTER — Ambulatory Visit: Payer: Medicaid Other | Admitting: Gastroenterology

## 2018-04-28 ENCOUNTER — Encounter (INDEPENDENT_AMBULATORY_CARE_PROVIDER_SITE_OTHER): Payer: Self-pay | Admitting: Vascular Surgery

## 2018-04-28 ENCOUNTER — Ambulatory Visit (INDEPENDENT_AMBULATORY_CARE_PROVIDER_SITE_OTHER): Payer: Medicare Other | Admitting: Vascular Surgery

## 2018-04-28 VITALS — BP 110/68 | HR 94 | Resp 16 | Ht 66.0 in | Wt 98.0 lb

## 2018-04-28 DIAGNOSIS — I714 Abdominal aortic aneurysm, without rupture, unspecified: Secondary | ICD-10-CM | POA: Insufficient documentation

## 2018-04-28 DIAGNOSIS — R739 Hyperglycemia, unspecified: Secondary | ICD-10-CM

## 2018-04-28 DIAGNOSIS — E785 Hyperlipidemia, unspecified: Secondary | ICD-10-CM

## 2018-04-28 DIAGNOSIS — Z720 Tobacco use: Secondary | ICD-10-CM

## 2018-04-28 DIAGNOSIS — I739 Peripheral vascular disease, unspecified: Secondary | ICD-10-CM

## 2018-04-28 NOTE — Progress Notes (Signed)
Subjective:    Patient ID: Kaitlyn Gonzalez, female    DOB: Dec 09, 1935, 82 y.o.   MRN: [id number] Chief Complaint  Patient presents with  . New Patient (Initial Visit)    ref Vidal Schwalbe for AAA   Presents as a new patient (last seen in 2015) at the request of Dr. Vidal Schwalbe for evaluation of a AAA.  An aortoiliac arterial duplex performed at [facility] on Mar 17, 2018 was notable for "fusiform aneurysmal dilatation of the infrarenal abdominal aorta with a maximal diameter of 4.0 cm. There is some peripheral wall adherent thrombus as well as atherosclerotic plaque." The patient denies any symptoms such as back pain, pulsatile abdominal masses or thrombosis in his extremities.  The patient does have a past medical history of peripheral artery disease however at this time denies any claudication-like symptoms, rest pain or ulceration to the bilateral lower extremity.  The patient is an active smoker.  Patient's blood pressure today was 110/68.  The patient denies any fever, nausea vomiting.  Review of Systems  Constitutional: Negative.   HENT: Negative.   Eyes: Negative.   Respiratory: Negative.   Cardiovascular: Negative.   Gastrointestinal:       AAA  Endocrine: Negative.   Genitourinary: Negative.   Musculoskeletal: Negative.   Skin: Negative.   Allergic/Immunologic: Negative.   Neurological: Negative.   Hematological: Negative.   Psychiatric/Behavioral: Negative.       Objective:   Physical Exam  Constitutional: She is oriented to person, place, and time. She appears well-developed and well-nourished. No distress.  HENT:  Head: Normocephalic and atraumatic.  Right Ear: External ear normal.  Left Ear: External ear normal.  Eyes: Pupils are equal, round, and reactive to light. Conjunctivae and EOM are normal.  Neck: Normal range of motion.  Cardiovascular: Normal rate, regular rhythm, normal heart sounds and intact distal pulses.  Pulses:      Radial pulses  are 2+ on the right side, and 2+ on the left side.       Dorsalis pedis pulses are 1+ on the right side, and 1+ on the left side.       Posterior tibial pulses are 1+ on the right side, and 1+ on the left side.  Pulmonary/Chest: Effort normal and breath sounds normal.  Abdominal: Soft. Bowel sounds are normal. She exhibits no distension. There is no tenderness. There is no guarding.  Musculoskeletal: Normal range of motion. She exhibits no edema.  Neurological: She is alert and oriented to person, place, and time.  Skin: Skin is warm and dry. She is not diaphoretic.  Psychiatric: She has a normal mood and affect. Her behavior is normal. Judgment and thought content normal.  Vitals reviewed.  BP 110/68 (BP Location: Right Arm)   Pulse 94   Resp 16   Ht 5\' 6"  (1.676 m)   Wt 98 lb (44.5 kg)   BMI 15.82 kg/m   Past Medical History:  Diagnosis Date  . Dyslipidemia   . History of stroke with residual effects    Stroke in April 2016.  Marland Kitchen Hypertension   . Legally blind in right eye, as defined in [place]   . Peripheral vascular disease ([place])   . Stroke [facility])    Social History   Socioeconomic History  . Marital status: Divorced    Spouse name: Not on file  . Number of children: Not on file  . Years of education: Not on file  . Highest education level: Not on  file  Occupational History  . Not on file  Social Needs  . Financial resource strain: Not on file  . Food insecurity:    Worry: Not on file    Inability: Not on file  . Transportation needs:    Medical: Not on file    Non-medical: Not on file  Tobacco Use  . Smoking status: Current Every Day Smoker    Packs/day: 0.25    Years: 63.00    Pack years: 15.75    Types: Cigarettes  . Smokeless tobacco: Never Used  . Tobacco comment: Down to about 1/2 pack a day from 2ppd 03/03/2018  Substance and Sexual Activity  . Alcohol use: No    Alcohol/week: 0.0 oz  . Drug use: No  . Sexual activity: Not Currently  Lifestyle  .  Physical activity:    Days per week: Not on file    Minutes per session: Not on file  . Stress: Not on file  Relationships  . Social connections:    Talks on phone: Not on file    Gets together: Not on file    Attends religious service: Not on file    Active member of club or organization: Not on file    Attends meetings of clubs or organizations: Not on file    Relationship status: Not on file  . Intimate partner violence:    Fear of current or ex partner: Not on file    Emotionally abused: Not on file    Physically abused: Not on file    Forced sexual activity: Not on file  Other Topics Concern  . Not on file  Social History Narrative  . Not on file   Past Surgical History:  Procedure Laterality Date  . PERIPHERAL ARTERIAL STENT GRAFT Left 2010   Family History  Problem Relation Age of Onset  . Diabetes Sister   . Cancer Sister        breast  . Alzheimer's disease Mother   . Dementia Mother   . AAA (abdominal aortic aneurysm) Father    No Known Allergies     Assessment & Plan:  Presents as a new patient (last seen in 2015) at the request of Dr. Vidal Schwalbe for evaluation of a AAA.  An aortoiliac arterial duplex performed at [facility] on Mar 17, 2018 was notable for "fusiform aneurysmal dilatation of the infrarenal abdominal aorta with a maximal diameter of 4.0 cm. There is some peripheral wall adherent thrombus as well as atherosclerotic plaque." The patient denies any symptoms such as back pain, pulsatile abdominal masses or thrombosis in his extremities.  The patient does have a past medical history of peripheral artery disease however at this time denies any claudication-like symptoms, rest pain or ulceration to the bilateral lower extremity.  The patient is an active smoker.  Patient's blood pressure today was 110/68.  The patient denies any fever, nausea vomiting.  1. Peripheral vascular disease ([place]) - Stable The patient does have a past medical  history of peripheral artery disease The patient was last seen in our office in 2015 Sounds like Dr. Ouida Sills has been following her peripheral artery disease however she does present today without complaint. The patient denies any claudication-like symptoms, rest pain or ulcer formation to the bilateral lower extremity I will bring the patient back in 6 months and have her undergo an ABI to assess and get a new baseline for her peripheral artery disease I have discussed with the patient at length  the risk factors for and pathogenesis of atherosclerotic disease and encouraged a healthy diet, regular exercise regimen and blood pressure / glucose control.  The patient was encouraged to call the office in the interim if he experiences any claudication like symptoms, rest pain or ulcers to his feet / toes.  - VAS [place] ABI WITH/WO TBI; Future  2. AAA (abdominal aortic aneurysm) without rupture (HCC) - Stable Studies reviewed with patient. Duplex stable, physical exam unremarkable. No surgery or intervention at this time. The patient to follow up in six months with an aortic duplex. The patient has an asymptomatic abdominal aortic aneurysm that is 4cm in maximal diameter.  I have reviewed the natural history of abdominal aortic aneurysm and the small risk of rupture for aneurysm less than 5 cm in size.  However, as these small aneurysms tend to enlarge over time, continued surveillance with ultrasound or CT scan is mandatory.  The patient's blood pressure is being adequately controlled however I have reviewed the importance of hypertension and lipid control and the importance of continuing his abstinence from tobacco.  The patient is also encouraged to exercise a minimum of 30 minutes 4 times a week.  Should the patient develop new onset abdominal or back pain or signs of peripheral embolization they are instructed to seek medical attention immediately and to alert the physician providing care that they have  an aneurysm.  The patient voices their understanding.  - VAS [place] AAA DUPLEX; Future  3. Tobacco abuse - Stable We had a discussion for approximately 5 minutes regarding the absolute need for smoking cessation due to the deleterious nature of tobacco on the vascular system. We discussed the tobacco use would diminish patency of any intervention, and likely significantly worsen progressio of disease. We discussed multiple agents for quitting including replacement therapy or medications to reduce cravings such as Chantix. The patient voices their understanding of the importance of smoking cessation.  4. Hyperglycemia - Stable Encouraged good control as its slows the progression of atherosclerotic disease  5. Dyslipidemia - Stable Encouraged good control as its slows the progression of atherosclerotic disease  Current Outpatient Medications on File Prior to Visit  Medication Sig Dispense Refill  . aspirin EC 81 MG tablet Take 1 tablet (81 mg total) by mouth daily. 90 tablet 0  . atorvastatin (LIPITOR) 40 MG tablet Take 1 tablet (40 mg total) by mouth daily at 6 PM. 90 tablet 0  . fluticasone-salmeterol (ADVAIR HFA) 115-21 MCG/ACT inhaler Inhale 2 puffs into the lungs 2 (two) times daily. Rinse mouth after use. 1 Inhaler 12  . metoprolol succinate (TOPROL-XL) 25 MG 24 hr tablet Take 1 tablet (25 mg total) by mouth daily. 90 tablet 3  . Misc. Devices [facility]) [place] Wheelchair for home use 1 each 0   No current facility-administered medications on file prior to visit.    There are no Patient Instructions on file for this visit. No follow-ups on file.  KIMBERLY A STEGMAYER, PA-C

## 2018-06-09 ENCOUNTER — Ambulatory Visit (INDEPENDENT_AMBULATORY_CARE_PROVIDER_SITE_OTHER): Payer: Medicare Other | Admitting: Gastroenterology

## 2018-06-09 ENCOUNTER — Inpatient Hospital Stay
Admission: AD | Admit: 2018-06-09 | Discharge: 2018-06-11 | DRG: 811 | Disposition: A | Discharge status: Home Health | Payer: Medicare Other | Source: Ambulatory Visit | Attending: Internal Medicine | Admitting: Internal Medicine

## 2018-06-09 ENCOUNTER — Other Ambulatory Visit: Payer: Self-pay

## 2018-06-09 ENCOUNTER — Encounter: Payer: Self-pay | Admitting: Gastroenterology

## 2018-06-09 VITALS — BP 103/64 | HR 119 | Ht 66.0 in | Wt 87.0 lb

## 2018-06-09 DIAGNOSIS — E785 Hyperlipidemia, unspecified: Secondary | ICD-10-CM | POA: Diagnosis present

## 2018-06-09 DIAGNOSIS — Z79899 Other long term (current) drug therapy: Secondary | ICD-10-CM

## 2018-06-09 DIAGNOSIS — Z66 Do not resuscitate: Secondary | ICD-10-CM | POA: Diagnosis present

## 2018-06-09 DIAGNOSIS — I10 Essential (primary) hypertension: Secondary | ICD-10-CM | POA: Diagnosis present

## 2018-06-09 DIAGNOSIS — R634 Abnormal weight loss: Secondary | ICD-10-CM | POA: Diagnosis not present

## 2018-06-09 DIAGNOSIS — E43 Unspecified severe protein-calorie malnutrition: Secondary | ICD-10-CM

## 2018-06-09 DIAGNOSIS — I739 Peripheral vascular disease, unspecified: Secondary | ICD-10-CM | POA: Diagnosis present

## 2018-06-09 DIAGNOSIS — H548 Legal blindness, as defined in USA: Secondary | ICD-10-CM | POA: Diagnosis present

## 2018-06-09 DIAGNOSIS — D509 Iron deficiency anemia, unspecified: Principal | ICD-10-CM | POA: Diagnosis present

## 2018-06-09 DIAGNOSIS — D649 Anemia, unspecified: Secondary | ICD-10-CM | POA: Diagnosis present

## 2018-06-09 DIAGNOSIS — Z8673 Personal history of transient ischemic attack (TIA), and cerebral infarction without residual deficits: Secondary | ICD-10-CM

## 2018-06-09 DIAGNOSIS — I639 Cerebral infarction, unspecified: Secondary | ICD-10-CM | POA: Diagnosis not present

## 2018-06-09 DIAGNOSIS — F17210 Nicotine dependence, cigarettes, uncomplicated: Secondary | ICD-10-CM | POA: Diagnosis present

## 2018-06-09 DIAGNOSIS — D508 Other iron deficiency anemias: Secondary | ICD-10-CM | POA: Diagnosis not present

## 2018-06-09 DIAGNOSIS — R627 Adult failure to thrive: Secondary | ICD-10-CM | POA: Diagnosis present

## 2018-06-09 DIAGNOSIS — Z720 Tobacco use: Secondary | ICD-10-CM | POA: Diagnosis not present

## 2018-06-09 DIAGNOSIS — Z7951 Long term (current) use of inhaled steroids: Secondary | ICD-10-CM

## 2018-06-09 DIAGNOSIS — J449 Chronic obstructive pulmonary disease, unspecified: Secondary | ICD-10-CM | POA: Diagnosis present

## 2018-06-09 DIAGNOSIS — Z681 Body mass index (BMI) 19 or less, adult: Secondary | ICD-10-CM

## 2018-06-09 DIAGNOSIS — Z7982 Long term (current) use of aspirin: Secondary | ICD-10-CM | POA: Diagnosis not present

## 2018-06-09 DIAGNOSIS — Z993 Dependence on wheelchair: Secondary | ICD-10-CM | POA: Diagnosis not present

## 2018-06-09 DIAGNOSIS — Z716 Tobacco abuse counseling: Secondary | ICD-10-CM | POA: Diagnosis not present

## 2018-06-09 LAB — FOLATE: Folate: 9.5 ng/mL (ref >5.9)

## 2018-06-09 LAB — COMPREHENSIVE METABOLIC PANEL
ALK PHOS: 69 U/L (ref 38–126)
ALT: 12 U/L (ref 0–44)
AST: 14 U/L — AB (ref 15–41)
Albumin: 3.6 g/dL (ref 3.5–5.0)
Anion gap: 7 (ref 5–15)
BUN: 11 mg/dL (ref 8–23)
CALCIUM: 9 mg/dL (ref 8.9–10.3)
CO2: 25 mmol/L (ref 22–32)
CREATININE: 0.55 mg/dL (ref 0.44–1.00)
Chloride: 107 mmol/L (ref 98–111)
GFR calc Af Amer: >60 mL/min (ref >60)
Glucose, Bld: 88 mg/dL (ref 70–99)
Potassium: 4.1 mmol/L (ref 3.5–5.1)
SODIUM: 139 mmol/L (ref 135–145)
Total Bilirubin: 0.5 mg/dL (ref 0.3–1.2)
Total Protein: 7.6 g/dL (ref 6.5–8.1)

## 2018-06-09 LAB — CBC
HEMATOCRIT: 28.8 % — AB (ref 35.0–47.0)
HEMOGLOBIN: 9.2 g/dL — AB (ref 12.0–16.0)
MCH: 24.8 pg — ABNORMAL LOW (ref 26.0–34.0)
MCHC: 31.9 g/dL — ABNORMAL LOW (ref 32.0–36.0)
MCV: 77.5 fL — ABNORMAL LOW (ref 80.0–100.0)
PLATELETS: 375 10*3/uL (ref 150–440)
RBC: 3.72 MIL/uL — ABNORMAL LOW (ref 3.80–5.20)
RDW: 20.7 % — ABNORMAL HIGH (ref 11.5–14.5)
WBC: 8.6 10*3/uL (ref 3.6–11.0)

## 2018-06-09 LAB — IRON AND TIBC
Iron: 14 ug/dL — ABNORMAL LOW (ref 28–170)
Saturation Ratios: 3 % — ABNORMAL LOW (ref 10.4–31.8)
TIBC: 446 ug/dL (ref 250–450)
UIBC: 432 ug/dL

## 2018-06-09 MED ORDER — ONDANSETRON HCL 4 MG/2ML IJ SOLN: 4.0000 mg | Freq: Four times a day (QID) | INTRAMUSCULAR | Status: DC | PRN

## 2018-06-09 MED ORDER — PEG 3350-KCL-NA BICARB-NACL 420 G PO SOLR
4000.0000 mL | Freq: Once | ORAL | Status: AC
  Administered 2018-06-09: 4000 mL via ORAL
  Filled 2018-06-09: qty 4000

## 2018-06-09 MED ORDER — POLYETHYLENE GLYCOL 3350 17 G PO PACK: 17.0000 g | PACK | Freq: Every day | ORAL | Status: DC | PRN

## 2018-06-09 MED ORDER — SODIUM CHLORIDE 0.9 % IV SOLN
Freq: Once | INTRAVENOUS | Status: AC
  Administered 2018-06-09: 17:00:00 via INTRAVENOUS

## 2018-06-09 MED ORDER — SENNA 8.6 MG PO TABS
1.0000 | ORAL_TABLET | Freq: Two times a day (BID) | ORAL | Status: DC
  Administered 2018-06-09: 8.6 mg via ORAL
  Filled 2018-06-09 (×2): qty 1

## 2018-06-09 MED ORDER — ONDANSETRON HCL 4 MG PO TABS: 4.0000 mg | ORAL_TABLET | Freq: Four times a day (QID) | ORAL | Status: DC | PRN

## 2018-06-09 MED ORDER — ACETAMINOPHEN 325 MG PO TABS: 650.0000 mg | ORAL_TABLET | Freq: Four times a day (QID) | ORAL | Status: DC | PRN

## 2018-06-09 MED ORDER — ACETAMINOPHEN 650 MG RE SUPP: 650.0000 mg | Freq: Four times a day (QID) | RECTAL | Status: DC | PRN

## 2018-06-09 NOTE — Progress Notes (Signed)
Cephas Darby, MD [address]  [place]  [place], [place] [address]  Main: [phone]  Fax: [phone]    Gastroenterology Consultation  Referring Provider:     Arnetha Courser, MD Primary Care Physician:  Arnetha Courser, MD Primary Gastroenterologist:  Dr. Cephas Darby Reason for Consultation:     Failure to thrive, iron deficiency anemia        HPI:   Kaitlyn Gonzalez is a 82 y.o. female referred by Dr. Arnetha Courser, MD  for consultation & management of and deficiency anemia and unintentional weight loss. Patient is first detected to have microcytic anemia in 06/2017. She was seen by Dr. Sanda Klein in 02/2018 and was found to have declining hemoglobin 8.5, MCV 74.6, thrombocytosis. She was found to have severe iron deficiency with ferritin level 12, elevated TIBC. She had a fall in August 2018, that resulted in nondisplaced fracture of her sacrococcygeal junction. She also has trochanteric bursitis of her right hip, followed by orthopedic at [facility]. Since then, patient has been suffering from significant right hip pain, decreased mobility, has been wheelchair-bound. Her appetite has decreased associated with approximately 30 pound weight loss. She is accompanied by her son today who reports that patient sleeps all day due to pain and she was taking Percocet. She recently ran out of it. Patient denies abdominal pain, nausea, vomiting, melena, rectal bleeding. She has history of chronic tobacco use and continues to smoke cigarettes. She denies being depressed, lives with her children She is not on any medications including oral iron supplements. Patient is legally blind in her right eye since age of 6 She was also evaluated by Dr. Ashby Dawes, pulmonologist for her COPD/emphysema with pulmonary fibrosis based on the CT chest given her history of chronic tobacco use  NSAIDs: none  Antiplts/Anticoagulants/Anti thrombotics: none  GI Procedures: unclear if patient had a  colonoscopy before Never had an EGD in the past  Past Medical History:  Diagnosis Date  . Dyslipidemia   . History of stroke with residual effects    Stroke in April 2016.  Marland Kitchen Hypertension   . Legally blind in right eye, as defined in [place]   . Peripheral vascular disease ([place])   . Stroke [facility])     Past Surgical History:  Procedure Laterality Date  . PERIPHERAL ARTERIAL STENT GRAFT Left 2010    Current Outpatient Medications:  .  Misc. Devices [facility]) MISC, Wheelchair for home use, Disp: 1 each, Rfl: 0 .  aspirin EC 81 MG tablet, Take 1 tablet (81 mg total) by mouth daily. (Patient not taking: Reported on 06/09/2018), Disp: 90 tablet, Rfl: 0 .  atorvastatin (LIPITOR) 40 MG tablet, Take 1 tablet (40 mg total) by mouth daily at 6 PM. (Patient not taking: Reported on 06/09/2018), Disp: 90 tablet, Rfl: 0 .  fluticasone-salmeterol (ADVAIR HFA) 115-21 MCG/ACT inhaler, Inhale 2 puffs into the lungs 2 (two) times daily. Rinse mouth after use. (Patient not taking: Reported on 06/09/2018), Disp: 1 Inhaler, Rfl: 12 .  metoprolol succinate (TOPROL-XL) 25 MG 24 hr tablet, Take 1 tablet (25 mg total) by mouth daily. (Patient not taking: Reported on 06/09/2018), Disp: 90 tablet, Rfl: 3    Family History  Problem Relation Age of Onset  . Diabetes Sister   . Cancer Sister        breast  . Alzheimer's disease Mother   . Dementia Mother   . AAA (abdominal aortic aneurysm) Father      Social  History   Tobacco Use  . Smoking status: Current Every Day Smoker    Packs/day: 0.25    Years: 63.00    Pack years: 15.75    Types: Cigarettes  . Smokeless tobacco: Never Used  . Tobacco comment: Down to about 1/2 pack a day from 2ppd 03/03/2018  Substance Use Topics  . Alcohol use: No    Alcohol/week: 0.0 oz  . Drug use: No    Allergies as of 06/09/2018  . (No Known Allergies)    Review of Systems:    All systems reviewed and negative except where noted in HPI.   Physical Exam:  BP 103/64    Pulse (!) 119   Ht 5\' 6"  (1.676 m)   Wt 87 lb (39.5 kg)   BMI 14.04 kg/m  No LMP recorded. Patient is postmenopausal.  General:   Alert, Appears malnourished, appears cachectic and cooperative in NAD Head:  Normocephalic and atraumatic, bitemporal wasting. Eyes:  Sclera clear, no icterus.   Conjunctiva pale Ears:  Normal auditory acuity. Nose:  No deformity, discharge, or lesions. Mouth:  No deformity or lesions,oropharynx dry mucous membranes. Neck:  Supple; no masses or thyromegaly. Lungs:  Respirations even and unlabored.  Clear throughout to auscultation.   No wheezes, crackles, or rhonchi. No acute distress. Heart:  Regular rate and rhythm; no murmurs, clicks, rubs, or gallops. Abdomen:  Normal bowel sounds. Soft, non-tender and non-distended without masses, hepatosplenomegaly or hernias noted.  No guarding or rebound tenderness.   Rectal: Not performed Msk:  extensive muscle wasting in all her extremities, sitting in wheelchair, limited mobility of her right leg. Pulses:  Normal pulses noted. Extremities:  No clubbing or edema.  No cyanosis. Neurologic:  Alert and oriented x3;  grossly normal neurologically. Skin: dry, Intact without significant lesions or rashes. No jaundice. Lymph Nodes:  No significant cervical adenopathy. Psych:  Alert and cooperative. Normal mood and affect.  Imaging Studies: No recent abdominal imaging  Assessment and Plan:   Kaitlyn Gonzalez is a 82 y.o. African-American female with history of chronic tobacco use, peripheral artery disease, history of stroke, and hypertension seen in consultation for chronic severe iron deficiency anemia and unintentional weight loss. She does not demonstrate signs of active GI bleed  I recommend endoscopic evaluation for iron deficiency anemia, rule out malignancy. Patient's son expressed concern regarding the difficulty of bowel preparation at home given her limited functional status. I discussed with him about  the option of admission to the hospital to expedite workup of anemia and failure to thrive and he is agreeable. Patient is agreeable with the plan  Admit to the hospital, discussed case with Dr. Fritzi Mandes Recommend EGD and colonoscopy Clear liquid diet only Bowel prep tonight Check CBC, iron studies, B12 and folate levels Parenteral iron therapy Abdominal imaging if endoscopic evaluation is unremarkable   Follow up after discharge from the hospital   Cephas Darby, MD

## 2018-06-09 NOTE — Progress Notes (Signed)
Pt arrived to 44 with son at bedside. VSS.

## 2018-06-09 NOTE — Progress Notes (Signed)
Family Meeting Note  Advance Directive:yes  Today a meeting took place with the patient and son in the room  Patient has history of COPD with ongoing tobacco abuse, hypertension, unintentional weight loss of 30 pounds over several months with ongoing anemia which is chronic iron deficiency. No endoscopy workup has been done. Patient came in from Dr. Verlin Grills clinic. She will be scheduled to get EGD and colonoscopy. Code status discussed with patient she wants to be full code. Son in the room.  Time 17 mins  Fritzi Mandes, MD

## 2018-06-09 NOTE — H&P (Signed)
Kaitlyn Gonzalez at [place] NAME: Kaitlyn Gonzalez    MR#:  [id number]  DATE OF BIRTH:  [birth year]  DATE OF ADMISSION:  06/09/2018  PRIMARY CARE PHYSICIAN: Kaitlyn Courser, MD   REQUESTING/REFERRING PHYSICIAN: Dr Kaitlyn Gonzalez  CHIEF COMPLAINT:  unintentional weight loss and ongoing anemia  HISTORY OF PRESENT ILLNESS:  Kaitlyn Gonzalez  is a 82 y.o. female with a known history of stroke, hypertension, peripheral vascular disease, history of anemia with no workup in the remote past comes direct admit from G.I. clinic with unintentional weight loss of 30 pounds over last several months and anemia. Patient had a fall last August resulted in nondisplaced fracture of her Sacral coccyx. She has been suffering from significant hip pain and decreased mobility and has been wheelchair-bound. She is being cared by her children at home. Her appetite is decreased associated with a 30 pound weight loss. She on and off takes Aleve. She also is on a baby aspirin. History of chronic tobacco use and continues to smoke. She is not on any anticoagulants. He did not have any colonoscopy or EGD in the past. She is being admitted for further evaluation of management of her weight loss and anemia   PAST MEDICAL HISTORY:   Past Medical History:  Diagnosis Date  . Dyslipidemia   . History of stroke with residual effects    Stroke in April 2016.  Marland Kitchen Hypertension   . Legally blind in right eye, as defined in [place]   . Peripheral vascular disease ([place])   . Stroke [facility])     PAST SURGICAL HISTOIRY:   Past Surgical History:  Procedure Laterality Date  . PERIPHERAL ARTERIAL STENT GRAFT Left 2010    SOCIAL HISTORY:   Social History   Tobacco Use  . Smoking status: Current Every Day Smoker    Packs/day: 0.25    Years: 63.00    Pack years: 15.75    Types: Cigarettes  . Smokeless tobacco: Never Used  . Tobacco comment: Down to about 1/2 pack a day from 2ppd 03/03/2018   Substance Use Topics  . Alcohol use: No    Alcohol/week: 0.0 oz    FAMILY HISTORY:   Family History  Problem Relation Age of Onset  . Diabetes Sister   . Cancer Sister        breast  . Alzheimer's disease Mother   . Dementia Mother   . AAA (abdominal aortic aneurysm) Father     DRUG ALLERGIES:  No Known Allergies  REVIEW OF SYSTEMS:  Review of Systems  Constitutional: Positive for weight loss. Negative for chills and fever.  HENT: Negative for ear discharge, ear pain and nosebleeds.   Eyes: Negative for blurred vision, pain and discharge.  Respiratory: Negative for sputum production, shortness of breath, wheezing and stridor.   Cardiovascular: Negative for chest pain, palpitations, orthopnea and PND.  Gastrointestinal: Positive for melena. Negative for abdominal pain, diarrhea, nausea and vomiting.  Genitourinary: Negative for frequency and urgency.  Musculoskeletal: Positive for back pain and joint pain.  Neurological: Negative for sensory change, speech change, focal weakness and weakness.  Psychiatric/Behavioral: Negative for depression and hallucinations. The patient is not nervous/anxious.      MEDICATIONS AT HOME:   Prior to Admission medications   Medication Sig Start Date End Date Taking? Authorizing Provider  aspirin EC 81 MG tablet Take 1 tablet (81 mg total) by mouth daily. Patient not taking: Reported on 06/09/2018 03/17/18   Suezanne Cheshire  E, NP  atorvastatin (LIPITOR) 40 MG tablet Take 1 tablet (40 mg total) by mouth daily at 6 PM. Patient not taking: Reported on 06/09/2018 03/17/18   Fredderick Severance, NP  fluticasone-salmeterol (ADVAIR HFA) 115-21 MCG/ACT inhaler Inhale 2 puffs into the lungs 2 (two) times daily. Rinse mouth after use. Patient not taking: Reported on 06/09/2018 03/17/18   Laverle Hobby, MD  metoprolol succinate (TOPROL-XL) 25 MG 24 hr tablet Take 1 tablet (25 mg total) by mouth daily. Patient not taking: Reported on 06/09/2018  03/17/18   Fredderick Severance, NP  Misc. Devices [facility]) [place] Wheelchair for home use 01/20/18   Lada, Satira Anis, MD      VITAL SIGNS:  Blood pressure 132/76, pulse (!) 103, temperature 97.6 F (36.4 C), temperature source Oral, resp. rate 20, weight 38.7 kg (85 lb 4.8 oz), SpO2 98 %.  PHYSICAL EXAMINATION:  GENERAL:  82 y.o.-year-old patient lying in the bed with no acute distress. Thin cachectic EYES: Pupils equal, round, reactive to light and accommodation. No scleral icterus. Extraocular muscles intact. Opaque right cornea HEENT: Head atraumatic, normocephalic. Oropharynx and nasopharynx clear.  NECK:  Supple, no jugular venous distention. No thyroid enlargement, no tenderness.  LUNGS: Normal breath sounds bilaterally, no wheezing, rales,rhonchi or crepitation. No use of accessory muscles of respiration.  CARDIOVASCULAR: S1, S2 normal. No murmurs, rubs, or gallops.  ABDOMEN: Soft, nontender, nondistended. Bowel sounds present. No organomegaly or mass.  EXTREMITIES: No pedal edema, cyanosis, or clubbing.  NEUROLOGIC: Cranial nerves II through XII are intact. Muscle strength 5/5 in all extremities. Sensation intact. Gait not checked.  PSYCHIATRIC: The patient is alert and oriented x 3.  SKIN: No obvious rash, lesion, or ulcer.   LABORATORY PANEL:   CBC No results for input(s): WBC, HGB, HCT, PLT in the last 168 hours. ------------------------------------------------------------------------------------------------------------------  Chemistries  No results for input(s): NA, K, CL, CO2, GLUCOSE, BUN, CREATININE, CALCIUM, MG, AST, ALT, ALKPHOS, BILITOT in the last 168 hours.  Invalid input(s): GFRCGP ------------------------------------------------------------------------------------------------------------------  Cardiac Enzymes No results for input(s): TROPONINI in the last 168  hours. ------------------------------------------------------------------------------------------------------------------  RADIOLOGY:  No results found.  EKG:    IMPRESSION AND PLAN:   Kaitlyn Gonzalez  is a 82 y.o. female with a known history of stroke, hypertension, peripheral vascular disease, history of anemia with no workup in the remote past comes direct admit from G.I. clinic with unintentional weight loss of 30 pounds over last several months and anemia.  1. unintentional weight loss with anemia iron deficiency chronic severe -hemoglobin is nine. -Admit to medical floor -G.I. consultation for EGD and colonoscopy -transfuse as needed -will check B12 folate and iron studies  2. hypertension continue home meds  3. Hyperlipidemia on statins  4. History of CVA holding aspirin until G.I. workup is done  patient is bedbound wheelchair-bound. She lives with her children who help around care management for discharge planning  5. DVT prophylaxis SCD and Ted's     All the records are reviewed and case discussed with ED provider. Management plans discussed with the patient, family and they are in agreement.  CODE STATUS: full  TOTAL TIME TAKING CARE OF THIS PATIENT: *50* minutes.    Fritzi Mandes M.D on 06/09/2018 at 4:58 PM  Between 7am to 6pm - Pager - [phone]  After 6pm go to [email or web address] - password [email or web address] [facility]  [facility]  Office  [phone]  CC: Primary care physician; Kaitlyn Courser, MD

## 2018-06-10 ENCOUNTER — Encounter
Admission: AD | Disposition: A | Discharge status: Home Health | Payer: Self-pay | Source: Ambulatory Visit | Attending: Internal Medicine

## 2018-06-10 ENCOUNTER — Inpatient Hospital Stay: Payer: Medicare Other | Admitting: Anesthesiology

## 2018-06-10 ENCOUNTER — Encounter: Payer: Self-pay | Admitting: Certified Registered Nurse Anesthetist

## 2018-06-10 LAB — VITAMIN B12: Vitamin B-12: 233 pg/mL (ref 180–914)

## 2018-06-10 LAB — FOLATE RBC
FOLATE, HEMOLYSATE: 264.6 ng/mL
Folate, RBC: 922 ng/mL (ref >498)
HEMATOCRIT: 28.7 % — AB (ref 34.0–46.6)

## 2018-06-10 LAB — FERRITIN: FERRITIN: 9 ng/mL — AB (ref 11–307)

## 2018-06-10 SURGERY — EGD (ESOPHAGOGASTRODUODENOSCOPY)
Anesthesia: General

## 2018-06-10 MED ORDER — FLUTICASONE FUROATE-VILANTEROL 200-25 MCG/INH IN AEPB
1.0000 | INHALATION_SPRAY | Freq: Every day | RESPIRATORY_TRACT | Status: DC
  Administered 2018-06-11: 1 via RESPIRATORY_TRACT
  Filled 2018-06-10: qty 28

## 2018-06-10 MED ORDER — ASPIRIN EC 81 MG PO TBEC
81.0000 mg | DELAYED_RELEASE_TABLET | Freq: Every day | ORAL | Status: DC
  Filled 2018-06-10: qty 1

## 2018-06-10 MED ORDER — ADULT MULTIVITAMIN W/MINERALS CH
1.0000 | ORAL_TABLET | Freq: Every day | ORAL | Status: DC
  Filled 2018-06-10: qty 1

## 2018-06-10 MED ORDER — SODIUM CHLORIDE 0.9 % IV SOLN
510.0000 mg | Freq: Once | INTRAVENOUS | Status: AC
  Administered 2018-06-10: 510 mg via INTRAVENOUS
  Filled 2018-06-10: qty 17

## 2018-06-10 MED ORDER — ATORVASTATIN CALCIUM 20 MG PO TABS
40.0000 mg | ORAL_TABLET | Freq: Every day | ORAL | Status: DC
  Administered 2018-06-10: 40 mg via ORAL
  Filled 2018-06-10: qty 2

## 2018-06-10 MED ORDER — METOPROLOL SUCCINATE ER 25 MG PO TB24
25.0000 mg | ORAL_TABLET | Freq: Every day | ORAL | Status: DC
  Administered 2018-06-10: 25 mg via ORAL
  Filled 2018-06-10: qty 1

## 2018-06-10 MED ORDER — BOOST / RESOURCE BREEZE PO LIQD CUSTOM
1.0000 | Freq: Three times a day (TID) | ORAL | Status: DC
  Administered 2018-06-10 – 2018-06-11 (×2): 1 via ORAL

## 2018-06-10 NOTE — Progress Notes (Signed)
Initial Nutrition Assessment  DOCUMENTATION CODES:   Severe malnutrition in context of chronic illness  INTERVENTION:   Per chart, pt has been weight stable around 85-89lbs since 2016; pt has not lost any significant weight recenly.   Recommend ferrous sulfate 327m po BID daily  Boost Breeze po TID, each supplement provides 250 kcal and 9 grams of protein   MVI daily  NUTRITION DIAGNOSIS:   Severe Malnutrition related to chronic illness(COPD) as evidenced by moderate fat depletion, moderate to severe muscle depletions.  GOAL:   Patient will meet greater than or equal to 90% of their needs  MONITOR:   PO intake, Supplement acceptance, Labs, Weight trends, Skin, I & O's  REASON FOR ASSESSMENT:   Malnutrition Screening Tool    ASSESSMENT:   82year old female with history of stroke, peripheral vascular disease and chronic anemia who presents from GI clinic due to weight loss and anemia. Pt is wheelchair bound at baseline secondary to weakness   Met with pt in room today. Pt reports good appetite and oral intake at baseline. Pt is asking for steak and potatoes at time of RD visit today. Pt reports she is hungry and wants to eat. Pt is on clear liquid diet r/t EGD scheduled for tomorrow. Per chart, pt has been weight stable around 85-89lbs since 2016; pt has not lost any significant weight recenly. Pt states "I have never weighed over 110lbs". Pt likes chocolate or vanilla Ensure but is willing to drink Boost Breeze while on a clear liquid diet. Pt with iron deficiency anemia; recommend oral supplementation with ferrous sulfate.      Medications reviewed and include: aspirin, senokot  Labs reviewed: iron 14(L), TIBC 446, ferritin 9(L), folate 9.5- 7/24 B12- 233- 7/24 Hgb 9.2(L), Hct 28.8(L), MCV 77.5(L), MCH 24.8(L), MCHC 31.9(L)  NUTRITION - FOCUSED PHYSICAL EXAM:    Most Recent Value  Orbital Region  Mild depletion  Upper Arm Region  Moderate depletion  Thoracic and  Lumbar Region  Moderate depletion  Buccal Region  Mild depletion  Temple Region  Mild depletion  Clavicle Bone Region  Moderate depletion  Clavicle and Acromion Bone Region  Moderate depletion  Scapular Bone Region  Moderate depletion  Dorsal Hand  Severe depletion  Patellar Region  Severe depletion  Anterior Thigh Region  Severe depletion  Posterior Calf Region  Severe depletion  Edema (RD Assessment)  None  Hair  Reviewed  Eyes  Reviewed  Mouth  Reviewed  Skin  Reviewed  Nails  Reviewed     Diet Order:   Diet Order           Diet clear liquid Room service appropriate? Yes; Fluid consistency: Thin  Diet effective now         EDUCATION NEEDS:   Education needs have been addressed  Skin:  Skin Assessment: Reviewed RN Assessment  Last BM:  pta  Height:   Ht Readings from Last 1 Encounters:  06/09/18 '5\' 6"'  (1.676 m)    Weight:   Wt Readings from Last 1 Encounters:  06/09/18 85 lb 4.8 oz (38.7 kg)    Ideal Body Weight:  59 kg  BMI:  Body mass index is 13.77 kg/m.  Estimated Nutritional Needs:   Kcal:  1200-1400kcal/day   Protein:  58-66g/day   Fluid:  >1L/day   CKoleen DistanceMS, RD, LDN Pager #- [phone]#- [phone] Hours Pager: [phone]

## 2018-06-10 NOTE — Progress Notes (Signed)
[facility] [place] NAME: Kaitlyn Gonzalez    MR#:  [id number]  DATE OF BIRTH:  03-Mar-1936  SUBJECTIVE:   Patient presented to the emergency room with anemia and 30 pound weight loss over several months.  REVIEW OF SYSTEMS:    Review of Systems  Constitutional: Negative for fever, chills positive weight loss HENT: Negative for ear pain, nosebleeds, congestion, facial swelling, rhinorrhea, neck pain, neck stiffness and ear discharge.   Respiratory: Negative for cough, shortness of breath, wheezing  Cardiovascular: Negative for chest pain, palpitations and leg swelling.  Gastrointestinal: Negative for heartburn, abdominal pain, vomiting, diarrhea or consitpation Genitourinary: Negative for dysuria, urgency, frequency, hematuria Musculoskeletal: Negative for back pain or joint pain Neurological: Negative for dizziness, seizures, syncope, focal weakness,  numbness and headaches.  Hematological: Does not bruise/bleed easily.  Psychiatric/Behavioral: Negative for hallucinations, confusion, dysphoric mood    Tolerating Diet:yes      DRUG ALLERGIES:  No Known Allergies  VITALS:  Blood pressure 120/67, pulse 88, temperature 98.7 F (37.1 C), resp. rate 18, height 5\' 6"  (1.676 m), weight 85 lb 4.8 oz (38.7 kg), SpO2 100 %.  PHYSICAL EXAMINATION:  Constitutional: Appears thin and cachectic no distress. HENT: Normocephalic. Marland Kitchen Oropharynx is clear and moist.  Eyes: Conjunctivae and EOM are normal. PERRLA, no scleral icterus.  Neck: Normal ROM. Neck supple. No JVD. No tracheal deviation. CVS: RRR, S1/S2 +, no murmurs, no gallops, no carotid bruit.  Pulmonary: Effort and breath sounds normal, no stridor, rhonchi, wheezes, rales.  Abdominal: Soft. BS +,  no distension, tenderness, rebound or guarding.  Musculoskeletal: Normal range of motion. No edema and no tenderness.  Neuro: Alert. CN 2-12 grossly intact. No focal deficits. Skin: Skin is warm  and dry. No rash noted. Psychiatric: Normal affect     LABORATORY PANEL:   CBC Recent Labs  Lab 06/09/18 1823  WBC 8.6  HGB 9.2*  HCT 28.8*  PLT 375   ------------------------------------------------------------------------------------------------------------------  Chemistries  Recent Labs  Lab 06/09/18 1823  NA 139  K 4.1  CL 107  CO2 25  GLUCOSE 88  BUN 11  CREATININE 0.55  CALCIUM 9.0  AST 14*  ALT 12  ALKPHOS 69  BILITOT 0.5   ------------------------------------------------------------------------------------------------------------------  Cardiac Enzymes No results for input(s): TROPONINI in the last 168 hours. ------------------------------------------------------------------------------------------------------------------  RADIOLOGY:  No results found.   ASSESSMENT AND PLAN:   82 year old female with history of stroke, peripheral vascular disease and chronic anemia who presents from GI clinic due to weight loss and anemia.  1.  Acute on chronic anemia with severe iron deficiency: Patient received IV iron GI consultation for EGD and colonoscopy  2.  Weight loss: GI consultation Consider CT of the abdomen  3.  Protein calorie malnutrition, severe in context of disease:  4.  Hyperlipidemia: Patient may resume atorvastatin  5.  Essential hypertension: Continue metoprolol  6.  History of CVA: Continue aspirin and statin      Management plans discussed with the patient and she is in agreement.  CODE STATUS: full  TOTAL TIME TAKING CARE OF THIS PATIENT: 30 minutes.     POSSIBLE D/C tomorrow, DEPENDING ON CLINICAL CONDITION.   Kaitlyn Gonzalez M.D on 06/10/2018 at 12:48 PM  Between 7am to 6pm - Pager - [phone] After 6pm go to [email or web address] - password [email or web address] [facility]  Office  [phone]  CC: Primary care physician; Arnetha Courser, MD  Note: This dictation was prepared  with Dragon dictation along with  smaller phrase technology. Any transcriptional errors that result from this process are unintentional.

## 2018-06-10 NOTE — Evaluation (Signed)
Physical Therapy Evaluation Patient Details Name: Kaitlyn Gonzalez MRN: [id number] DOB: 03/18/1936 Today's Date: 06/10/2018   History of Present Illness  Kaitlyn Gonzalez  is a 82 y.o. female with a known history of stroke, hypertension, peripheral vascular disease, history of anemia with no workup in the remote past comes direct admit from G.I. clinic with unintentional weight loss of 30 pounds over last several months and anemia.  Clinical Impression  Pt tolerated therapy well. Pt denied any pain on PT arrival, and was alert and agreeable to therapy. Pt AROM in UE and LE were all WFL. Pt performed bed mobility mod ind, and transfers w/RW min assist. Pt ambulated 40 ft w/RW min assist from therapist (gait details below). Pt ambulation was primarily limited due decreased actvtity tolerance and fatigue. Pt reports no SOB, light headedness or dizziness with any activities. However pt did report feeling "tired" from ambulation. Pt denies any pain during therapy but reports some soreness in hip. Pt's overall balance was appropriate for safe ambulation w/RW and transfers min assist. Pt reports she uses WC outside of home but cane inside home. Pt agreed she felt more stable with RW, and that upon d/c would benefit from one for increased stability in home environment. Would benefit from skilled PT to address above deficits and promote optimal return to PLOF. Given pt's strong family support (see home details below) and pt's current mobility levels, recommend transition to [place] upon discharge from acute hospitalization.  Follow Up Recommendations Home health PT    Equipment Recommendations  Rolling walker with 5" wheels    Recommendations for Other Services       Precautions / Restrictions Precautions Precautions: Fall Restrictions Weight Bearing Restrictions: No      Mobility  Bed Mobility Overal bed mobility: Modified Independent             General bed mobility comments: Pt goes supine  to sit w/ hand rails and increased effort/time  Transfers Overall transfer level: Needs assistance Equipment used: Rolling walker (2 wheeled) Transfers: Sit to/from Stand Sit to Stand: Min assist         General transfer comment: Pt needs slight assist at posterior hip to complete sit to stand on first attmept, subsequent attempt was CGA  Ambulation/Gait Ambulation/Gait assistance: Min assist Gait Distance (Feet): 40 Feet Assistive device: Rolling walker (2 wheeled) Gait Pattern/deviations: Decreased step length - right;Decreased weight shift to left Gait velocity: decreased   General Gait Details: Pt tends to shift weight to RLE and has difficulty advancing LLE in swing phase.   Stairs            Wheelchair Mobility    Modified Rankin (Stroke Patients Only)       Balance Overall balance assessment: Needs assistance Sitting-balance support: No upper extremity supported;Feet supported Sitting balance-Leahy Scale: Good     Standing balance support: No upper extremity supported Standing balance-Leahy Scale: Good Standing balance comment: Pt maintains balance with light perturbation in all directions     Tandem Stance - Right Leg: 1 Tandem Stance - Left Leg: 2 [place] - Eyes Opened: 14                   Pertinent Vitals/Pain Pain Assessment: No/denies pain    Home Living Family/patient expects to be discharged to:: Private residence Living Arrangements: Children Available Help at Discharge: Family Type of Home: House Home Access: Stairs to enter Entrance Stairs-Rails: Can reach both Entrance Stairs-Number of Steps: 6 Home Layout: One  level Home Equipment: Cane - single point      Prior Function Level of Independence: Needs assistance   Gait / Transfers Assistance Needed: Pt ues wheelchair out of home, and cane within home for ambulation. Pt reports family helps her whenever she needs it, and can assist her in stair navigation and getting in and  out of shower.  ADL's / Homemaking Assistance Needed: Pt reports family does all cooking, cleaning, and housework.         Hand Dominance        Extremity/Trunk Assessment   Upper Extremity Assessment Upper Extremity Assessment: Overall WFL for tasks assessed    Lower Extremity Assessment Lower Extremity Assessment: Generalized weakness(Pt AROM BLE all 3/5 or better)    Cervical / Trunk Assessment Cervical / Trunk Assessment: Normal  Communication   Communication: No difficulties(Legally blind R eye)  Cognition Arousal/Alertness: Awake/alert Behavior During Therapy: WFL for tasks assessed/performed Overall Cognitive Status: Within Functional Limits for tasks assessed                                        General Comments      Exercises Other Exercises Other Exercises: AROM UE: shoulder flex/ext. elbow flex/ext LE: hip flex/ext, knee flex/ext, ankle flex/ext Other Exercises: Bed mobility: Supine to/from sit mod ind x 2; Transfers: Sit to/from stand w/RW min assist x2; Ambulation: 40 ft w/RW min assist    Assessment/Plan    PT Assessment Patient needs continued PT services  PT Problem List Decreased strength;Decreased mobility;Decreased range of motion;Decreased activity tolerance;Decreased balance;Decreased coordination       PT Treatment Interventions DME instruction;Gait training;Stair training;Functional mobility training;Neuromuscular re-education;Balance training;Therapeutic exercise;Therapeutic activities;Patient/family education;Wheelchair mobility training    PT Goals (Current goals can be found in the Care Plan section)  Acute Rehab PT Goals Patient Stated Goal: To go home PT Goal Formulation: With patient Time For Goal Achievement: 06/17/18 Potential to Achieve Goals: Good    Frequency Min 2X/week   Barriers to discharge        Co-evaluation               AM-PAC PT "6 Clicks" Daily Activity  Outcome Measure Difficulty  turning over in bed (including adjusting bedclothes, sheets and blankets)?: A Lot Difficulty moving from lying on back to sitting on the side of the bed? : A Lot Difficulty sitting down on and standing up from a chair with arms (e.g., wheelchair, bedside commode, etc,.)?: Unable Help needed moving to and from a bed to chair (including a wheelchair)?: A Little Help needed walking in hospital room?: A Little Help needed climbing 3-5 steps with a railing? : A Lot 6 Click Score: 13    End of Session Equipment Utilized During Treatment: Gait belt Activity Tolerance: Patient tolerated treatment well Patient left: in bed;with bed alarm set;with call bell/phone within reach;Other (comment)(Physician in room) Nurse Communication: Mobility status PT Visit Diagnosis: Unsteadiness on feet (R26.81);Other abnormalities of gait and mobility (R26.89);History of falling (Z91.81);Muscle weakness (generalized) (M62.81)    Time: 2993-7169 PT Time Calculation (min) (ACUTE ONLY): 27 min   Charges:              Hortencia Conradi, SPT 06/10/18,11:57 AM

## 2018-06-10 NOTE — Progress Notes (Addendum)
Patient's GI procedure postponed until 06/11/18 because patient did not complete go-lytley. Nurse continues to encourage patient to drink go-lytely and explain why she needs to. Patient is drinking, but is very slow to complete. Nurse came in to give patient tap- water enema earlier and patient's underwear and pants were soiled with bowel, per patient " throw them away I will tell my daughter to bring me more clothing. Per patient's request underwear and pants discarded in trash can.

## 2018-06-11 ENCOUNTER — Telehealth: Payer: Self-pay

## 2018-06-11 ENCOUNTER — Encounter
Admission: AD | Disposition: A | Discharge status: Home Health | Payer: Self-pay | Source: Ambulatory Visit | Attending: Internal Medicine

## 2018-06-11 DIAGNOSIS — E43 Unspecified severe protein-calorie malnutrition: Secondary | ICD-10-CM

## 2018-06-11 LAB — CBC
HCT: 25.6 % — ABNORMAL LOW (ref 35.0–47.0)
Hemoglobin: 8.3 g/dL — ABNORMAL LOW (ref 12.0–16.0)
MCH: 24.9 pg — ABNORMAL LOW (ref 26.0–34.0)
MCHC: 32.3 g/dL (ref 32.0–36.0)
MCV: 77.2 fL — ABNORMAL LOW (ref 80.0–100.0)
PLATELETS: 335 10*3/uL (ref 150–440)
RBC: 3.32 MIL/uL — AB (ref 3.80–5.20)
RDW: 21 % — ABNORMAL HIGH (ref 11.5–14.5)
WBC: 7.4 10*3/uL (ref 3.6–11.0)

## 2018-06-11 SURGERY — ESOPHAGOGASTRODUODENOSCOPY (EGD) WITH PROPOFOL
Anesthesia: General

## 2018-06-11 MED ORDER — PANTOPRAZOLE SODIUM 40 MG PO TBEC: 40.0000 mg | DELAYED_RELEASE_TABLET | Freq: Every day | ORAL | 0 refills | Status: DC

## 2018-06-11 MED ORDER — ENSURE COMPLETE PO LIQD: 237.0000 mL | Freq: Two times a day (BID) | ORAL | 1 refills | Status: AC

## 2018-06-11 NOTE — Telephone Encounter (Signed)
Spoke with her son and he scheduled an appt for 06-17-18 and will have his aunt to call back to confirm because she will be the one bringing her.

## 2018-06-11 NOTE — Care Management Important Message (Signed)
Copy of signed IM left with patient in room.  

## 2018-06-11 NOTE — Progress Notes (Signed)
[facility] [place] at [place] NAME: Kaitlyn Gonzalez    MR#:  [id number]  DATE OF BIRTH:  10-30-1936  SUBJECTIVE:   Patient did not finish prep going for EGD and c/s this am REVIEW OF SYSTEMS:    Review of Systems  Constitutional: Negative for fever, chills positive weight loss HENT: Negative for ear pain, nosebleeds, congestion, facial swelling, rhinorrhea, neck pain, neck stiffness and ear discharge.   Respiratory: Negative for cough, shortness of breath, wheezing  Cardiovascular: Negative for chest pain, palpitations and leg swelling.  Gastrointestinal: Negative for heartburn, abdominal pain, vomiting, diarrhea or consitpation Genitourinary: Negative for dysuria, urgency, frequency, hematuria Musculoskeletal: Negative for back pain or joint pain Neurological: Negative for dizziness, seizures, syncope, focal weakness,  numbness and headaches.  Hematological: Does not bruise/bleed easily.  Psychiatric/Behavioral: Negative for hallucinations, ++some mildconfusion, no dysphoric mood    Tolerating Diet: npo     DRUG ALLERGIES:  No Known Allergies  VITALS:  Blood pressure 135/71, pulse 75, temperature 98.2 F (36.8 C), temperature source Oral, resp. rate 18, height 5\' 6"  (1.676 m), weight 85 lb 4.8 oz (38.7 kg), SpO2 100 %.  PHYSICAL EXAMINATION:  Constitutional: Appears thin and cachectic no distress. HENT: Normocephalic. Marland Kitchen Oropharynx is clear and moist.  Eyes: Conjunctivae and EOM are normal. PERRLA, no scleral icterus.  Neck: Normal ROM. Neck supple. No JVD. No tracheal deviation. CVS: RRR, S1/S2 +, no murmurs, no gallops, no carotid bruit.  Pulmonary: Effort and breath sounds normal, no stridor, rhonchi, wheezes, rales.  Abdominal: Soft. BS +,  no distension, tenderness, rebound or guarding.  Musculoskeletal: Normal range of motion. No edema and no tenderness.  Neuro: Alert. CN 2-12 grossly intact. No focal deficits. Oriented x2 Skin: Skin  is warm and dry. No rash noted. Psychiatric: Normal affect     LABORATORY PANEL:   CBC Recent Labs  Lab 06/11/18 0656  WBC 7.4  HGB 8.3*  HCT 25.6*  PLT 335   ------------------------------------------------------------------------------------------------------------------  Chemistries  Recent Labs  Lab 06/09/18 1823  NA 139  K 4.1  CL 107  CO2 25  GLUCOSE 88  BUN 11  CREATININE 0.55  CALCIUM 9.0  AST 14*  ALT 12  ALKPHOS 69  BILITOT 0.5   ------------------------------------------------------------------------------------------------------------------  Cardiac Enzymes No results for input(s): TROPONINI in the last 168 hours. ------------------------------------------------------------------------------------------------------------------  RADIOLOGY:  No results found.   ASSESSMENT AND PLAN:   82 year old female with history of stroke, peripheral vascular disease and chronic anemia who presents from GI clinic due to weight loss and anemia.  1.  Acute on chronic anemia with severe iron deficiency: Patient received IV iron For EGD and colonoscopy today hgb stable  2.  Weight loss: GI consultation Consider CT of the abdomen outpatient  3.  Protein calorie malnutrition, severe in context of disease:  4.  Hyperlipidemia: Patient may resume atorvastatin  5.  Essential hypertension: Continue metoprolol  6.  History of CVA: Continue aspirin and statin  HHPT at discharge Orders written     Management plans discussed with the patient and she is in agreement.  CODE STATUS: full  TOTAL TIME TAKING CARE OF THIS PATIENT: 24 minutes.     POSSIBLE D/C today after EGD DEPENDING ON results  [name] M.D on 06/11/2018 at 11:03 AM  Between 7am to 6pm - Pager - [phone] After 6pm go to [email or web address] - Proofreader  [facility]  Office  [phone]  CC: Primary care physician; Sanda Klein,  Satira Anis, MD  Note: This dictation  was prepared with Dragon dictation along with smaller phrase technology. Any transcriptional errors that result from this process are unintentional.

## 2018-06-11 NOTE — Telephone Encounter (Signed)
Copied from [place] [phone] [id number]. Topic: Appointment Scheduling - Scheduling Inquiry for Clinic >> Jun 11, 2018 12:05 PM Burchel, Abbi R wrote: Pt needs hosp f/up 3 days from d/c from [facility] on 06/11/18.  Dr Sanda Klein does not have any hosp f/up appts in that time frame.  Please call pt to schedule.  Pt: [phone]

## 2018-06-11 NOTE — Care Management Note (Signed)
Case Management Note  Patient Details  Name: Kaitlyn Gonzalez MRN: [id number] Date of Birth: 1936/04/01   Patient admitted with anemia and failure to thrive.  Patient lives at home with her 2 sons, and daughter.  Her other son lives in [place].  He provides transportation to her appointments.  PCP [facility].  Patient has a RW and WC in the home.  PT has assessed patient and recommends home health PT.  Patient and son are agreeable to home health services.  Son states that they are either open or recently closed with [facility] home health, and would like to use them again.  Referral made to [place] with [facility].  RNCM signing off.   Subjective/Objective:                    Action/Plan:   Expected Discharge Date:  06/11/18               Expected Discharge Plan:  [place]  In-House Referral:     Discharge planning Services  CM Consult  Post Acute Care Choice:  Home Health Choice offered to:  Patient, Adult Children  DME Arranged:    DME Agency:     HH Arranged:  RN, PT, Nurse's Aide, Social Work [employer] Agency:  [facility]  Status of Service:  Completed, signed off  If discussed at [employer], dates discussed:    Additional Comments:  Beverly Sessions, RN 06/11/2018, 1:35 PM

## 2018-06-11 NOTE — Progress Notes (Signed)
I spoke with patients son about patient's request not to do invasive workup at this time.  and he will pick up patient later after work.

## 2018-06-11 NOTE — Discharge Summary (Signed)
[facility] NAME: Kaitlyn Gonzalez    MR#:  [id number]  DATE OF BIRTH:  12-Nov-1936  DATE OF ADMISSION:  06/09/2018 ADMITTING PHYSICIAN: Fritzi Mandes, MD  DATE OF DISCHARGE: 06/11/2018  PRIMARY CARE PHYSICIAN: Arnetha Courser, MD    ADMISSION DIAGNOSIS:  Failure to thrive  DISCHARGE DIAGNOSIS:  Active Problems:   Anemia   Protein-calorie malnutrition, severe   SECONDARY DIAGNOSIS:   Past Medical History:  Diagnosis Date  . Dyslipidemia   . History of stroke with residual effects    Stroke in April 2016.  Marland Kitchen Hypertension   . Legally blind in right eye, as defined in [place]   . Peripheral vascular disease ([place])   . Stroke [facility])     HOSPITAL COURSE:   82 year old female with history of stroke, peripheral vascular disease and chronic anemia who presents from GI clinic due to weight loss and anemia.  1.  Acute on chronic anemia with severe iron deficiency: Patient received IV iron Was for EGD and colonoscopy.  I discussed with the patient and length that she needs an EGD and colonoscopy to evaluate her anemia.  At this point patient does not want EGD or colonoscopy.  She does not at this time want to find out if she does or does not have a colon cancer or the cause of her weight loss and anemia.  She does however want to follow-up with GI as an outpatient or primary care physician at a later date.   2.  Weight loss: As outlined above  3.  Protein calorie malnutrition, severe in context of disease:  4.  Hyperlipidemia: Patient may resume atorvastatin  5.  Essential hypertension: Continue metoprolol  6.  History of CVA: Continue aspirin and statin      DISCHARGE CONDITIONS AND DIET:   Stable condition on regular diet  CONSULTS OBTAINED:  Treatment Team:  Lucilla Lame, MD  DRUG ALLERGIES:  No Known Allergies  DISCHARGE MEDICATIONS:   Allergies as of 06/11/2018   No Known Allergies     Medication List     STOP taking these medications   aspirin EC 81 MG tablet     TAKE these medications   atorvastatin 40 MG tablet Commonly known as:  LIPITOR Take 1 tablet (40 mg total) by mouth daily at 6 PM.   feeding supplement (ENSURE COMPLETE) Liqd Take 237 mLs by mouth 2 (two) times daily between meals.   fluticasone-salmeterol 115-21 MCG/ACT inhaler Commonly known as:  ADVAIR HFA Inhale 2 puffs into the lungs 2 (two) times daily. Rinse mouth after use.   metoprolol succinate 25 MG 24 hr tablet Commonly known as:  TOPROL-XL Take 1 tablet (25 mg total) by mouth daily.   pantoprazole 40 MG tablet Commonly known as:  PROTONIX Take 1 tablet (40 mg total) by mouth daily.   Administrator, arts for home use            [employer]  (From admission, onward)        Start     Ordered   06/11/18 1107  For home use only DME Walker rolling  Once    Question:  Patient needs a walker to treat with the following condition  Answer:  Weakness   06/11/18 1108        Today   CHIEF COMPLAINT:   Patient refuses EGD and colonoscopy She did not finish prep She wants to go home   VITAL  SIGNS:  Blood pressure 135/71, pulse 75, temperature 98.2 F (36.8 C), temperature source Oral, resp. rate 18, height 5\' 6"  (1.676 m), weight 85 lb 4.8 oz (38.7 kg), SpO2 100 %.   REVIEW OF SYSTEMS:  Review of Systems  Constitutional: Negative.  Negative for chills, fever and malaise/fatigue.  HENT: Negative.  Negative for ear discharge, ear pain, hearing loss, nosebleeds and sore throat.   Eyes: Negative.  Negative for blurred vision and pain.  Respiratory: Negative.  Negative for cough, hemoptysis, shortness of breath and wheezing.   Cardiovascular: Negative.  Negative for chest pain, palpitations and leg swelling.  Gastrointestinal: Negative.  Negative for abdominal pain, blood in stool, diarrhea, nausea and vomiting.  Genitourinary: Negative.  Negative for dysuria.   Musculoskeletal: Negative.  Negative for back pain.  Skin: Negative.   Neurological: Negative for dizziness, tremors, speech change, focal weakness, seizures and headaches.  Endo/Heme/Allergies: Negative.  Does not bruise/bleed easily.  Psychiatric/Behavioral: Negative.  Negative for depression, hallucinations and suicidal ideas.     PHYSICAL EXAMINATION:  GENERAL:  82 y.o.-year-old patient lying in the bed with no acute distress.  Thin and frail NECK:  Supple, no jugular venous distention. No thyroid enlargement, no tenderness.  LUNGS: Normal breath sounds bilaterally, no wheezing, rales,rhonchi  No use of accessory muscles of respiration.  CARDIOVASCULAR: S1, S2 normal. No murmurs, rubs, or gallops.  ABDOMEN: Soft, non-tender, non-distended. Bowel sounds present. No organomegaly or mass.  EXTREMITIES: No pedal edema, cyanosis, or clubbing.  PSYCHIATRIC: The patient is alert and oriented x 3.  SKIN: No obvious rash, lesion, or ulcer.   DATA REVIEW:   CBC Recent Labs  Lab 06/11/18 0656  WBC 7.4  HGB 8.3*  HCT 25.6*  PLT 335    Chemistries  Recent Labs  Lab 06/09/18 1823  NA 139  K 4.1  CL 107  CO2 25  GLUCOSE 88  BUN 11  CREATININE 0.55  CALCIUM 9.0  AST 14*  ALT 12  ALKPHOS 69  BILITOT 0.5    Cardiac Enzymes No results for input(s): TROPONINI in the last 168 hours.  Microbiology Results  @MICRORSLT48 @  RADIOLOGY:  No results found.    Allergies as of 06/11/2018   No Known Allergies     Medication List    STOP taking these medications   aspirin EC 81 MG tablet     TAKE these medications   atorvastatin 40 MG tablet Commonly known as:  LIPITOR Take 1 tablet (40 mg total) by mouth daily at 6 PM.   feeding supplement (ENSURE COMPLETE) Liqd Take 237 mLs by mouth 2 (two) times daily between meals.   fluticasone-salmeterol 115-21 MCG/ACT inhaler Commonly known as:  ADVAIR HFA Inhale 2 puffs into the lungs 2 (two) times daily. Rinse mouth after  use.   metoprolol succinate 25 MG 24 hr tablet Commonly known as:  TOPROL-XL Take 1 tablet (25 mg total) by mouth daily.   pantoprazole 40 MG tablet Commonly known as:  PROTONIX Take 1 tablet (40 mg total) by mouth daily.   Administrator, arts for home use            [employer]  (From admission, onward)        Start     Ordered   06/11/18 1107  For home use only DME Walker rolling  Once    Question:  Patient needs a walker to treat with the following condition  Answer:  Weakness   06/11/18 1108  Management plans discussed with the patient and she is in agreement. Stable for discharge home  Patient should follow up with pcp  CODE STATUS:     Code Status Orders  (From admission, onward)        Start     Ordered   06/10/18 1252  Full code  Continuous     06/10/18 1251    Code Status History    Date Active Date Inactive Code Status Order ID Comments User Context   06/09/2018 1641 06/10/2018 1251 Full Code [id number]  Fritzi Mandes, MD Inpatient      TOTAL TIME TAKING CARE OF THIS PATIENT: 38 minutes.    Note: This dictation was prepared with Dragon dictation along with smaller phrase technology. Any transcriptional errors that result from this process are unintentional.  Titan Karner M.D on 06/11/2018 at 11:56 AM  Between 7am to 6pm - Pager - [phone] After 6pm go to [email or web address] - password [email or web address] [facility]  Office  [phone]  CC: Primary care physician; Arnetha Courser, MD

## 2018-06-11 NOTE — Progress Notes (Signed)
Family Meeting Note  Advance Directive:no  Today a meeting took place with the Patient.   The following clinical team members were present during this meeting:MD  The following were discussed:Patient's diagnosis: anemia Weight loss , Patient's progosis: Unable to determine and Goals for treatment: DNR  Additional follow-up to be provided: DNR order placed and in chart She does not want to create advanced directives  Time spent during discussion:16 minutes  Kaitlyn Espin, MD

## 2018-06-14 ENCOUNTER — Telehealth: Payer: Self-pay

## 2018-06-14 NOTE — Telephone Encounter (Signed)
TOC #1. Called pt to f/u after d/c from [facility] on 06/11/18. Also wanted to confirm her hosp f/u appt w/ Dr. Sanda Klein on 06/17/18 @ 2:40pm. Discharge planning includes the following:  - start ensure and pantoprazole - discontinue use of ASA -Continue with regular diet - f/u with PCP and GI LVM requesting returned call.

## 2018-06-15 ENCOUNTER — Telehealth: Payer: Self-pay

## 2018-06-15 ENCOUNTER — Ambulatory Visit (INDEPENDENT_AMBULATORY_CARE_PROVIDER_SITE_OTHER): Payer: Medicare Other

## 2018-06-15 VITALS — BP 128/62 | HR 85 | Temp 98.2°F | Resp 12 | Ht 66.0 in | Wt 84.1 lb

## 2018-06-15 DIAGNOSIS — R627 Adult failure to thrive: Secondary | ICD-10-CM

## 2018-06-15 DIAGNOSIS — Z598 Other problems related to housing and economic circumstances: Secondary | ICD-10-CM

## 2018-06-15 DIAGNOSIS — Z599 Problem related to housing and economic circumstances, unspecified: Secondary | ICD-10-CM

## 2018-06-15 DIAGNOSIS — Z0000 Encounter for general adult medical examination without abnormal findings: Secondary | ICD-10-CM

## 2018-06-15 DIAGNOSIS — Z7409 Other reduced mobility: Secondary | ICD-10-CM

## 2018-06-15 NOTE — Telephone Encounter (Signed)
Transition Care Management Follow-up Telephone Call  Date of discharge and from where: 06/11/18 from [facility]  How have you been since you were released from the hospital? States she feels okay and has very little to no energy. Asked if she had been taking her Ensure and states she is only taking Ensure when she thinks about it. Also states she does not take her medications. Education provided but states she does not intend to continue with her medication regimen. Further discussion had with Dr. Sanda Klein to discuss possibility for palliative care.   Any questions or concerns? No   Items Reviewed:  Did the pt receive and understand the discharge instructions provided? Yes   Medications obtained and verified? Yes   Any new allergies since your discharge? Yes   Dietary orders reviewed? Yes  Do you have support at home? Yes   Other (ie: DME, Home Health, etc) N/A  Functional Questionnaire: (I = Independent and D = Dependent) ADL's: D  Bathing/Dressing- D   Meal Prep- D  Eating- I  Maintaining continence- D  Transferring/Ambulation- D  Managing Meds- D   Follow up appointments reviewed:    PCP Hospital f/u appt confirmed? Yes  Scheduled to see Dr. Sanda Klein on 06/17/18 @ 2:40pm.  [facility] f/u appt confirmed? N/A   Are transportation arrangements needed? No   If their condition worsens, is the pt aware to call  their PCP or go to the ED? Yes  Was the patient provided with contact information for the PCP's office or ED? Yes  Was the pt encouraged to call back with questions or concerns? Yes

## 2018-06-15 NOTE — Progress Notes (Addendum)
Subjective:   Kaitlyn Gonzalez is a 82 y.o. female who presents for Medicare Annual (Subsequent) preventive examination.  Review of Systems:   N/A Cardiac Risk Factors include: advanced age (>32men, >70 women);dyslipidemia;hypertension;sedentary lifestyle;smoking/ tobacco exposure     Objective:     Vitals: BP 128/62 (BP Location: Right Arm, Patient Position: Sitting, Cuff Size: Normal)   Pulse 85   Temp 98.2 F (36.8 C) (Oral)   Resp 12   Ht 5\' 6"  (1.676 m)   Wt 84 lb 1.6 oz (38.1 kg)   SpO2 90%   BMI 13.57 kg/m   Body mass index is 13.57 kg/m.  Advanced Directives 06/15/2018 06/09/2018 09/09/2017 06/22/2017 04/20/2017 04/20/2017 04/20/2017  Does Patient Have a Medical Advance Directive? Yes No No No No No No  Type of Paramedic of [place];Living will - - - - - -  Does patient want to make changes to medical advance directive? Yes (MAU/Ambulatory/Procedural Areas - Information given) - - - - - -  Copy of [place] in Chart? No - copy requested - - - - - -  Would patient like information on creating a medical advance directive? - No - Patient declined - No - Patient declined Yes (ED - Information included in AVS) Yes (ED - Information included in AVS) -    Tobacco Social History   Tobacco Use  Smoking Status Current Every Day Smoker  . Packs/day: 0.25  . Years: 63.00  . Pack years: 15.75  . Types: Cigarettes  Smokeless Tobacco Never Used  Tobacco Comment   Down to about 1/2 pack a day from 2ppd 03/03/2018     Ready to quit: No Counseling given: Yes Comment: Down to about 1/2 pack a day from 2ppd 03/03/2018  Clinical Intake:  Pre-visit preparation completed: Yes  Pain : 0-10 Pain Score: 4  Pain Type: Chronic pain Pain Location: Hip Pain Orientation: Left Pain Descriptors / Indicators: Constant Pain Onset: More than a month ago Pain Frequency: Constant   BMI - recorded: 15.83 Nutritional Status: BMI <19   Underweight Nutritional Risks: Unintentional weight loss(Taking Ensure but only when she thinks about it) Diabetes: No   Pt was d/c'd from [facility] on 06/11/18 with orders to begin Ensure. Pt states she only drinks the shakes when she thinks about it. Further states cost is an issue. C3 referral placed to assist pt with obtaining and affording Ensure. Spoke with Dr. Sanda Klein re: concerns for malnutrition. Possible pt may benefit from Palliative Care. Dr. Sanda Klein to address during hosp f/u on 06/17/18.  How often do you need to have someone help you when you read instructions, pamphlets, or other written materials from your doctor or pharmacy?: 1 - Never  Interpreter Needed?: No  Information entered by :: Idell Pickles, LPN  Past Medical History:  Diagnosis Date  . Dyslipidemia   . History of stroke with residual effects    Stroke in April 2016.  Marland Kitchen Hypertension   . Legally blind in right eye, as defined in [place]   . Peripheral vascular disease ([place])   . Stroke [facility])    Past Surgical History:  Procedure Laterality Date  . PERIPHERAL ARTERIAL STENT GRAFT Left 2010   Family History  Problem Relation Age of Onset  . Diabetes Sister   . Cancer Sister        breast  . Alzheimer's disease Mother   . Dementia Mother   . AAA (abdominal aortic aneurysm) Father   . Stroke  Brother   . Hyperlipidemia Sister   . Hypertension Sister   . Stroke Brother   . Hypertension Brother    Social History   Socioeconomic History  . Marital status: Divorced    Spouse name: Not on file  . Number of children: 5  . Years of education: Not on file  . Highest education level: 12th grade  Occupational History  . Occupation: Retired  Scientific laboratory technician  . Financial resource strain: Somewhat hard  . Food insecurity:    Worry: Sometimes true    Inability: Sometimes true  . Transportation needs:    Medical: No    Non-medical: No  Tobacco Use  . Smoking status: Current Every Day Smoker    Packs/day: 0.25    Years: 63.00     Pack years: 15.75    Types: Cigarettes  . Smokeless tobacco: Never Used  . Tobacco comment: Down to about 1/2 pack a day from 2ppd 03/03/2018  Substance and Sexual Activity  . Alcohol use: No    Alcohol/week: 0.0 oz  . Drug use: No  . Sexual activity: Not Currently  Lifestyle  . Physical activity:    Days per week: 0 days    Minutes per session: 0 min  . Stress: Not at all  Relationships  . Social connections:    Talks on phone: Patient refused    Gets together: Patient refused    Attends religious service: Patient refused    Active member of club or organization: Patient refused    Attends meetings of clubs or organizations: Patient refused    Relationship status: Divorced  Other Topics Concern  . Not on file  Social History Narrative  . Not on file    Outpatient Encounter Medications as of 06/15/2018  Medication Sig  . feeding supplement, ENSURE COMPLETE, (ENSURE COMPLETE) LIQD Take 237 mLs by mouth 2 (two) times daily between meals.  . Misc. Devices [facility] [place] Wheelchair for home use  . atorvastatin (LIPITOR) 40 MG tablet Take 1 tablet (40 mg total) by mouth daily at 6 PM. (Patient not taking: Reported on 06/09/2018)  . fluticasone-salmeterol (ADVAIR HFA) 115-21 MCG/ACT inhaler Inhale 2 puffs into the lungs 2 (two) times daily. Rinse mouth after use. (Patient not taking: Reported on 06/09/2018)  . metoprolol succinate (TOPROL-XL) 25 MG 24 hr tablet Take 1 tablet (25 mg total) by mouth daily. (Patient not taking: Reported on 06/09/2018)  . pantoprazole (PROTONIX) 40 MG tablet Take 1 tablet (40 mg total) by mouth daily. (Patient not taking: Reported on 06/15/2018)   No facility-administered encounter medications on file as of 06/15/2018.     Activities of Daily Living In your present state of health, do you have any difficulty performing the following activities: 06/15/2018 06/09/2018  Hearing? N -  Comment denies hearing aids -  Vision? N -  Comment wears eyeglasses;  blind L eye; cataract L eye -  Difficulty concentrating or making decisions? Y -  Comment "forgetful" -  Walking or climbing stairs? Y -  Comment hip pain -  Dressing or bathing? Y -  Comment family assists -  Doing errands, shopping? Y N  Comment family transports -  Conservation officer, nature and eating ? N -  Comment full set upper and lower dentures -  Using the Toilet? Y -  Comment family assists -  In the past six months, have you accidently leaked urine? N -  Do you have problems with loss of bowel control? N -  Managing your  Medications? Y -  Comment family manages -  Managing your Finances? Y -  Comment family manages -  Housekeeping or managing your Housekeeping? Y -  Comment family manages -  Some recent data might be hidden    Patient Care Team: Lada, Satira Anis, MD as PCP - General (Family Medicine) Lorelee Cover., MD as Consulting Physician (Ophthalmology) Reche Dixon, PA-C as Consulting Physician (Orthopedic Surgery) Lin Landsman, MD as Consulting Physician (Gastroenterology) Delana Meyer, Dolores Lory, MD as Consulting Physician (Vascular Surgery)    Assessment:   This is a routine wellness examination for Kaitlyn Gonzalez.  Exercise Activities and Dietary recommendations Current Exercise Habits: The patient does not participate in regular exercise at present, Exercise limited by: orthopedic condition(s);Other - see comments(hip pain; failure to thrive)  Goals    . DIET - INCREASE PROTEIN INTAKE     Recommend to drink Ensure at least 2-3 times per day to ensure adequate nutrient intake    . DIET - INCREASE WATER INTAKE     Recommend to drink at least 6-8 8oz glasses of water per day.       Fall Risk Fall Risk  06/15/2018 03/03/2018 12/16/2017 09/09/2017 07/24/2017  Falls in the past year? Yes No No Yes Yes  Number falls in past yr: 1 - - 2 or more 2 or more  Injury with Fall? No - - - -  Risk Factor Category  - - - High Fall Risk -  Risk for fall due to : Impaired  vision;Impaired balance/gait;History of fall(s);Other (Comment);Mental status change - - History of fall(s) -  Risk for fall due to: Comment wears eyeglasses; blind L eye; cataract L eye; needs w/c to get around; ambulates when needed with cane or walker - - - -  Follow up Falls evaluation completed;Education provided;Falls prevention discussed - - - Falls evaluation completed   FALL RISK PREVENTION PERTAINING TO HOME: Is your home free of loose throw rugs in walkways, pet beds, electrical cords, etc? Yes Is there adequate lighting in your home to reduce risk of falls?  Yes Are there stairs in or around your home WITH handrails? Yes  ASSISTIVE DEVICES UTILIZED TO PREVENT FALLS: Use of a cane, walker or w/c? Yes, use of cane, walker and w/c Grab bars in the bathroom? No  Shower chair or a place to sit while bathing? Yes An elevated toilet seat or a handicapped toilet? Yes  Timed Get Up and Go Performed: Unable to perform d/t hip pain  Community Resource Referral:  Pt declined my offer to send [employer] Referral to Care Guide for installation of grab bars in the shower.  Depression Screen PHQ 2/9 Scores 06/15/2018 03/03/2018 12/16/2017 09/09/2017  PHQ - 2 Score 0 0 0 0  PHQ- 9 Score 0 - - -     Cognitive Function     6CIT Screen 06/15/2018 04/20/2017  What Year? 4 points 4 points  What month? 0 points 0 points  What time? 3 points 3 points  Count back from 20 0 points 0 points  Months in reverse 4 points 2 points  Repeat phrase 8 points 10 points  Total Score 19 19    Immunization History  Administered Date(s) Administered  . Influenza, High Dose Seasonal PF 09/09/2017  . Influenza,inj,Quad PF,6+ Mos 02/15/2016  . Pneumococcal Conjugate-13 03/24/2018, 03/24/2018    Qualifies for Shingles Vaccine? Yes. Due for Shingrix. Education has been provided regarding the importance of this vaccine. Pt has been advised to call  insurance company to determine out of pocket expense.  Advised may also receive vaccine at local pharmacy or Health Dept. Verbalized acceptance and understanding.  Due for Tdap vaccine. Education has been provided regarding the importance of this vaccine. Advised may receive this vaccine at local pharmacy or Health Dept. Aware to provide a copy of the vaccination record if obtained from local pharmacy or Health Dept. Verbalized acceptance and understanding.  Screening Tests Health Maintenance  Topic Date Due  . DEXA SCAN  07/21/2003  . TETANUS/TDAP  11/17/2026 (Originally 07/20/1955)  . INFLUENZA VACCINE  06/17/2018  . PNA vac Low Risk Adult (2 of 2 - PPSV23) 03/25/2019    Cancer Screenings: Lung: Low Dose CT Chest recommended if Age 46-80 years, 30 pack-year currently smoking OR have quit w/in 15years. Patient does not qualify. Breast Screenings: No longer required   Up to date of Bone Density/Dexa? No. Ordered 01/20/18. Message sent to referral coordinator for scheduling purposes.  Colorectal: Attempt was made for pt to undergo upper endo and colonoscopy on 06/09/18. Pt refused to complete bowel prep to complete exam. Colorectal screenings no longer required at this time.  Additional Screenings: Hepatitis C Screening: Does not qualify    Plan:  I have personally reviewed and addressed the Medicare Annual Wellness questionnaire and have noted the following in the patient's chart:  A. Medical and social history B. Use of alcohol, tobacco or illicit drugs  C. Current medications and supplements D. Functional ability and status E.  Nutritional status F.  Physical activity G. Advance directives H. List of other physicians I.  Hospitalizations, surgeries, and ER visits in previous 12 months J.  [place] such as hearing and vision if needed, cognitive and depression L. Referrals and appointments  In addition, I have reviewed and discussed with patient certain preventive protocols, quality metrics, and best practice recommendations.  A written personalized care plan for preventive services as well as general preventive health recommendations were provided to patient.  See attached scanned questionnaire for additional information.   Signed,  Aleatha Borer, LPN Nurse Health Advisor

## 2018-06-15 NOTE — Patient Instructions (Addendum)
Kaitlyn Gonzalez , Thank you for taking time to come for your Medicare Wellness Visit. I appreciate your ongoing commitment to your health goals. Please review the following plan we discussed and let me know if I can assist you in the future.   Screening recommendations/referrals: Colorectal Screening: No longer required Mammogram: No longer required Bone Density: You will receive a call from our office regarding your appointment  Vision and Dental Exams: Recommended annual ophthalmology exams for early detection of glaucoma and other disorders of the eye Recommended annual dental exams for proper oral hygiene  Vaccinations: Influenza vaccine: Up to date Pneumococcal vaccine: Up to date Tdap vaccine: Declined. Please call your insurance company to determine your out of pocket expense. You may also receive this vaccine at your local pharmacy or Health Dept. Shingles vaccine: Please call your insurance company to determine your out of pocket expense for the Shingrix vaccine. You may also receive this vaccine at your local pharmacy or Health Dept.  Advanced directives: Advance directive discussed with you today. I have provided a copy for you to complete at home and have notarized. Once this is complete please bring a copy in to our office so we can scan it into your chart.  Goals: Recommend to drink at least 6-8 8oz glasses of water per day.  Next appointment: Please schedule your Annual Wellness Visit with your Nurse Health Advisor in one year.  Preventive Care 24 Years and Older, Female Preventive care refers to lifestyle choices and visits with your health care provider that can promote health and wellness. What does preventive care include?  A yearly physical exam. This is also called an annual well check.  Dental exams once or twice a year.  Routine eye exams. Ask your health care provider how often you should have your eyes checked.  Personal lifestyle choices, including:  Daily  care of your teeth and gums.  Regular physical activity.  Eating a healthy diet.  Avoiding tobacco and drug use.  Limiting alcohol use.  Practicing safe sex.  Taking low-dose aspirin every day.  Taking vitamin and mineral supplements as recommended by your health care provider. What happens during an annual well check? The services and screenings done by your health care provider during your annual well check will depend on your age, overall health, lifestyle risk factors, and family history of disease. Counseling  Your health care provider may ask you questions about your:  Alcohol use.  Tobacco use.  Drug use.  Emotional well-being.  Home and relationship well-being.  Sexual activity.  Eating habits.  History of falls.  Memory and ability to understand (cognition).  Work and work Statistician.  Reproductive health. Screening  You may have the following tests or measurements:  Height, weight, and BMI.  Blood pressure.  Lipid and cholesterol levels. These may be checked every 5 years, or more frequently if you are over 45 years old.  Skin check.  Lung cancer screening. You may have this screening every year starting at age 40 if you have a 30-pack-year history of smoking and currently smoke or have quit within the past 15 years.  Fecal occult blood test (FOBT) of the stool. You may have this test every year starting at age 62.  Flexible sigmoidoscopy or colonoscopy. You may have a sigmoidoscopy every 5 years or a colonoscopy every 10 years starting at age 1.  Hepatitis C blood test.  Hepatitis B blood test.  Sexually transmitted disease (STD) testing.  Diabetes screening. This is  done by checking your blood sugar (glucose) after you have not eaten for a while (fasting). You may have this done every 1-3 years.  Bone density scan. This is done to screen for osteoporosis. You may have this done starting at age 61.  Mammogram. This may be done every 1-2  years. Talk to your health care provider about how often you should have regular mammograms. Talk with your health care provider about your test results, treatment options, and if necessary, the need for more tests. Vaccines  Your health care provider may recommend certain vaccines, such as:  Influenza vaccine. This is recommended every year.  Tetanus, diphtheria, and acellular pertussis (Tdap, Td) vaccine. You may need a Td booster every 10 years.  Zoster vaccine. You may need this after age 18.  Pneumococcal 13-valent conjugate (PCV13) vaccine. One dose is recommended after age 54.  Pneumococcal polysaccharide (PPSV23) vaccine. One dose is recommended after age 3. Talk to your health care provider about which screenings and vaccines you need and how often you need them. This information is not intended to replace advice given to you by your health care provider. Make sure you discuss any questions you have with your health care provider. Document Released: 11/30/2015 Document Revised: 07/23/2016 Document Reviewed: 09/04/2015 Elsevier Interactive Patient Education  2017 [place] Prevention in the Home Falls can cause injuries. They can happen to people of all ages. There are many things you can do to make your home safe and to help prevent falls. What can I do on the outside of my home?  Regularly fix the edges of walkways and driveways and fix any cracks.  Remove anything that might make you trip as you walk through a door, such as a raised step or threshold.  Trim any bushes or trees on the path to your home.  Use bright outdoor lighting.  Clear any walking paths of anything that might make someone trip, such as rocks or tools.  Regularly check to see if handrails are loose or broken. Make sure that both sides of any steps have handrails.  Any raised decks and porches should have guardrails on the edges.  Have any leaves, snow, or ice cleared regularly.  Use  sand or salt on walking paths during winter.  Clean up any spills in your garage right away. This includes oil or grease spills. What can I do in the bathroom?  Use night lights.  Install grab bars by the toilet and in the tub and shower. Do not use towel bars as grab bars.  Use non-skid mats or decals in the tub or shower.  If you need to sit down in the shower, use a plastic, non-slip stool.  Keep the floor dry. Clean up any water that spills on the floor as soon as it happens.  Remove soap buildup in the tub or shower regularly.  Attach bath mats securely with double-sided non-slip rug tape.  Do not have throw rugs and other things on the floor that can make you trip. What can I do in the bedroom?  Use night lights.  Make sure that you have a light by your bed that is easy to reach.  Do not use any sheets or blankets that are too big for your bed. They should not hang down onto the floor.  Have a firm chair that has side arms. You can use this for support while you get dressed.  Do not have throw rugs and other things  on the floor that can make you trip. What can I do in the kitchen?  Clean up any spills right away.  Avoid walking on wet floors.  Keep items that you use a lot in easy-to-reach places.  If you need to reach something above you, use a strong step stool that has a grab bar.  Keep electrical cords out of the way.  Do not use floor polish or wax that makes floors slippery. If you must use wax, use non-skid floor wax.  Do not have throw rugs and other things on the floor that can make you trip. What can I do with my stairs?  Do not leave any items on the stairs.  Make sure that there are handrails on both sides of the stairs and use them. Fix handrails that are broken or loose. Make sure that handrails are as long as the stairways.  Check any carpeting to make sure that it is firmly attached to the stairs. Fix any carpet that is loose or worn.  Avoid  having throw rugs at the top or bottom of the stairs. If you do have throw rugs, attach them to the floor with carpet tape.  Make sure that you have a light switch at the top of the stairs and the bottom of the stairs. If you do not have them, ask someone to add them for you. What else can I do to help prevent falls?  Wear shoes that:  Do not have high heels.  Have rubber bottoms.  Are comfortable and fit you well.  Are closed at the toe. Do not wear sandals.  If you use a stepladder:  Make sure that it is fully opened. Do not climb a closed stepladder.  Make sure that both sides of the stepladder are locked into place.  Ask someone to hold it for you, if possible.  Clearly mark and make sure that you can see:  Any grab bars or handrails.  First and last steps.  Where the edge of each step is.  Use tools that help you move around (mobility aids) if they are needed. These include:  Canes.  Walkers.  Scooters.  Crutches.  Turn on the lights when you go into a dark area. Replace any light bulbs as soon as they burn out.  Set up your furniture so you have a clear path. Avoid moving your furniture around.  If any of your floors are uneven, fix them.  If there are any pets around you, be aware of where they are.  Review your medicines with your doctor. Some medicines can make you feel dizzy. This can increase your chance of falling. Ask your doctor what other things that you can do to help prevent falls. This information is not intended to replace advice given to you by your health care provider. Make sure you discuss any questions you have with your health care provider. Document Released: 08/30/2009 Document Revised: 04/10/2016 Document Reviewed: 12/08/2014 Elsevier Interactive Patient Education  2017 [employer].

## 2018-06-15 NOTE — Telephone Encounter (Signed)
TOC #2. Called pt to f/u after d/c from [facility] on 06/11/18. Also wanted to confirm her hosp f/u appt w/ Dr. Sanda Klein on 06/17/18 @ 2:40pm. Discharge planning includes the following:  - start ensure and pantoprazole - discontinue use of ASA -Continue with regular diet - f/u with PCP and GI LVM requesting returned call.

## 2018-06-17 ENCOUNTER — Inpatient Hospital Stay: Payer: Medicare Other | Admitting: Family Medicine

## 2018-06-18 ENCOUNTER — Ambulatory Visit: Payer: Medicaid Other | Admitting: Gastroenterology

## 2018-06-24 ENCOUNTER — Encounter: Payer: Self-pay | Admitting: Family Medicine

## 2018-06-24 ENCOUNTER — Ambulatory Visit (INDEPENDENT_AMBULATORY_CARE_PROVIDER_SITE_OTHER): Payer: Medicare Other | Admitting: Family Medicine

## 2018-06-24 VITALS — BP 132/74 | HR 102 | Temp 98.5°F | Resp 20 | Ht 66.0 in | Wt 81.9 lb

## 2018-06-24 DIAGNOSIS — E538 Deficiency of other specified B group vitamins: Secondary | ICD-10-CM

## 2018-06-24 DIAGNOSIS — E43 Unspecified severe protein-calorie malnutrition: Secondary | ICD-10-CM | POA: Diagnosis not present

## 2018-06-24 DIAGNOSIS — I10 Essential (primary) hypertension: Secondary | ICD-10-CM | POA: Diagnosis not present

## 2018-06-24 DIAGNOSIS — Z720 Tobacco use: Secondary | ICD-10-CM

## 2018-06-24 DIAGNOSIS — D649 Anemia, unspecified: Secondary | ICD-10-CM

## 2018-06-24 DIAGNOSIS — Z66 Do not resuscitate: Secondary | ICD-10-CM

## 2018-06-24 MED ORDER — CYANOCOBALAMIN 1000 MCG/ML IJ SOLN
1000.0000 ug | Freq: Once | INTRAMUSCULAR | Status: AC
  Administered 2018-06-24: 1000 ug via INTRAMUSCULAR

## 2018-06-24 MED ORDER — PANTOPRAZOLE SODIUM 40 MG PO TBEC: 40.0000 mg | DELAYED_RELEASE_TABLET | Freq: Every day | ORAL | 2 refills | Status: DC

## 2018-06-24 MED ORDER — METOPROLOL SUCCINATE ER 50 MG PO TB24: 50.0000 mg | ORAL_TABLET | Freq: Every day | ORAL | 3 refills | Status: DC

## 2018-06-24 MED ORDER — VITAMIN B-12 500 MCG SL SUBL: SUBLINGUAL_TABLET | SUBLINGUAL | 12 refills | Status: DC

## 2018-06-24 NOTE — Progress Notes (Signed)
BP 132/74 (BP Location: Right Arm, Patient Position: Sitting, Cuff Size: Small)   Pulse (!) 102   Temp 98.5 F (36.9 C) (Oral)   Resp 20   Ht 5\' 6"  (1.676 m)   Wt 81 lb 14.4 oz (37.1 kg)   SpO2 94%   BMI 13.22 kg/m    Subjective:    Patient ID: Kaitlyn Gonzalez, female    DOB: 12/21/1935, 82 y.o.   MRN: [id number]  HPI: Kaitlyn Gonzalez is a 82 y.o. female  Chief Complaint  Patient presents with  . Hospitalization Follow-up    HPI Patient is here for hospitalization follow-up  She was supposed to have had an EGD and colonoscopy; she was admitted to the hospital and then decided to not undergo the procedure  Labs from the hospital show microcytic anemia; last H/H 8.3/25.6; normal folate Vitamin B12 was low at 233 Ferritin level low at 9, down from 12 three months ago; iron was low at 14, down from 39 three months ago  Our LPN tried to reach her twice for transitions of care; left messages asking for return call Instructions were to stop aspirin; start ensure and PPI (pantoprazole) She is to f/u with GI as well  She saw the LPN on July 30ZS; only drinking Ensure when she thinks about it; cost is an issue, to a C3 referral was made to assist her with this; she is malnourished; consider palliative care discussed at last visit She does not drive at all; three children live with her, go to grocery store for her  She continues to smoke  She has always been little; family is not big; always 110 pounds, just due to age her family says  No dark stools; medium brown color; no visible blood  They have the advanced care direction paperwork  Son can bring her on Wednesday  Depression screen [facility] 2/9 06/24/2018 06/15/2018 03/03/2018 12/16/2017 09/09/2017  Decreased Interest 0 0 0 0 0  Down, Depressed, Hopeless 0 0 0 0 0  PHQ - 2 Score 0 0 0 0 0  Altered sleeping - 0 - - -  Tired, decreased energy - 0 - - -  Change in appetite - 0 - - -  Feeling bad or failure about  yourself  - 0 - - -  Trouble concentrating - 0 - - -  Moving slowly or fidgety/restless - 0 - - -  Suicidal thoughts - 0 - - -  PHQ-9 Score - 0 - - -  Difficult doing work/chores - Not difficult at all - - -    Relevant past medical, surgical, family and social history reviewed Past Medical History:  Diagnosis Date  . Dyslipidemia   . History of stroke with residual effects    Stroke in April 2016.  Marland Kitchen Hypertension   . Legally blind in right eye, as defined in [place]   . Peripheral vascular disease ([place])   . Stroke [facility])    Past Surgical History:  Procedure Laterality Date  . PERIPHERAL ARTERIAL STENT GRAFT Left 2010   Family History  Problem Relation Age of Onset  . Diabetes Sister   . Cancer Sister        breast  . Alzheimer's disease Mother   . Dementia Mother   . AAA (abdominal aortic aneurysm) Father   . Stroke Brother   . Hyperlipidemia Sister   . Hypertension Sister   . Stroke Brother   . Hypertension Brother    Social History  Tobacco Use  . Smoking status: Current Every Day Smoker    Packs/day: 0.25    Years: 62.00    Pack years: 15.50    Types: Cigarettes    Start date: 11/18/1955  . Smokeless tobacco: Never Used  . Tobacco comment: Down to about 1/2 pack a day from 2ppd 03/03/2018  Substance Use Topics  . Alcohol use: No    Alcohol/week: 0.0 standard drinks  . Drug use: No    Interim medical history since last visit reviewed. Allergies and medications reviewed  Review of Systems Per HPI unless specifically indicated above     Objective:    BP 132/74 (BP Location: Right Arm, Patient Position: Sitting, Cuff Size: Small)   Pulse (!) 102   Temp 98.5 F (36.9 C) (Oral)   Resp 20   Ht 5\' 6"  (1.676 m)   Wt 81 lb 14.4 oz (37.1 kg)   SpO2 94%   BMI 13.22 kg/m   Wt Readings from Last 3 Encounters:  06/24/18 81 lb 14.4 oz (37.1 kg)  06/15/18 84 lb 1.6 oz (38.1 kg)  06/09/18 85 lb 4.8 oz (38.7 kg)    Physical Exam  Constitutional: She appears  well-developed and well-nourished.  Thin, cachectic female, temporal wasting; no distress, good historian  HENT:  Mouth/Throat: Mucous membranes are normal.  Eyes: EOM are normal. No scleral icterus.  Cardiovascular: Regular rhythm. Tachycardia present.  Pulmonary/Chest: Effort normal and breath sounds normal.  Neurological:  Significant muscle wasting of extremities; gait not assessed, in wheelchair  Psychiatric: She has a normal mood and affect. Her behavior is normal. Her mood appears not anxious. She does not exhibit a depressed mood.    Results for orders placed or performed during the hospital encounter of 06/09/18  Iron and TIBC  Result Value Ref Range   Iron 14 (L) 28 - 170 ug/dL   TIBC 446 250 - 450 ug/dL   Saturation Ratios 3 (L) 10.4 - 31.8 %   UIBC 432 ug/dL  Vitamin B12  Result Value Ref Range   Vitamin B-12 233 180 - 914 pg/mL  Folate RBC  Result Value Ref Range   Folate, Hemolysate 264.6 Not Estab. ng/mL   Hematocrit 28.7 (L) 34.0 - 46.6 %   Folate, RBC 922 >498 ng/mL  CBC  Result Value Ref Range   WBC 8.6 3.6 - 11.0 K/uL   RBC 3.72 (L) 3.80 - 5.20 MIL/uL   Hemoglobin 9.2 (L) 12.0 - 16.0 g/dL   HCT 28.8 (L) 35.0 - 47.0 %   MCV 77.5 (L) 80.0 - 100.0 fL   MCH 24.8 (L) 26.0 - 34.0 pg   MCHC 31.9 (L) 32.0 - 36.0 g/dL   RDW 20.7 (H) 11.5 - 14.5 %   Platelets 375 150 - 440 K/uL  Folate  Result Value Ref Range   Folate 9.5 >5.9 ng/mL  Comprehensive metabolic panel  Result Value Ref Range   Sodium 139 135 - 145 mmol/L   Potassium 4.1 3.5 - 5.1 mmol/L   Chloride 107 98 - 111 mmol/L   CO2 25 22 - 32 mmol/L   Glucose, Bld 88 70 - 99 mg/dL   BUN 11 8 - 23 mg/dL   Creatinine, Ser 0.55 0.44 - 1.00 mg/dL   Calcium 9.0 8.9 - 10.3 mg/dL   Total Protein 7.6 6.5 - 8.1 g/dL   Albumin 3.6 3.5 - 5.0 g/dL   AST 14 (L) 15 - 41 U/L   ALT 12 0 - 44 U/L  Alkaline Phosphatase 69 38 - 126 U/L   Total Bilirubin 0.5 0.3 - 1.2 mg/dL   GFR calc non Af Amer >60 >60 mL/min    GFR calc Af Amer >60 >60 mL/min   Anion gap 7 5 - 15  Ferritin  Result Value Ref Range   Ferritin 9 (L) 11 - 307 ng/mL  CBC  Result Value Ref Range   WBC 7.4 3.6 - 11.0 K/uL   RBC 3.32 (L) 3.80 - 5.20 MIL/uL   Hemoglobin 8.3 (L) 12.0 - 16.0 g/dL   HCT 25.6 (L) 35.0 - 47.0 %   MCV 77.2 (L) 80.0 - 100.0 fL   MCH 24.9 (L) 26.0 - 34.0 pg   MCHC 32.3 32.0 - 36.0 g/dL   RDW 21.0 (H) 11.5 - 14.5 %   Platelets 335 150 - 440 K/uL      Assessment & Plan:   Problem List Items Addressed This Visit      Cardiovascular and Mediastinum   Hypertension    Well-controlled today      Relevant Medications   metoprolol succinate (TOPROL-XL) 50 MG 24 hr tablet     Other   Anemia - Primary    Stool cards given; discussion about how aggressive to be if something like cancer is the cause; she will discuss with family and let me know      Relevant Medications   cyanocobalamin ((VITAMIN B-12)) injection 1,000 mcg (Completed)   Cyanocobalamin (VITAMIN B-12) 500 MCG SUBL   Vitamin B12 deficiency   Relevant Medications   cyanocobalamin ((VITAMIN B-12)) injection 1,000 mcg (Completed)   Tobacco abuse    Not interested in quitting      Protein-calorie malnutrition, severe    Encouraged ensure and good eating; C3 referral to help with ensure; likely malignancy, but patient has not undergone work-up with GI; we had a long discussion about goals of care, would she want treatment if something found, etc; she will talk with her family and decide if she wants to undergo further testing (EGD, colonoscopy, etc.) and let me know      DNR (do not resuscitate)    Lengthy discussion today with patient and family member; patient is reluctant to undergo further testing and treatment, it sounds like; she was encouraged to talk this through with her family members, develop plan of care, how aggressive or non-aggressive she wishes to be; we talked about whether or not she would want to know if she has cancer and  then whether or not she would want to undergo treatment, whether that would be chemo or something else; she would certainly be a significant surgical risk; she will talk with family and let me know ifs he wants to pursue anything else; I am happy to refer her back to GI or she can opt to enjoy her days with her family and remain out of the hospital and doctor's offices if she chooses; it is up to her      Relevant Orders   DNR (Do Not Resuscitate)       Follow up plan: Return in about 3 months (around 09/24/2018) for follow-up visit with Dr. Sanda Klein.  An after-visit summary was printed and given to the patient at [place].  Please see the patient instructions which may contain other information and recommendations beyond what is mentioned above in the assessment and plan.  Meds ordered this encounter  Medications  . metoprolol succinate (TOPROL-XL) 50 MG 24 hr tablet    Sig: Take  1 tablet (50 mg total) by mouth daily. Take with or immediately following a meal.    Dispense:  90 tablet    Refill:  3    Cancel the 25 mg strength metoprolol and cancel 5 mg amlodipine; just using one pill now  . pantoprazole (PROTONIX) 40 MG tablet    Sig: Take 1 tablet (40 mg total) by mouth daily.    Dispense:  30 tablet    Refill:  2  . cyanocobalamin ((VITAMIN B-12)) injection 1,000 mcg  . Cyanocobalamin (VITAMIN B-12) 500 MCG SUBL    Sig: One tablet under the tongue, dissolved; once a day    Dispense:  30 tablet    Refill:  12    Orders Placed This Encounter  Procedures  . DNR (Do Not Resuscitate)   Face-to-face time with patient was more than 25 minutes, >50% time spent counseling and coordination of care

## 2018-06-24 NOTE — Assessment & Plan Note (Addendum)
Encouraged ensure and good eating; C3 referral to help with ensure; likely malignancy, but patient has not undergone work-up with GI; we had a long discussion about goals of care, would she want treatment if something found, etc; she will talk with her family and decide if she wants to undergo further testing (EGD, colonoscopy, etc.) and let me know

## 2018-06-24 NOTE — Assessment & Plan Note (Signed)
Stool cards given; discussion about how aggressive to be if something like cancer is the cause; she will discuss with family and let me know

## 2018-06-24 NOTE — Patient Instructions (Addendum)
We'll give you the B12 shot today Afterwards, you can take 500 or 1000 mcg of SUBLINGUAL vitamin B12 under your tongue every day Return the stool cards at your convenience Let me know what I can do for you

## 2018-06-24 NOTE — Assessment & Plan Note (Signed)
Well controlled today.

## 2018-06-30 DIAGNOSIS — E538 Deficiency of other specified B group vitamins: Secondary | ICD-10-CM | POA: Insufficient documentation

## 2018-06-30 DIAGNOSIS — Z66 Do not resuscitate: Secondary | ICD-10-CM | POA: Insufficient documentation

## 2018-06-30 NOTE — Assessment & Plan Note (Signed)
Lengthy discussion today with patient and family member; patient is reluctant to undergo further testing and treatment, it sounds like; she was encouraged to talk this through with her family members, develop plan of care, how aggressive or non-aggressive she wishes to be; we talked about whether or not she would want to know if she has cancer and then whether or not she would want to undergo treatment, whether that would be chemo or something else; she would certainly be a significant surgical risk; she will talk with family and let me know ifs he wants to pursue anything else; I am happy to refer her back to GI or she can opt to enjoy her days with her family and remain out of the hospital and doctor's offices if she chooses; it is up to her

## 2018-06-30 NOTE — Assessment & Plan Note (Signed)
Not interested in quitting 

## 2018-07-21 ENCOUNTER — Ambulatory Visit: Payer: Medicaid Other | Admitting: Gastroenterology

## 2018-07-23 NOTE — Progress Notes (Deleted)
NEUROLOGY FOLLOW UP OFFICE NOTE  Latishia Micheala Morissette [id number]  HISTORY OF PRESENT ILLNESS: Kaitlyn Gonzalez is an 82 year old right-handed woman with hypertension, hyperlipidemia, peripheral vascular disease and legally blind in right eye who follows up for stroke, bilateral carotid artery stenosis and cerebral aneurysm.  She is accompanied by her *** who supplements history.  UPDATE: Medications include:  ASA 81mg  daily, atorvastatin 40mg , *** 03/03/18: LDL 60, Hgb A1c 5.0  HISTORY: She was admitted to [facility] from 03/12/15 to 03/13/15 for acute stroke.  At that time, she presented with left sided weakness.  CT of head was negative for acute changes.  MRI of brain demonstrated acute ischemic infarct in the right centrum semiovale.  MRA of head showed 2 mm partially thrombosed left cavernous carotid artery aneurysm but no significant intracranial stenosis.  MRA of neck showed 65% proximal right ICA stenosis and 50% proximal left ICA stenosis.  Hgb A1c was 5.3.  Lipid panel showed LDL of 99.  Echo showed EF 60-65% with no cardiac source of embolus.  She was not taking any medications at the time and was started on ASA 81mg  daily, simvastatin 20mg  daily, amlodipine and metoprolol.  She continues to have some left sided weakness.  She has some pain and weakness in legs due to peripheral vascular disease.  She has been evaluated by cardiology for systolic murmur.  She had a 24 hour Holter monitor in June, which was unremarkable.  Echo again demonstrated EF 60-65% with grade 1 diastolic dysfunction.  PAST MEDICAL HISTORY: Past Medical History:  Diagnosis Date  . Dyslipidemia   . History of stroke with residual effects    Stroke in April 2016.  Marland Kitchen Hypertension   . Legally blind in right eye, as defined in [place]   . Peripheral vascular disease ([place])   . Stroke [facility])     MEDICATIONS: Current Outpatient Medications on File Prior to Visit  Medication Sig Dispense Refill  . atorvastatin (LIPITOR)  40 MG tablet Take 1 tablet (40 mg total) by mouth daily at 6 PM. 90 tablet 0  . Cyanocobalamin (VITAMIN B-12) 500 MCG SUBL One tablet under the tongue, dissolved; once a day 30 tablet 12  . fluticasone-salmeterol (ADVAIR HFA) 115-21 MCG/ACT inhaler Inhale 2 puffs into the lungs 2 (two) times daily. Rinse mouth after use. 1 Inhaler 12  . metoprolol succinate (TOPROL-XL) 50 MG 24 hr tablet Take 1 tablet (50 mg total) by mouth daily. Take with or immediately following a meal. 90 tablet 3  . Misc. Devices [facility]) [place] Wheelchair for home use 1 each 0  . pantoprazole (PROTONIX) 40 MG tablet Take 1 tablet (40 mg total) by mouth daily. 30 tablet 2   No current facility-administered medications on file prior to visit.     ALLERGIES: No Known Allergies  FAMILY HISTORY: Family History  Problem Relation Age of Onset  . Diabetes Sister   . Cancer Sister        breast  . Alzheimer's disease Mother   . Dementia Mother   . AAA (abdominal aortic aneurysm) Father   . Stroke Brother   . Hyperlipidemia Sister   . Hypertension Sister   . Stroke Brother   . Hypertension Brother    ***.  SOCIAL HISTORY: Social History   Socioeconomic History  . Marital status: Divorced    Spouse name: Not on file  . Number of children: 5  . Years of education: Not on file  . Highest education level: 12th grade  Occupational History  . Occupation: Retired  Scientific laboratory technician  . Financial resource strain: Somewhat hard  . Food insecurity:    Worry: Sometimes true    Inability: Sometimes true  . Transportation needs:    Medical: No    Non-medical: No  Tobacco Use  . Smoking status: Current Every Day Smoker    Packs/day: 0.25    Years: 62.00    Pack years: 15.50    Types: Cigarettes    Start date: 11/18/1955  . Smokeless tobacco: Never Used  . Tobacco comment: Down to about 1/2 pack a day from 2ppd 03/03/2018  Substance and Sexual Activity  . Alcohol use: No    Alcohol/week: 0.0 standard drinks  . Drug  use: No  . Sexual activity: Not Currently  Lifestyle  . Physical activity:    Days per week: 0 days    Minutes per session: 0 min  . Stress: Not at all  Relationships  . Social connections:    Talks on phone: Patient refused    Gets together: Patient refused    Attends religious service: Patient refused    Active member of club or organization: Patient refused    Attends meetings of clubs or organizations: Patient refused    Relationship status: Divorced  . Intimate partner violence:    Fear of current or ex partner: No    Emotionally abused: No    Physically abused: No    Forced sexual activity: No  Other Topics Concern  . Not on file  Social History Narrative  . Not on file    REVIEW OF SYSTEMS: Constitutional: No fevers, chills, or sweats, no generalized fatigue, change in appetite Eyes: No visual changes, double vision, eye pain Ear, nose and throat: No hearing loss, ear pain, nasal congestion, sore throat Cardiovascular: No chest pain, palpitations Respiratory:  No shortness of breath at rest or with exertion, wheezes GastrointestinaI: No nausea, vomiting, diarrhea, abdominal pain, fecal incontinence Genitourinary:  No dysuria, urinary retention or frequency Musculoskeletal:  No neck pain, back pain Integumentary: No rash, pruritus, skin lesions Neurological: as above Psychiatric: No depression, insomnia, anxiety Endocrine: No palpitations, fatigue, diaphoresis, mood swings, change in appetite, change in weight, increased thirst Hematologic/Lymphatic:  No purpura, petechiae. Allergic/Immunologic: no itchy/runny eyes, nasal congestion, recent allergic reactions, rashes  PHYSICAL EXAM: *** General: No acute distress.  Patient appears ***-groomed.  *** body habitus. Head:  Normocephalic/atraumatic Eyes:  Fundi examined but not visualized Neck: supple, no paraspinal tenderness, full range of motion Heart:  Regular rate and rhythm Lungs:  Clear to auscultation  bilaterally Back: No paraspinal tenderness Neurological Exam: alert and oriented to person, place, and time. Attention span and concentration intact, recent and remote memory intact, fund of knowledge intact.  Speech fluent and not dysarthric, language intact.  Right eye scarring.  Left pupil round and reactive to light, blind in right eye.  Otherwise, CN II-XII intact. Bulk and tone normal, muscle strength 4-/5 left upper and bilateral lower extremities.  Sensation to light touch  intact.  Deep tendon reflexes 2+ throughout.  Finger to nose testing intact.  Weak in legs/nonambulatory in wheelchair  IMPRESSION: CVA Bilateral carotid artery stenosis Cerebral aneurysm (left cavernous carotid artery) HTN Tobacco use disorder  PLAN: 1.  ASA 81mg  daily for secondary stroke prevention 2.  Atorvastatin *** daily (LDL goal should be less than 70) 3.  Carotid doppler 4.  CTA of head 5.  ***  Metta Clines, DO  CC: Enid Derry, MD

## 2018-07-26 ENCOUNTER — Ambulatory Visit: Payer: Medicaid Other | Admitting: Neurology

## 2018-09-03 ENCOUNTER — Ambulatory Visit: Payer: Medicaid Other | Admitting: Gastroenterology

## 2018-09-03 ENCOUNTER — Encounter: Payer: Self-pay | Admitting: Gastroenterology

## 2018-09-03 DIAGNOSIS — D508 Other iron deficiency anemias: Secondary | ICD-10-CM

## 2018-09-24 ENCOUNTER — Ambulatory Visit (INDEPENDENT_AMBULATORY_CARE_PROVIDER_SITE_OTHER): Payer: Medicare Other | Admitting: Family Medicine

## 2018-09-24 ENCOUNTER — Encounter: Payer: Self-pay | Admitting: Family Medicine

## 2018-09-24 VITALS — BP 136/76 | HR 97 | Temp 98.6°F | Ht 66.0 in | Wt 81.5 lb

## 2018-09-24 DIAGNOSIS — D649 Anemia, unspecified: Secondary | ICD-10-CM

## 2018-09-24 DIAGNOSIS — Z23 Encounter for immunization: Secondary | ICD-10-CM

## 2018-09-24 DIAGNOSIS — J439 Emphysema, unspecified: Secondary | ICD-10-CM

## 2018-09-24 DIAGNOSIS — Z5181 Encounter for therapeutic drug level monitoring: Secondary | ICD-10-CM

## 2018-09-24 DIAGNOSIS — Z720 Tobacco use: Secondary | ICD-10-CM | POA: Diagnosis not present

## 2018-09-24 DIAGNOSIS — I700 Atherosclerosis of aorta: Secondary | ICD-10-CM | POA: Diagnosis not present

## 2018-09-24 DIAGNOSIS — E785 Hyperlipidemia, unspecified: Secondary | ICD-10-CM

## 2018-09-24 NOTE — Patient Instructions (Signed)
Do start back on the vitamins We'll contact you about the labs

## 2018-09-24 NOTE — Assessment & Plan Note (Signed)
Check labs; did have the colonoscpy done; no visible blood

## 2018-09-24 NOTE — Assessment & Plan Note (Signed)
Still smoking, but I am here to help if needed; see AVS; not taking breathing medicine by choice

## 2018-09-24 NOTE — Progress Notes (Signed)
BP 136/76   Pulse 97   Temp 98.6 F (37 C)   Ht 5\' 6"  (1.676 m)   Wt 81 lb 8 oz (37 kg)   SpO2 93%   BMI 13.15 kg/m    Subjective:    Patient ID: Kaitlyn Gonzalez, female    DOB: Dec 10, 1935, 82 y.o.   MRN: [id number]  HPI: Kaitlyn Gonzalez is a 82 y.o. female  Chief Complaint  Patient presents with  . Follow-up    HPI Patient is here for f/u No injury with recent fall; no LOC and did not hit head  High cholesterol; just did not get any more; no allergy or cost problem; cut back on pork  COPD; DR. Ashby Dawes sent in a year's worth of inhaler in May; she is probably not going to refill it; she did not think it helped; no problems with breathing right now; she is still smoking, down to 1/2 ppd; was up to 1.5 ppd at one point  HTN; not taking blood pressure pill right now; she has plenty of refills; just doesn't like to take pills; does not want to take cholesterol pills; code status is DNR  Taking aspirin; not taking vitamin B12  Depression screen [facility] 2/9 06/24/2018 06/15/2018 03/03/2018 12/16/2017 09/09/2017  Decreased Interest 0 0 0 0 0  Down, Depressed, Hopeless 0 0 0 0 0  PHQ - 2 Score 0 0 0 0 0  Altered sleeping - 0 - - -  Tired, decreased energy - 0 - - -  Change in appetite - 0 - - -  Feeling bad or failure about yourself  - 0 - - -  Trouble concentrating - 0 - - -  Moving slowly or fidgety/restless - 0 - - -  Suicidal thoughts - 0 - - -  PHQ-9 Score - 0 - - -  Difficult doing work/chores - Not difficult at all - - -   Fall Risk  09/24/2018 06/24/2018 06/15/2018 03/03/2018 12/16/2017  Falls in the past year? 1 Yes Yes No No  Number falls in past yr: 0 1 1 - -  Injury with Fall? 0 No No - -  Risk Factor Category  - - - - -  Risk for fall due to : - - Impaired vision;Impaired balance/gait;History of fall(s);Other (Comment);Mental status change - -  Risk for fall due to: Comment - - wears eyeglasses; blind L eye; cataract L eye; needs w/c to get around;  ambulates when needed with cane or walker - -  Follow up - - Falls evaluation completed;Education provided;Falls prevention discussed - -    Relevant past medical, surgical, family and social history reviewed Past Medical History:  Diagnosis Date  . Dyslipidemia   . History of stroke with residual effects    Stroke in April 2016.  Marland Kitchen Hypertension   . Legally blind in right eye, as defined in [place]   . Peripheral vascular disease ([place])   . Stroke [facility])    Past Surgical History:  Procedure Laterality Date  . PERIPHERAL ARTERIAL STENT GRAFT Left 2010   Family History  Problem Relation Age of Onset  . Diabetes Sister   . Cancer Sister        breast  . Alzheimer's disease Mother   . Dementia Mother   . AAA (abdominal aortic aneurysm) Father   . Stroke Brother   . Hyperlipidemia Sister   . Hypertension Sister   . Stroke Brother   . Hypertension Brother  Social History   Tobacco Use  . Smoking status: Current Every Day Smoker    Packs/day: 0.25    Years: 62.00    Pack years: 15.50    Types: Cigarettes    Start date: 11/18/1955  . Smokeless tobacco: Never Used  . Tobacco comment: Down to about 1/2 pack a day from 2ppd 03/03/2018  Substance Use Topics  . Alcohol use: No    Alcohol/week: 0.0 standard drinks  . Drug use: No     [facility] Visit from 09/24/2018 in [facility]  AUDIT-C Score  0      Interim medical history since last visit reviewed. Allergies and medications reviewed  Review of Systems  Gastrointestinal: Negative for blood in stool.  Genitourinary: Negative for hematuria.   Per HPI unless specifically indicated above     Objective:    BP 136/76   Pulse 97   Temp 98.6 F (37 C)   Ht 5\' 6"  (1.676 m)   Wt 81 lb 8 oz (37 kg)   SpO2 93%   BMI 13.15 kg/m   Wt Readings from Last 3 Encounters:  09/24/18 81 lb 8 oz (37 kg)  06/24/18 81 lb 14.4 oz (37.1 kg)  06/15/18 84 lb 1.6 oz (38.1 kg)    Physical Exam  Constitutional: She  appears well-developed.  Very thin mal-nourished appearing female, wasting of extremities, temporal wasting; seated in wheelchair  HENT:  Mouth/Throat: Mucous membranes are normal.  Eyes: EOM are normal. No scleral icterus.  Cardiovascular: Normal rate and regular rhythm.  Pulmonary/Chest: Effort normal and breath sounds normal.  Musculoskeletal: She exhibits no edema.  Psychiatric: She has a normal mood and affect. Her behavior is normal.      Assessment & Plan:   Problem List Items Addressed This Visit      Cardiovascular and Mediastinum   Atherosclerosis of abdominal aorta (HCC)    Not taking statin but will check lipids today; patient is willing to start back on statin if needed to keep LDL under 70      Relevant Medications   aspirin EC 81 MG tablet     Respiratory   Bullous emphysema ([place]) - Primary    Still smoking, but I am here to help if needed; see AVS; not taking breathing medicine by choice        Other   Tobacco abuse    She is not ready to quit      Dyslipidemia    Check lipids today      Relevant Orders   Lipid panel (Completed)   Anemia    Check labs; did have the colonoscpy done; no visible blood      Relevant Orders   CBC with Differential/Platelet (Completed)   Iron, TIBC and Ferritin Panel (Completed)    Other Visit Diagnoses    Need for influenza vaccination       Relevant Orders   Flu vaccine HIGH DOSE PF (Fluzone High dose) (Completed)   Medication monitoring encounter       Relevant Orders   COMPLETE METABOLIC PANEL WITH GFR (Completed)       Follow up plan: Return in about 3 months (around 12/25/2018).  An after-visit summary was printed and given to the patient at [place].  Please see the patient instructions which may contain other information and recommendations beyond what is mentioned above in the assessment and plan.  No orders of the defined types were placed in this encounter.   Orders Placed  This Encounter    Procedures  . Flu vaccine HIGH DOSE PF (Fluzone High dose)  . CBC with Differential/Platelet  . COMPLETE METABOLIC PANEL WITH GFR  . Lipid panel  . Iron, TIBC and Ferritin Panel

## 2018-09-24 NOTE — Assessment & Plan Note (Signed)
Not taking statin but will check lipids today; patient is willing to start back on statin if needed to keep LDL under 70

## 2018-09-24 NOTE — Assessment & Plan Note (Signed)
Check lipids today 

## 2018-09-24 NOTE — Assessment & Plan Note (Signed)
She is not ready to quit

## 2018-09-25 LAB — IRON,TIBC AND FERRITIN PANEL
%SAT: 4 % — AB (ref 16–45)
FERRITIN: 10 ng/mL — AB (ref 16–288)
Iron: 16 ug/dL — ABNORMAL LOW (ref 45–160)
TIBC: 409 ug/dL (ref 250–450)

## 2018-09-25 LAB — COMPLETE METABOLIC PANEL WITH GFR
AG RATIO: 1.4 (calc) (ref 1.0–2.5)
ALBUMIN MSPROF: 3.8 g/dL (ref 3.6–5.1)
ALKALINE PHOSPHATASE (APISO): 62 U/L (ref 33–130)
ALT: 7 U/L (ref 6–29)
AST: 13 U/L (ref 10–35)
BUN / CREAT RATIO: 20 (calc) (ref 6–22)
BUN: 8 mg/dL (ref 7–25)
CO2: 29 mmol/L (ref 20–32)
CREATININE: 0.4 mg/dL — AB (ref 0.60–0.88)
Calcium: 8.8 mg/dL (ref 8.6–10.4)
Chloride: 102 mmol/L (ref 98–110)
GFR, EST AFRICAN AMERICAN: 114 mL/min/{1.73_m2} (ref >=60)
GFR, EST NON AFRICAN AMERICAN: 98 mL/min/{1.73_m2} (ref >=60)
GLOBULIN: 2.8 g/dL (ref 1.9–3.7)
Glucose, Bld: 82 mg/dL (ref 65–99)
Potassium: 3.5 mmol/L (ref 3.5–5.3)
Sodium: 141 mmol/L (ref 135–146)
TOTAL PROTEIN: 6.6 g/dL (ref 6.1–8.1)
Total Bilirubin: 0.5 mg/dL (ref 0.2–1.2)

## 2018-09-25 LAB — CBC WITH DIFFERENTIAL/PLATELET
BASOS ABS: 23 {cells}/uL (ref 0–200)
BASOS PCT: 0.3 %
EOS ABS: 23 {cells}/uL (ref 15–500)
Eosinophils Relative: 0.3 %
HCT: 25.3 % — ABNORMAL LOW (ref 35.0–45.0)
Hemoglobin: 8.1 g/dL — ABNORMAL LOW (ref 11.7–15.5)
Lymphs Abs: 1115 cells/uL (ref 850–3900)
MCH: 27.7 pg (ref 27.0–33.0)
MCHC: 32 g/dL (ref 32.0–36.0)
MCV: 86.6 fL (ref 80.0–100.0)
MPV: 9.2 fL (ref 7.5–12.5)
Monocytes Relative: 6.5 %
NEUTROS PCT: 78.6 %
Neutro Abs: 6131 cells/uL (ref 1500–7800)
PLATELETS: 505 10*3/uL — AB (ref 140–400)
RBC: 2.92 10*6/uL — ABNORMAL LOW (ref 3.80–5.10)
RDW: 14.7 % (ref 11.0–15.0)
TOTAL LYMPHOCYTE: 14.3 %
WBC: 7.8 10*3/uL (ref 3.8–10.8)
WBCMIX: 507 {cells}/uL (ref 200–950)

## 2018-09-25 LAB — LIPID PANEL
CHOLESTEROL: 150 mg/dL (ref <200)
HDL: 39 mg/dL — ABNORMAL LOW (ref >50)
LDL Cholesterol (Calc): 96 mg/dL (calc)
Non-HDL Cholesterol (Calc): 111 mg/dL (calc) (ref <130)
Total CHOL/HDL Ratio: 3.8 (calc) (ref <5.0)
Triglycerides: 67 mg/dL (ref <150)

## 2018-09-27 ENCOUNTER — Telehealth: Payer: Self-pay

## 2018-09-27 DIAGNOSIS — D649 Anemia, unspecified: Secondary | ICD-10-CM

## 2018-09-27 NOTE — Telephone Encounter (Signed)
-----   Message from Arnetha Courser, MD sent at 09/26/2018 10:46 AM EST ----- Roselyn Reef, please let the patient know that she is still anemic. We usually consider blood transfusions when someone's hemoglobin goes below 8, and her level is 8.1. Her iron is quite low Please REFER to hematology to see about this, and perhaps they can give her an iron infusion and avoid the need for a blood transfusion We're going to really urge her to see one of the GI doctors, as she is likely losing blood from something in her GI tract, and this could be serious (cancer); please REFER back to GI if she is willing to go

## 2018-10-04 ENCOUNTER — Inpatient Hospital Stay: Payer: Medicare Other | Admitting: Oncology

## 2018-10-27 ENCOUNTER — Encounter (INDEPENDENT_AMBULATORY_CARE_PROVIDER_SITE_OTHER): Payer: Self-pay | Admitting: Vascular Surgery

## 2018-10-27 ENCOUNTER — Ambulatory Visit (INDEPENDENT_AMBULATORY_CARE_PROVIDER_SITE_OTHER): Payer: Medicare Other | Admitting: Vascular Surgery

## 2018-10-27 ENCOUNTER — Ambulatory Visit (INDEPENDENT_AMBULATORY_CARE_PROVIDER_SITE_OTHER): Payer: Medicare Other

## 2018-10-27 VITALS — BP 141/74 | HR 87 | Resp 16 | Ht 65.0 in | Wt 89.8 lb

## 2018-10-27 DIAGNOSIS — I714 Abdominal aortic aneurysm, without rupture, unspecified: Secondary | ICD-10-CM

## 2018-10-27 DIAGNOSIS — E785 Hyperlipidemia, unspecified: Secondary | ICD-10-CM | POA: Diagnosis not present

## 2018-10-27 DIAGNOSIS — I739 Peripheral vascular disease, unspecified: Secondary | ICD-10-CM

## 2018-10-27 DIAGNOSIS — F17210 Nicotine dependence, cigarettes, uncomplicated: Secondary | ICD-10-CM | POA: Diagnosis not present

## 2018-10-27 NOTE — Progress Notes (Signed)
Subjective:    Patient ID: Kaitlyn Gonzalez, female    DOB: 11/07/1936, 82 y.o.   MRN: [id number] Chief Complaint  Patient presents with  . Follow-up    6 MONTH ABI AAA   Patient presents for a 45-month AAA and peripheral artery disease follow-up.  The patient is seen with a family member.  Patient presents today without complaint. The patient denies any claudication like symptoms, rest pain or ulcers to her feet / toes.  The patient underwent a bilateral ABI which was notable for: Right: 0.66, biphasic anterior tibial and monophasic posterior tibial artery, Left: 0.92, biphasic anterior tibial artery with monophasic posterior tibial artery - no previous ABI for comparison.  The patient also underwent an abdominal aortic duplex which was notable for a abdominal aortic aneurysm measuring 4.2cm.  Previous measurement obtained on Mar 17, 2018 was 4.0cm.  Biphasic forms noted through the aorta and bilateral common iliac arteries. She denies any symptoms such as back pain, pulsatile abdominal masses or thrombosis in her extremities.  The patient does not do much ambulation however denies any rest pain or ulcer formation to the bilateral lower extremity.  The patient denies any fever, nausea or vomiting.  Review of Systems  Constitutional: Negative.   HENT: Negative.   Eyes: Negative.   Respiratory: Negative.   Cardiovascular:       AAA PAD  Gastrointestinal: Negative.   Endocrine: Negative.   Genitourinary: Negative.   Musculoskeletal: Negative.   Skin: Negative.   Allergic/Immunologic: Negative.   Neurological: Negative.   Hematological: Negative.   Psychiatric/Behavioral: Negative.       Objective:   Physical Exam  Constitutional: She is oriented to person, place, and time. She appears well-developed and well-nourished. No distress.  HENT:  Head: Normocephalic and atraumatic.  Right Ear: External ear normal.  Left Ear: External ear normal.  Eyes: Pupils are equal, round, and  reactive to light. Conjunctivae and EOM are normal.  Neck: Normal range of motion.  Cardiovascular: Normal rate, regular rhythm, normal heart sounds and intact distal pulses.  Pulses:      Radial pulses are 2+ on the right side, and 2+ on the left side.  Hard to palpate pedal pulses however the bilateral feet are warm.  There is a good capillary refill.   Pulmonary/Chest: Effort normal and breath sounds normal.  Musculoskeletal: Normal range of motion. She exhibits no edema.  Neurological: She is alert and oriented to person, place, and time.  Skin: Skin is warm and dry. She is not diaphoretic.  Psychiatric: She has a normal mood and affect. Her behavior is normal. Judgment and thought content normal.  Vitals reviewed.  BP (!) 141/74 (BP Location: Right Arm, Patient Position: Sitting)   Pulse 87   Resp 16   Ht 5\' 5"  (1.651 m)   Wt 89 lb 12.8 oz (40.7 kg)   BMI 14.94 kg/m   Past Medical History:  Diagnosis Date  . Dyslipidemia   . History of stroke with residual effects    Stroke in April 2016.  Marland Kitchen Hypertension   . Legally blind in right eye, as defined in [place]   . Peripheral vascular disease ([place])   . Stroke [facility])    Social History   Socioeconomic History  . Marital status: Divorced    Spouse name: Not on file  . Number of children: 5  . Years of education: Not on file  . Highest education level: 12th grade  Occupational History  . Occupation:  Retired  Scientific laboratory technician  . Financial resource strain: Somewhat hard  . Food insecurity:    Worry: Sometimes true    Inability: Sometimes true  . Transportation needs:    Medical: No    Non-medical: No  Tobacco Use  . Smoking status: Current Every Day Smoker    Packs/day: 0.25    Years: 62.00    Pack years: 15.50    Types: Cigarettes    Start date: 11/18/1955  . Smokeless tobacco: Never Used  . Tobacco comment: Down to about 1/2 pack a day from 2ppd 03/03/2018  Substance and Sexual Activity  . Alcohol use: No    Alcohol/week:  0.0 standard drinks  . Drug use: No  . Sexual activity: Not Currently  Lifestyle  . Physical activity:    Days per week: 0 days    Minutes per session: 0 min  . Stress: Not at all  Relationships  . Social connections:    Talks on phone: Patient refused    Gets together: Patient refused    Attends religious service: Patient refused    Active member of club or organization: Patient refused    Attends meetings of clubs or organizations: Patient refused    Relationship status: Divorced  . Intimate partner violence:    Fear of current or ex partner: No    Emotionally abused: No    Physically abused: No    Forced sexual activity: No  Other Topics Concern  . Not on file  Social History Narrative  . Not on file   Past Surgical History:  Procedure Laterality Date  . PERIPHERAL ARTERIAL STENT GRAFT Left 2010   Family History  Problem Relation Age of Onset  . Diabetes Sister   . Cancer Sister        breast  . Alzheimer's disease Mother   . Dementia Mother   . AAA (abdominal aortic aneurysm) Father   . Stroke Brother   . Hyperlipidemia Sister   . Hypertension Sister   . Stroke Brother   . Hypertension Brother    No Known Allergies     Assessment & Plan:  Patient presents for a 89-month AAA and peripheral artery disease follow-up.  The patient is seen with a family member.  Patient presents today without complaint. The patient denies any claudication like symptoms, rest pain or ulcers to her feet / toes.  The patient underwent a bilateral ABI which was notable for: Right: 0.66, biphasic anterior tibial and monophasic posterior tibial artery, Left: 0.92, biphasic anterior tibial artery with monophasic posterior tibial artery - no previous ABI for comparison.  The patient also underwent an abdominal aortic duplex which was notable for a abdominal aortic aneurysm measuring 4.2cm.  Previous measurement obtained on Mar 17, 2018 was 4.0cm.  Biphasic forms noted through the aorta and  bilateral common iliac arteries. She denies any symptoms such as back pain, pulsatile abdominal masses or thrombosis in her extremities.  The patient does not do much ambulation however denies any rest pain or ulcer formation to the bilateral lower extremity.  The patient denies any fever, nausea or vomiting.  1. AAA (abdominal aortic aneurysm) without rupture (HCC) - Stable Minimal growth in the patient's AAA noted on duplex today (4.2cm from 4.0cm) Patient presents without any symptoms Patient is physical exam is unremarkable The patient has an asymptomatic abdominal aortic aneurysm that is less than 5cm in maximal diameter.  I have reviewed the natural history of abdominal aortic aneurysm and the small risk  of rupture for aneurysm less than 5cm in size.  However, as these small aneurysms tend to enlarge over time, continued surveillance with ultrasound or CT scan is mandatory.  The patient's blood pressure is being adequately controlled however I have reviewed the importance of hypertension and lipid control and the importance of continuing his abstinence from tobacco.  The patient is also encouraged to exercise a minimum of 30 minutes 4 times a week.  Should the patient develop new onset abdominal or back pain or signs of peripheral embolization they are instructed to seek medical attention immediately and to alert the physician providing care that they have an aneurysm.  The patient voices their understanding.  - VAS [place] AAA DUPLEX; Future  2. Peripheral vascular disease ([place]) - Stable Patient with known peripheral artery disease At this time, the patient is asymptomatic Moderate disease to the right lower extremity mild disease to the left lower extremity At this time there is no indication for intervention I have discussed with the patient at length the risk factors for and pathogenesis of atherosclerotic disease and encouraged a healthy diet, regular exercise regimen and blood pressure /  glucose control.  The patient was encouraged to call the office in the interim if he experiences any claudication like symptoms, rest pain or ulcers to his feet / toes F/U 74months   - VAS [place] ABI WITH/WO TBI; Future  3. Dyslipidemia - Stable On ASA and statin Encouraged good control as its slows the progression of atherosclerotic disease  Current Outpatient Medications on File Prior to Visit  Medication Sig Dispense Refill  . aspirin EC 81 MG tablet Take 81 mg by mouth daily.    Marland Kitchen atorvastatin (LIPITOR) 40 MG tablet Take 1 tablet (40 mg total) by mouth daily at 6 PM. 90 tablet 0  . metoprolol succinate (TOPROL-XL) 50 MG 24 hr tablet Take 1 tablet (50 mg total) by mouth daily. Take with or immediately following a meal. 90 tablet 3  . Cyanocobalamin (VITAMIN B-12) 500 MCG SUBL One tablet under the tongue, dissolved; once a day (Patient not taking: Reported on 10/27/2018) 30 tablet 12   No current facility-administered medications on file prior to visit.    There are no Patient Instructions on file for this visit. No follow-ups on file.  Comer Devins A Genevie Elman, PA-C

## 2018-11-11 ENCOUNTER — Ambulatory Visit: Payer: Medicaid Other | Admitting: Gastroenterology

## 2018-11-11 ENCOUNTER — Encounter: Payer: Self-pay | Admitting: Gastroenterology

## 2018-11-11 DIAGNOSIS — D649 Anemia, unspecified: Secondary | ICD-10-CM

## 2018-11-11 NOTE — Progress Notes (Deleted)
Summary of history :  She is here to folllow up for iron deficiency anemia. Seen by Dr Marius Ditch in 05/2018 .Microcytic anemia since 06/2017 .She was seen by Dr. Sanda Klein in 02/2018 and was found to have declining hemoglobin 8.5, MCV 74.6, thrombocytosis. She was found to have severe iron deficiency with ferritin level 12, elevated TIBC.Borderline low B12, low iron studies in 05/2018 .   EGD+colonoscopy was recommended   Interval history   06/09/2018-  11/11/2018  EGD+colonoscopy was not done.  Did not follow up , repeat labs 09/24/18 showed low Hb 8.1 grams  For iron deficiency anemia. Also has low normal B12 levels. Did not get evaluated after her initial visit. I do not see any recent IV iron supplementation either.    Plan  1. Will call hematology for urgent IV iron , b12 supplementation  2. EGD+colonoscopy and if negative needs capsule study of the small bowel.  3. Urine analysis.  4. No NSAID's

## 2018-12-29 ENCOUNTER — Encounter: Payer: Self-pay | Admitting: Family Medicine

## 2018-12-29 ENCOUNTER — Ambulatory Visit (INDEPENDENT_AMBULATORY_CARE_PROVIDER_SITE_OTHER): Payer: Medicare Other | Admitting: Family Medicine

## 2018-12-29 VITALS — BP 120/64 | HR 99 | Temp 98.4°F | Ht 65.0 in | Wt 87.1 lb

## 2018-12-29 DIAGNOSIS — E785 Hyperlipidemia, unspecified: Secondary | ICD-10-CM | POA: Diagnosis not present

## 2018-12-29 DIAGNOSIS — R739 Hyperglycemia, unspecified: Secondary | ICD-10-CM

## 2018-12-29 DIAGNOSIS — I700 Atherosclerosis of aorta: Secondary | ICD-10-CM | POA: Diagnosis not present

## 2018-12-29 DIAGNOSIS — I739 Peripheral vascular disease, unspecified: Secondary | ICD-10-CM

## 2018-12-29 DIAGNOSIS — D509 Iron deficiency anemia, unspecified: Secondary | ICD-10-CM

## 2018-12-29 DIAGNOSIS — Z5181 Encounter for therapeutic drug level monitoring: Secondary | ICD-10-CM

## 2018-12-29 DIAGNOSIS — E43 Unspecified severe protein-calorie malnutrition: Secondary | ICD-10-CM

## 2018-12-29 DIAGNOSIS — G8929 Other chronic pain: Secondary | ICD-10-CM

## 2018-12-29 DIAGNOSIS — I714 Abdominal aortic aneurysm, without rupture, unspecified: Secondary | ICD-10-CM

## 2018-12-29 DIAGNOSIS — M79605 Pain in left leg: Secondary | ICD-10-CM

## 2018-12-29 DIAGNOSIS — M810 Age-related osteoporosis without current pathological fracture: Secondary | ICD-10-CM | POA: Diagnosis not present

## 2018-12-29 DIAGNOSIS — J439 Emphysema, unspecified: Secondary | ICD-10-CM

## 2018-12-29 DIAGNOSIS — I10 Essential (primary) hypertension: Secondary | ICD-10-CM

## 2018-12-29 DIAGNOSIS — D649 Anemia, unspecified: Secondary | ICD-10-CM

## 2018-12-29 DIAGNOSIS — E538 Deficiency of other specified B group vitamins: Secondary | ICD-10-CM

## 2018-12-29 DIAGNOSIS — R636 Underweight: Secondary | ICD-10-CM | POA: Diagnosis not present

## 2018-12-29 DIAGNOSIS — I77819 Aortic ectasia, unspecified site: Secondary | ICD-10-CM

## 2018-12-29 DIAGNOSIS — Z720 Tobacco use: Secondary | ICD-10-CM

## 2018-12-29 MED ORDER — VITAMIN B-12 500 MCG SL SUBL: SUBLINGUAL_TABLET | SUBLINGUAL | 12 refills | Status: AC

## 2018-12-29 NOTE — Progress Notes (Signed)
BP 120/64   Pulse 99   Temp 98.4 F (36.9 C)   Ht 5\' 5"  (1.651 m)   Wt 87 lb 1.6 oz (39.5 kg)   SpO2 94%   BMI 14.49 kg/m    Subjective:    Patient ID: Kaitlyn Gonzalez, female    DOB: 04/15/1936, 83 y.o.   MRN: [id number]  HPI: Kaitlyn Gonzalez is a 83 y.o. female  Chief Complaint  Patient presents with  . Follow-up    HPI Here for f/u with her son She had one fall since the last visit; she tried to fix breakfast, left leg gave out and fell; son was right there and helped out; no injuries, no LOC, no head injury  Smoking; making some progress she thinks but son chuckled 3-4 cigarettes a day; nothing keeping her from quitting, just habit she says  She is underweight; she has never been big She did not eat breakfast this morning; if her leg wasn't giving her so much pain, she would eat more; sleeps throug the pain; she saw a specialist; they did the stents for her circulation; that one leg is really bad at times; aching pain; they did an MRI; they offered her medicine for pain, and she took it but ran out; helped some; Dr. Manuella Ghazi thought it was the hip, but it's the knee  She does not want to take pills; she stopped the BP pill, cholesterol pill, even vitamin B12; she will take the Vitamin B12 (Rx sent); does take the aspirin, no bleeding  Anemia; she did get the scope done she says; low iron, last CBC; she did not go to the hematologist; had a bad flu that time; did not go; sick with a cold or something, very weak  AAA, PVD, managed by vascular specialist  Emphysema; noted on chest CT; does not like the inhalers; she does want to see a pulmonologist Lung cancer screening in March 2020 or April   Depression screen [facility] 2/9 12/29/2018 06/24/2018 06/15/2018 03/03/2018 12/16/2017  Decreased Interest 0 0 0 0 0  Down, Depressed, Hopeless 0 0 0 0 0  PHQ - 2 Score 0 0 0 0 0  Altered sleeping 0 - 0 - -  Tired, decreased energy 0 - 0 - -  Change in appetite 0 - 0 - -  Feeling  bad or failure about yourself  0 - 0 - -  Trouble concentrating 0 - 0 - -  Moving slowly or fidgety/restless 0 - 0 - -  Suicidal thoughts 0 - 0 - -  PHQ-9 Score 0 - 0 - -  Difficult doing work/chores Not difficult at all - Not difficult at all - -   Fall Risk  12/29/2018 09/24/2018 06/24/2018 06/15/2018 03/03/2018  Falls in the past year? 0 1 Yes Yes No  Number falls in past yr: 1 0 1 1 -  Injury with Fall? 0 0 No No -  Risk Factor Category  - - - - -  Risk for fall due to : - - - Impaired vision;Impaired balance/gait;History of fall(s);Other (Comment);Mental status change -  Risk for fall due to: Comment - - - wears eyeglasses; blind L eye; cataract L eye; needs w/c to get around; ambulates when needed with cane or walker -  Follow up - - - Falls evaluation completed;Education provided;Falls prevention discussed -    Relevant past medical, surgical, family and social history reviewed Past Medical History:  Diagnosis Date  . Dyslipidemia   .  History of stroke with residual effects    Stroke in April 2016.  Marland Kitchen Hypertension   . Legally blind in right eye, as defined in [place]   . Peripheral vascular disease ([place])   . Stroke [facility])    Past Surgical History:  Procedure Laterality Date  . PERIPHERAL ARTERIAL STENT GRAFT Left 2010   Family History  Problem Relation Age of Onset  . Diabetes Sister   . Cancer Sister        breast  . Alzheimer's disease Mother   . Dementia Mother   . AAA (abdominal aortic aneurysm) Father   . Stroke Brother   . Hyperlipidemia Sister   . Hypertension Sister   . Stroke Brother   . Hypertension Brother    Social History   Tobacco Use  . Smoking status: Current Every Day Smoker    Packs/day: 0.25    Years: 62.00    Pack years: 15.50    Types: Cigarettes    Start date: 11/18/1955  . Smokeless tobacco: Never Used  . Tobacco comment: Down to about 1/2 pack a day from 2ppd 03/03/2018  Substance Use Topics  . Alcohol use: No    Alcohol/week: 0.0 standard  drinks  . Drug use: No     Office Visit from 12/29/2018 in [facility]  AUDIT-C Score  0      Interim medical history since last visit reviewed. Allergies and medications reviewed  Review of Systems  Respiratory: Positive for cough (just a little head cold she says).   Gastrointestinal: Negative for blood in stool.  Genitourinary: Negative for hematuria.   Per HPI unless specifically indicated above     Objective:    BP 120/64   Pulse 99   Temp 98.4 F (36.9 C)   Ht 5\' 5"  (1.651 m)   Wt 87 lb 1.6 oz (39.5 kg)   SpO2 94%   BMI 14.49 kg/m   Wt Readings from Last 3 Encounters:  12/30/18 87 lb 6.4 oz (39.6 kg)  12/29/18 87 lb 1.6 oz (39.5 kg)  10/27/18 89 lb 12.8 oz (40.7 kg)    Physical Exam Constitutional:      General: She is not in acute distress.    Appearance: She is well-developed.     Comments: Very thin, underweight, appears cachectic, extremities with very little muscle mass  HENT:     Nose: No rhinorrhea.     Mouth/Throat:     Mouth: Mucous membranes are moist.  Eyes:     General: No scleral icterus. Cardiovascular:     Rate and Rhythm: Normal rate and regular rhythm.  Pulmonary:     Effort: Pulmonary effort is normal.     Breath sounds: Normal breath sounds.  Abdominal:     General: Bowel sounds are normal. There is no distension.  Musculoskeletal:     Comments: No visible deformity of swelling of the left knee; no effusion; does not feel warmer to the touch; no distal edema  Skin:    General: Skin is warm.  Neurological:     Mental Status: She is alert.     Comments: Gait not assessed, seated in wheelchair  Psychiatric:        Mood and Affect: Mood is not anxious or depressed.        Behavior: Behavior normal.     Last labs reviewed, RBC 2.92 (L), Hgb 8.1 (L), Hct 25.3 (L), MCV normal 86.6, MCH normal 27.7     Assessment &  Plan:   Problem List Items Addressed This Visit      Cardiovascular and Mediastinum    Peripheral vascular disease ([place])    Monitored by vascular      Hypertension    controlled      Dilatation of aorta ([place])    Monitored by vascular      Atherosclerosis of abdominal aorta ([place])    Monitored by vascular; patient does not want to take statin      AAA (abdominal aortic aneurysm) without rupture (H[facility])    Monitored by vascular        Respiratory   Bullous emphysema ([place]) - Primary    Unfortunately, patient continues to smoke; urged her to quit smoking; she is so close      Relevant Orders   Ambulatory referral to Pulmonology     Musculoskeletal and Integument   Osteoporosis    Patient does not want DEXA scan; fall precautions urged      Relevant Orders   VITAMIN D 25 Hydroxy (Vit-D Deficiency, Fractures) (Completed)   TSH (Completed)     Other   Hyperglycemia   Vitamin B12 deficiency   Relevant Orders   Vitamin B12 (Completed)   Tobacco abuse   Dyslipidemia   Protein-calorie malnutrition, severe    Very underweight; encouraged boost or ensure or other supplementation; talked about keeping healthy food beside her so that lack of wanting to get up wouldn't prevent him from nibbling on something when thinking about food      Relevant Orders   VITAMIN D 25 Hydroxy (Vit-D Deficiency, Fractures) (Completed)   TSH (Completed)   Left leg pain    Refer back to orthopaedics      Anemia    Check labs again; patient reports having been worked up already, but I don't see that she finished evaluation      Relevant Medications   Cyanocobalamin (VITAMIN B-12) 500 MCG SUBL   Other Relevant Orders   CBC (Completed)   Iron, TIBC and Ferritin Panel (Completed)   Ambulatory referral to Hematology / Oncology   CBC (Completed)   Iron, TIBC and Ferritin Panel (Completed)    Other Visit Diagnoses    Atherosclerosis of aorta ([place])       Relevant Orders   Lipid panel (Completed)   Underweight       Relevant Orders   COMPLETE METABOLIC PANEL WITH GFR  (Completed)   VITAMIN D 25 Hydroxy (Vit-D Deficiency, Fractures) (Completed)   TSH (Completed)   Medication monitoring encounter       Chronic pain of left lower extremity       Relevant Orders   Ambulatory referral to Orthopedic Surgery       Follow up plan: No follow-ups on file.  An after-visit summary was printed and given to the patient at [place].  Please see the patient instructions which may contain other information and recommendations beyond what is mentioned above in the assessment and plan.  Meds ordered this encounter  Medications  . Cyanocobalamin (VITAMIN B-12) 500 MCG SUBL    Sig: One tablet under the tongue, dissolved; once a day    Dispense:  30 tablet    Refill:  12    Orders Placed This Encounter  Procedures  . CBC  . Iron, TIBC and Ferritin Panel  . Lipid panel  . COMPLETE METABOLIC PANEL WITH GFR  . Vitamin B12  . VITAMIN D 25 Hydroxy (Vit-D Deficiency, Fractures)  . TSH  . Ambulatory referral  to Pulmonology  . Ambulatory referral to Orthopedic Surgery  . Ambulatory referral to Hematology / Oncology

## 2018-12-29 NOTE — Patient Instructions (Addendum)
I do encourage you to quit smoking Call [phone] to sign up for smoking cessation classes You can call 1-800-QUIT-NOW to talk with a smoking cessation coach  We'll have you see the orthopaedist as soon as possible We'll have you see the hematologist We'll get labs today If you have not heard anything from my staff in a week about any orders/referrals/studies from today, please contact us here to follow-up [phone]  Do try to get a nutritional supplement Have healthy snacks where you can reach them easily   Fall Prevention in the Home, Adult Falls can cause injuries. They can happen to people of all ages. There are many things you can do to make your home safe and to help prevent falls. Ask for help when making these changes, if needed. What actions can I take to prevent falls? General Instructions  Use good lighting in all rooms. Replace any light bulbs that burn out.  Turn on the lights when you go into a dark area. Use night-lights.  Keep items that you use often in easy-to-reach places. Lower the shelves around your home if necessary.  Set up your furniture so you have a clear path. Avoid moving your furniture around.  Do not have throw rugs and other things on the floor that can make you trip.  Avoid walking on wet floors.  If any of your floors are uneven, fix them.  Add color or contrast paint or tape to clearly mark and help you see: ? Any grab bars or handrails. ? First and last steps of stairways. ? Where the edge of each step is.  If you use a stepladder: ? Make sure that it is fully opened. Do not climb a closed stepladder. ? Make sure that both sides of the stepladder are locked into place. ? Ask someone to hold the stepladder for you while you use it.  If there are any pets around you, be aware of where they are. What can I do in the bathroom?      Keep the floor dry. Clean up any water that spills onto the floor as soon as it happens.  Remove  soap buildup in the tub or shower regularly.  Use non-skid mats or decals on the floor of the tub or shower.  Attach bath mats securely with double-sided, non-slip rug tape.  If you need to sit down in the shower, use a plastic, non-slip stool.  Install grab bars by the toilet and in the tub and shower. Do not use towel bars as grab bars. What can I do in the bedroom?  Make sure that you have a light by your bed that is easy to reach.  Do not use any sheets or blankets that are too big for your bed. They should not hang down onto the floor.  Have a firm chair that has side arms. You can use this for support while you get dressed. What can I do in the kitchen?  Clean up any spills right away.  If you need to reach something above you, use a strong step stool that has a grab bar.  Keep electrical cords out of the way.  Do not use floor polish or wax that makes floors slippery. If you must use wax, use non-skid floor wax. What can I do with my stairs?  Do not leave any items on the stairs.  Make sure that you have a light switch at the top of the stairs and the bottom of  the stairs. If you do not have them, ask someone to add them for you.  Make sure that there are handrails on both sides of the stairs, and use them. Fix handrails that are broken or loose. Make sure that handrails are as long as the stairways.  Install non-slip stair treads on all stairs in your home.  Avoid having throw rugs at the top or bottom of the stairs. If you do have throw rugs, attach them to the floor with carpet tape.  Choose a carpet that does not hide the edge of the steps on the stairway.  Check any carpeting to make sure that it is firmly attached to the stairs. Fix any carpet that is loose or worn. What can I do on the outside of my home?  Use bright outdoor lighting.  Regularly fix the edges of walkways and driveways and fix any cracks.  Remove anything that might make you trip as you walk  through a door, such as a raised step or threshold.  Trim any bushes or trees on the path to your home.  Regularly check to see if handrails are loose or broken. Make sure that both sides of any steps have handrails.  Install guardrails along the edges of any raised decks and porches.  Clear walking paths of anything that might make someone trip, such as tools or rocks.  Have any leaves, snow, or ice cleared regularly.  Use sand or salt on walking paths during winter.  Clean up any spills in your garage right away. This includes grease or oil spills. What other actions can I take?  Wear shoes that: ? Have a low heel. Do not wear high heels. ? Have rubber bottoms. ? Are comfortable and fit you well. ? Are closed at the toe. Do not wear open-toe sandals.  Use tools that help you move around (mobility aids) if they are needed. These include: ? Canes. ? Walkers. ? Scooters. ? Crutches.  Review your medicines with your doctor. Some medicines can make you feel dizzy. This can increase your chance of falling. Ask your doctor what other things you can do to help prevent falls. Where to find more information  Centers for Disease Control and Prevention, STEADI: https://garcia.biz/  [employer] on Aging: BrainJudge.co.uk Contact a doctor if:  You are afraid of falling at home.  You feel weak, drowsy, or dizzy at home.  You fall at home. Summary  There are many simple things that you can do to make your home safe and to help prevent falls.  Ways to make your home safe include removing tripping hazards and installing grab bars in the bathroom.  Ask for help when making these changes in your home. This information is not intended to replace advice given to you by your health care provider. Make sure you discuss any questions you have with your health care provider. Document Released: 08/30/2009 Document Revised: 06/18/2017 Document Reviewed: 06/18/2017 Elsevier  Interactive Patient Education  2019 [employer].

## 2018-12-30 ENCOUNTER — Other Ambulatory Visit: Payer: Self-pay

## 2018-12-30 ENCOUNTER — Other Ambulatory Visit: Payer: Self-pay | Admitting: Family Medicine

## 2018-12-30 ENCOUNTER — Encounter: Payer: Self-pay | Admitting: Emergency Medicine

## 2018-12-30 ENCOUNTER — Inpatient Hospital Stay
Admission: EM | Admit: 2018-12-30 | Discharge: 2018-12-31 | DRG: 378 | Disposition: A | Discharge status: Home / Self Care | Payer: Medicare Other | Attending: Internal Medicine | Admitting: Internal Medicine

## 2018-12-30 DIAGNOSIS — K449 Diaphragmatic hernia without obstruction or gangrene: Secondary | ICD-10-CM | POA: Diagnosis present

## 2018-12-30 DIAGNOSIS — K648 Other hemorrhoids: Secondary | ICD-10-CM | POA: Diagnosis present

## 2018-12-30 DIAGNOSIS — F17210 Nicotine dependence, cigarettes, uncomplicated: Secondary | ICD-10-CM | POA: Diagnosis present

## 2018-12-30 DIAGNOSIS — D62 Acute posthemorrhagic anemia: Secondary | ICD-10-CM | POA: Diagnosis present

## 2018-12-30 DIAGNOSIS — D509 Iron deficiency anemia, unspecified: Secondary | ICD-10-CM | POA: Diagnosis present

## 2018-12-30 DIAGNOSIS — E785 Hyperlipidemia, unspecified: Secondary | ICD-10-CM | POA: Diagnosis present

## 2018-12-30 DIAGNOSIS — K922 Gastrointestinal hemorrhage, unspecified: Secondary | ICD-10-CM

## 2018-12-30 DIAGNOSIS — I10 Essential (primary) hypertension: Secondary | ICD-10-CM | POA: Diagnosis present

## 2018-12-30 DIAGNOSIS — I739 Peripheral vascular disease, unspecified: Secondary | ICD-10-CM | POA: Diagnosis present

## 2018-12-30 DIAGNOSIS — I639 Cerebral infarction, unspecified: Secondary | ICD-10-CM | POA: Diagnosis not present

## 2018-12-30 DIAGNOSIS — D500 Iron deficiency anemia secondary to blood loss (chronic): Secondary | ICD-10-CM

## 2018-12-30 DIAGNOSIS — R001 Bradycardia, unspecified: Secondary | ICD-10-CM | POA: Diagnosis not present

## 2018-12-30 DIAGNOSIS — K625 Hemorrhage of anus and rectum: Principal | ICD-10-CM | POA: Diagnosis present

## 2018-12-30 DIAGNOSIS — Z823 Family history of stroke: Secondary | ICD-10-CM | POA: Diagnosis not present

## 2018-12-30 DIAGNOSIS — D649 Anemia, unspecified: Secondary | ICD-10-CM | POA: Diagnosis not present

## 2018-12-30 DIAGNOSIS — R58 Hemorrhage, not elsewhere classified: Secondary | ICD-10-CM | POA: Diagnosis not present

## 2018-12-30 DIAGNOSIS — K5790 Diverticulosis of intestine, part unspecified, without perforation or abscess without bleeding: Secondary | ICD-10-CM | POA: Diagnosis not present

## 2018-12-30 DIAGNOSIS — Z66 Do not resuscitate: Secondary | ICD-10-CM | POA: Diagnosis present

## 2018-12-30 DIAGNOSIS — Z8249 Family history of ischemic heart disease and other diseases of the circulatory system: Secondary | ICD-10-CM

## 2018-12-30 DIAGNOSIS — Z79899 Other long term (current) drug therapy: Secondary | ICD-10-CM | POA: Diagnosis not present

## 2018-12-30 DIAGNOSIS — Z7982 Long term (current) use of aspirin: Secondary | ICD-10-CM | POA: Diagnosis not present

## 2018-12-30 DIAGNOSIS — Z8673 Personal history of transient ischemic attack (TIA), and cerebral infarction without residual deficits: Secondary | ICD-10-CM | POA: Diagnosis not present

## 2018-12-30 DIAGNOSIS — J449 Chronic obstructive pulmonary disease, unspecified: Secondary | ICD-10-CM | POA: Diagnosis not present

## 2018-12-30 LAB — ABO/RH: ABO/RH(D): O POS

## 2018-12-30 LAB — COMPLETE METABOLIC PANEL WITH GFR
AG Ratio: 1.1 (calc) (ref 1.0–2.5)
ALT: 7 U/L (ref 6–29)
AST: 13 U/L (ref 10–35)
Albumin: 4 g/dL (ref 3.6–5.1)
Alkaline phosphatase (APISO): 77 U/L (ref 37–153)
BUN/Creatinine Ratio: 15 (calc) (ref 6–22)
BUN: 9 mg/dL (ref 7–25)
CALCIUM: 9.3 mg/dL (ref 8.6–10.4)
CO2: 23 mmol/L (ref 20–32)
Chloride: 108 mmol/L (ref 98–110)
Creat: 0.59 mg/dL — ABNORMAL LOW (ref 0.60–0.88)
GFR, EST NON AFRICAN AMERICAN: 87 mL/min/{1.73_m2} (ref >=60)
GFR, Est African American: 100 mL/min/{1.73_m2} (ref >=60)
Globulin: 3.6 g/dL (calc) (ref 1.9–3.7)
Glucose, Bld: 104 mg/dL — ABNORMAL HIGH (ref 65–99)
Potassium: 4.2 mmol/L (ref 3.5–5.3)
Sodium: 143 mmol/L (ref 135–146)
Total Bilirubin: 0.4 mg/dL (ref 0.2–1.2)
Total Protein: 7.6 g/dL (ref 6.1–8.1)

## 2018-12-30 LAB — LIPID PANEL
Cholesterol: 144 mg/dL (ref <200)
HDL: 63 mg/dL (ref >=50)
LDL Cholesterol (Calc): 70 mg/dL (calc)
Non-HDL Cholesterol (Calc): 81 mg/dL (calc) (ref <130)
Total CHOL/HDL Ratio: 2.3 (calc) (ref <5.0)
Triglycerides: 40 mg/dL (ref <150)

## 2018-12-30 LAB — COMPREHENSIVE METABOLIC PANEL
ALT: 9 U/L (ref 0–44)
AST: 18 U/L (ref 15–41)
Albumin: 3.6 g/dL (ref 3.5–5.0)
Alkaline Phosphatase: 61 U/L (ref 38–126)
Anion gap: 7 (ref 5–15)
BUN: 9 mg/dL (ref 8–23)
CO2: 23 mmol/L (ref 22–32)
Calcium: 8.8 mg/dL — ABNORMAL LOW (ref 8.9–10.3)
Chloride: 107 mmol/L (ref 98–111)
Creatinine, Ser: 0.56 mg/dL (ref 0.44–1.00)
GFR calc non Af Amer: >60 mL/min (ref >60)
Glucose, Bld: 99 mg/dL (ref 70–99)
Potassium: 3.8 mmol/L (ref 3.5–5.1)
Sodium: 137 mmol/L (ref 135–145)
Total Bilirubin: 0.5 mg/dL (ref 0.3–1.2)
Total Protein: 7.5 g/dL (ref 6.5–8.1)

## 2018-12-30 LAB — CBC
HCT: 25 % — ABNORMAL LOW (ref 36.0–46.0)
HEMATOCRIT: 23.8 % — AB (ref 35.0–45.0)
HEMOGLOBIN: 6.7 g/dL — AB (ref 11.7–15.5)
Hemoglobin: 6.7 g/dL — ABNORMAL LOW (ref 12.0–15.0)
MCH: 20.1 pg — AB (ref 27.0–33.0)
MCH: 19.9 pg — ABNORMAL LOW (ref 26.0–34.0)
MCHC: 28.2 g/dL — ABNORMAL LOW (ref 32.0–36.0)
MCHC: 26.8 g/dL — ABNORMAL LOW (ref 30.0–36.0)
MCV: 71.5 fL — AB (ref 80.0–100.0)
MCV: 74.4 fL — ABNORMAL LOW (ref 80.0–100.0)
MPV: 9.7 fL (ref 7.5–12.5)
PLATELETS: 458 10*3/uL — AB (ref 150–400)
Platelets: 519 10*3/uL — ABNORMAL HIGH (ref 140–400)
RBC: 3.33 10*6/uL — AB (ref 3.80–5.10)
RBC: 3.36 MIL/uL — ABNORMAL LOW (ref 3.87–5.11)
RDW: 17.8 % — ABNORMAL HIGH (ref 11.0–15.0)
RDW: 19.6 % — ABNORMAL HIGH (ref 11.5–15.5)
WBC: 9.1 10*3/uL (ref 3.8–10.8)
WBC: 9.8 10*3/uL (ref 4.0–10.5)
nRBC: 0.2 % (ref 0.0–0.2)

## 2018-12-30 LAB — IRON,TIBC AND FERRITIN PANEL
%SAT: 3 % (calc) — ABNORMAL LOW (ref 16–45)
FERRITIN: 7 ng/mL — AB (ref 16–288)
IRON: 16 ug/dL — AB (ref 45–160)
TIBC: 500 mcg/dL (calc) — ABNORMAL HIGH (ref 250–450)

## 2018-12-30 LAB — PREPARE RBC (CROSSMATCH)

## 2018-12-30 LAB — VITAMIN B12: Vitamin B-12: 425 pg/mL (ref 200–1100)

## 2018-12-30 LAB — PROTIME-INR
INR: 1
Prothrombin Time: 13.1 seconds (ref 11.4–15.2)

## 2018-12-30 LAB — VITAMIN D 25 HYDROXY (VIT D DEFICIENCY, FRACTURES): VIT D 25 HYDROXY: 5 ng/mL — AB (ref 30–100)

## 2018-12-30 LAB — TSH: TSH: 1.19 mIU/L (ref 0.40–4.50)

## 2018-12-30 MED ORDER — SODIUM CHLORIDE 0.9 % IV SOLN
80.0000 mg | Freq: Once | INTRAVENOUS | Status: AC
  Administered 2018-12-30: 80 mg via INTRAVENOUS
  Filled 2018-12-30: qty 80

## 2018-12-30 MED ORDER — POLYETHYLENE GLYCOL 3350 17 GM/SCOOP PO POWD
1.0000 | Freq: Once | ORAL | Status: AC
  Administered 2018-12-30: 255 g via ORAL
  Filled 2018-12-30: qty 255

## 2018-12-30 MED ORDER — INFLUENZA VAC SPLIT HIGH-DOSE 0.5 ML IM SUSY
0.5000 mL | PREFILLED_SYRINGE | INTRAMUSCULAR | Status: DC
  Filled 2018-12-30: qty 0.5

## 2018-12-30 MED ORDER — PANTOPRAZOLE SODIUM 40 MG IV SOLR: 40.0000 mg | Freq: Two times a day (BID) | INTRAVENOUS | Status: DC

## 2018-12-30 MED ORDER — SODIUM CHLORIDE 0.9 % IV SOLN
8.0000 mg/h | INTRAVENOUS | Status: DC
  Filled 2018-12-30: qty 80

## 2018-12-30 MED ORDER — ACETAMINOPHEN 325 MG PO TABS: 650.0000 mg | ORAL_TABLET | Freq: Four times a day (QID) | ORAL | Status: DC | PRN

## 2018-12-30 MED ORDER — ACETAMINOPHEN 650 MG RE SUPP: 650.0000 mg | Freq: Four times a day (QID) | RECTAL | Status: DC | PRN

## 2018-12-30 MED ORDER — SODIUM CHLORIDE 0.9 % IV SOLN
10.0000 mL/h | Freq: Once | INTRAVENOUS | Status: AC
  Administered 2018-12-30: 10 mL/h via INTRAVENOUS

## 2018-12-30 MED ORDER — SODIUM CHLORIDE 0.9% FLUSH
3.0000 mL | Freq: Two times a day (BID) | INTRAVENOUS | Status: DC
  Administered 2018-12-31: 10:00:00 3 mL via INTRAVENOUS

## 2018-12-30 MED ORDER — VITAMIN D (ERGOCALCIFEROL) 1.25 MG (50000 UNIT) PO CAPS: 50000.0000 [IU] | ORAL_CAPSULE | ORAL | 2 refills | Status: AC

## 2018-12-30 MED ORDER — ONDANSETRON HCL 4 MG PO TABS: 4.0000 mg | ORAL_TABLET | Freq: Four times a day (QID) | ORAL | Status: DC | PRN

## 2018-12-30 MED ORDER — ONDANSETRON HCL 4 MG/2ML IJ SOLN: 4.0000 mg | Freq: Four times a day (QID) | INTRAMUSCULAR | Status: DC | PRN

## 2018-12-30 MED ORDER — ALBUTEROL SULFATE (2.5 MG/3ML) 0.083% IN NEBU: 2.5000 mg | INHALATION_SOLUTION | RESPIRATORY_TRACT | Status: DC | PRN

## 2018-12-30 MED ORDER — POLYETHYLENE GLYCOL 3350 17 G PO PACK: 17.0000 g | PACK | Freq: Every day | ORAL | Status: DC | PRN

## 2018-12-30 NOTE — Assessment & Plan Note (Signed)
Patient does not want DEXA scan; fall precautions urged

## 2018-12-30 NOTE — Assessment & Plan Note (Signed)
controlled 

## 2018-12-30 NOTE — Assessment & Plan Note (Signed)
Monitored by vascular; patient does not want to take statin

## 2018-12-30 NOTE — Progress Notes (Signed)
Sending now to ER for microcytic hypochromic anemia suggestive of chronic blood loss Routing labs to GI colleagues who will likely be contacted for EGD

## 2018-12-30 NOTE — Assessment & Plan Note (Signed)
Monitored by vascular

## 2018-12-30 NOTE — Progress Notes (Signed)
Advance care planning  Purpose of Encounter Rectal bleed, CODE STATUS discussion  Parties in Attendance Patient, sister-in-law at bedside  Patients Decisional capacity Patient is alert and oriented.  Able to make medical decisions.  She tells me her sister and eldest son(Henry Cletus Gash) are the joint healthcare power of attorney's.  Discussed regarding rectal bleed, admission, need for further work-up and blood transfusions.  Treatment plan progresses discussed.  We discussed regarding CODE STATUS.  Patient has no ACP documents.  She wishes to be a full code.  Orders entered.  CODE STATUS changed.  Full code  Time spent - 17 minutes

## 2018-12-30 NOTE — Assessment & Plan Note (Signed)
Unfortunately, patient continues to smoke; urged her to quit smoking; she is so close

## 2018-12-30 NOTE — Consult Note (Signed)
Kaitlyn Lame, MD [facility]  [address] [place], [place] [address] Phone: [phone] Fax : [phone]  Consultation  Referring Provider:     Dr. Darvin Neighbours  Primary Care Physician:  Arnetha Courser, MD Primary Gastroenterologist:  Dr. Marius Ditch         Reason for Consultation:     Anemia with rectal bleeding  Date of Admission:  12/30/2018 Date of Consultation:  12/30/2018         HPI:   Kaitlyn Gonzalez is a 83 y.o. female Who comes in today with a history of rectal bleeding.  The patient was seen previously in the hospital and was recommended to undergo colonoscopy.  The patient and her family states the patient was unable to drink the prep and did not follow-up with the colonoscopy.  She was now admitted with a hemoglobin of 6.7 yesterday and it was stable at 6.7 today. 6 months ago the patient's hemoglobin was 9.2. In May 2017 patient's hemoglobin was normal at 12.0.   The patient has had a significant amount of weight loss although she states she has never been a big eater.  The patient was seen by her primary care physician 1 days ago and blood work showed her to be anemic therefore she was told, to the hospital for blood transfusion.  The patient denies any abdominal pain or change in bowel habits.  Past Medical History:  Diagnosis Date  . Dyslipidemia   . History of stroke with residual effects    Stroke in April 2016.  Marland Kitchen Hypertension   . Legally blind in right eye, as defined in [place]   . Peripheral vascular disease ([place])   . Stroke [facility])     Past Surgical History:  Procedure Laterality Date  . PERIPHERAL ARTERIAL STENT GRAFT Left 2010    Prior to Admission medications   Medication Sig Start Date End Date Taking? Authorizing Provider  aspirin EC 81 MG tablet Take 81 mg by mouth daily.   Yes [provider]  Aspirin-Salicylamide-Caffeine (BC HEADACHE) 325-95-16 MG TABS Take 1 packet by mouth as directed.   Yes [provider]  Cyanocobalamin  (VITAMIN B-12) 500 MCG SUBL One tablet under the tongue, dissolved; once a day 12/29/18   Lada, Satira Anis, MD  Vitamin D, Ergocalciferol, (DRISDOL) 1.25 MG (50000 UT) CAPS capsule Take 1 capsule (50,000 Units total) by mouth every 7 (seven) days. 12/30/18   Arnetha Courser, MD    Family History  Problem Relation Age of Onset  . Diabetes Sister   . Cancer Sister        breast  . Alzheimer's disease Mother   . Dementia Mother   . AAA (abdominal aortic aneurysm) Father   . Stroke Brother   . Hyperlipidemia Sister   . Hypertension Sister   . Stroke Brother   . Hypertension Brother      Social History   Tobacco Use  . Smoking status: Current Every Day Smoker    Packs/day: 0.25    Years: 62.00    Pack years: 15.50    Types: Cigarettes    Start date: 11/18/1955  . Smokeless tobacco: Never Used  . Tobacco comment: Down to about 1/2 pack a day from 2ppd 03/03/2018  Substance Use Topics  . Alcohol use: No    Alcohol/week: 0.0 standard drinks  . Drug use: No    Allergies as of 12/30/2018  . (No Known Allergies)    Review of Systems:  All systems reviewed and negative except where noted in HPI.   Physical Exam:  Vital signs in last 24 hours: Temp:  [97.9 F (36.6 C)-98.8 F (37.1 C)] 98.8 F (37.1 C) (02/13 1615) Pulse Rate:  [77-89] 82 (02/13 1615) Resp:  [16-18] 18 (02/13 1615) BP: (114-153)/(56-88) 147/83 (02/13 1615) SpO2:  [94 %-100 %] 100 % (02/13 1615) Weight:  [39.6 kg-40 kg] 39.6 kg (02/13 1433) Last BM Date: 12/30/18 General:   Pleasant, cooperative in NAD Head:  Normocephalic and atraumatic. Eyes:   No icterus.   Conjunctiva pink. The patient has completely lipase that he of the right eye. Ears:  Normal auditory acuity. Neck:  Supple; no masses or thyroidomegaly Lungs: Respirations even and unlabored. Lungs clear to auscultation bilaterally.   No wheezes, crackles, or rhonchi.  Heart:  Regular rate and rhythm;  Without murmur, clicks, rubs or gallops Abdomen:   Soft, nondistended, nontender. Normal bowel sounds. No appreciable masses or hepatomegaly.  No rebound or guarding.  Rectal:  Not performed. Msk:  Symmetrical without gross deformities.    Extremities:  Without edema, cyanosis or clubbing. Neurologic:  Alert and oriented x3;  grossly normal neurologically. Skin:  Intact without significant lesions or rashes. Cervical Nodes:  No significant cervical adenopathy. Psych:  Alert and cooperative. Normal affect.  LAB RESULTS: Recent Labs    12/29/18 1201 12/30/18 0938  WBC 9.1 9.8  HGB 6.7* 6.7*  HCT 23.8* 25.0*  PLT 519* 458*   BMET Recent Labs    12/29/18 1201 12/30/18 0938  NA 143 137  K 4.2 3.8  CL 108 107  CO2 23 23  GLUCOSE 104* 99  BUN 9 9  CREATININE 0.59* 0.56  CALCIUM 9.3 8.8*   LFT Recent Labs    12/30/18 0938  PROT 7.5  ALBUMIN 3.6  AST 18  ALT 9  ALKPHOS 61  BILITOT 0.5   PT/INR Recent Labs    12/30/18 0938  LABPROT 13.1  INR 1.00    STUDIES: No results found.    Impression / Plan:   Assessment: Active Problems:   Rectal bleeding   Kaitlyn Gonzalez is a 83 y.o. y/o female with Failure to thrive and chronic anemia with rectal bleeding.  The patient was supposed to have a colonoscopy in the past but had a prominent preps and never followed up with it.  The patient has never had a colonoscopy despite being recommended at her last visit to undergo a colonoscopy.   Plan:  The patient will be set up for an EGD and colonoscopy for tomorrow.  The patient will be given a MiraLAX prep that may be better tolerated.  The patient has been explained the plan and agrees with it.  She has also been told that she will need to finish the prep so we can have a good look around and see why she may be anemic.  I discussed with the patient's sister about the possibility of a family member coming into encouraged the patient to drink the prep was told about would be possible.  Thank you for involving me in the  care of this patient.      LOS: 0 days   Kaitlyn Lame, MD  12/30/2018, 6:27 PM    Note: This dictation was prepared with Dragon dictation along with smaller phrase technology. Any transcriptional errors that result from this process are unintentional.

## 2018-12-30 NOTE — Assessment & Plan Note (Signed)
Very underweight; encouraged boost or ensure or other supplementation; talked about keeping healthy food beside her so that lack of wanting to get up wouldn't prevent him from nibbling on something when thinking about food

## 2018-12-30 NOTE — H&P (Signed)
[place] [facility] [place] NAME: Kaitlyn Gonzalez    MR#:  [id number]  DATE OF BIRTH:  1936/03/15  DATE OF ADMISSION:  12/30/2018  PRIMARY CARE PHYSICIAN: Arnetha Courser, MD   REQUESTING/REFERRING PHYSICIAN: Dr. Alfred Levins  CHIEF COMPLAINT:   Chief Complaint  Patient presents with  . Rectal Bleeding    HISTORY OF PRESENT ILLNESS:  Kaitlyn Gonzalez  is a 83 y.o. female with a known history of hypertension, CVA presents to the emergency room sent in by her primary care physician due to 2 days of rectal bleeding.  Hemoglobin found to be 6.7.  Vitals are stable at this time.  No abdominal pain.  Has lost significant amount of weight according to patient and family at bedside.  Poor appetite.  She does take aspirin daily.  No NSAIDs.  No vomiting.  Hemoglobin was checked as outpatient yesterday and was 6.7.  It is 6.7 again today.  But microcytic.  No prior history of blood in stool.  PAST MEDICAL HISTORY:   Past Medical History:  Diagnosis Date  . Dyslipidemia   . History of stroke with residual effects    Stroke in April 2016.  Marland Kitchen Hypertension   . Legally blind in right eye, as defined in [place]   . Peripheral vascular disease ([place])   . Stroke [facility])     PAST SURGICAL HISTORY:   Past Surgical History:  Procedure Laterality Date  . PERIPHERAL ARTERIAL STENT GRAFT Left 2010    SOCIAL HISTORY:   Social History   Tobacco Use  . Smoking status: Current Every Day Smoker    Packs/day: 0.25    Years: 62.00    Pack years: 15.50    Types: Cigarettes    Start date: 11/18/1955  . Smokeless tobacco: Never Used  . Tobacco comment: Down to about 1/2 pack a day from 2ppd 03/03/2018  Substance Use Topics  . Alcohol use: No    Alcohol/week: 0.0 standard drinks    FAMILY HISTORY:   Family History  Problem Relation Age of Onset  . Diabetes Sister   . Cancer Sister        breast  . Alzheimer's disease Mother   . Dementia Mother   . AAA (abdominal aortic  aneurysm) Father   . Stroke Brother   . Hyperlipidemia Sister   . Hypertension Sister   . Stroke Brother   . Hypertension Brother     DRUG ALLERGIES:  No Known Allergies  REVIEW OF SYSTEMS:   Review of Systems  Constitutional: Positive for malaise/fatigue. Negative for chills and fever.  HENT: Negative for sore throat.   Eyes: Negative for blurred vision, double vision and pain.  Respiratory: Negative for cough, hemoptysis, shortness of breath and wheezing.   Cardiovascular: Negative for chest pain, palpitations, orthopnea and leg swelling.  Gastrointestinal: Positive for blood in stool. Negative for abdominal pain, constipation, diarrhea, heartburn, nausea and vomiting.  Genitourinary: Negative for dysuria and hematuria.  Musculoskeletal: Negative for back pain and joint pain.  Skin: Negative for rash.  Neurological: Negative for sensory change, speech change, focal weakness and headaches.  Endo/Heme/Allergies: Does not bruise/bleed easily.  Psychiatric/Behavioral: Negative for depression. The patient is not nervous/anxious.     MEDICATIONS AT HOME:   Prior to Admission medications   Medication Sig Start Date End Date Taking? Authorizing Provider  aspirin EC 81 MG tablet Take 81 mg by mouth daily.   Yes [provider]  Aspirin-Salicylamide-Caffeine ([address]) [phone]  MG TABS Take 1 packet by mouth as directed.   Yes [provider]  Cyanocobalamin (VITAMIN B-12) 500 MCG SUBL One tablet under the tongue, dissolved; once a day 12/29/18   Lada, Satira Anis, MD  Vitamin D, Ergocalciferol, (DRISDOL) 1.25 MG (50000 UT) CAPS capsule Take 1 capsule (50,000 Units total) by mouth every 7 (seven) days. 12/30/18   Arnetha Courser, MD     VITAL SIGNS:  Blood pressure 134/69, pulse 77, temperature 98.1 F (36.7 C), temperature source Oral, resp. rate 16, height 5\' 5"  (1.651 m), weight 40 kg, SpO2 100 %.  PHYSICAL EXAMINATION:  Physical Exam  GENERAL:  83  y.o.-year-old patient lying in the bed with no acute distress.  EYES: Pupils equal, round, reactive to light and accommodation. No scleral icterus. Extraocular muscles intact.  HEENT: Head atraumatic, normocephalic. Oropharynx and nasopharynx clear. No oropharyngeal erythema, moist oral mucosa.pallor present NECK:  Supple, no jugular venous distention. No thyroid enlargement, no tenderness.  LUNGS: Normal breath sounds bilaterally, no wheezing, rales, rhonchi. No use of accessory muscles of respiration.  CARDIOVASCULAR: S1, S2 normal. No murmurs, rubs, or gallops.  ABDOMEN: Soft, nontender, nondistended. Bowel sounds present. No organomegaly or mass.  EXTREMITIES: No pedal edema, cyanosis, or clubbing. + 2 pedal & radial pulses b/l.  Diffuse muscle wasting NEUROLOGIC: Cranial nerves II through XII are intact. No focal Motor or sensory deficits appreciated b/l PSYCHIATRIC: The patient is alert and oriented x 3. Good affect.  SKIN: No obvious rash, lesion, or ulcer.   LABORATORY PANEL:   CBC Recent Labs  Lab 12/30/18 0938  WBC 9.8  HGB 6.7*  HCT 25.0*  PLT 458*   ------------------------------------------------------------------------------------------------------------------  Chemistries  Recent Labs  Lab 12/30/18 0938  NA 137  K 3.8  CL 107  CO2 23  GLUCOSE 99  BUN 9  CREATININE 0.56  CALCIUM 8.8*  AST 18  ALT 9  ALKPHOS 61  BILITOT 0.5   ------------------------------------------------------------------------------------------------------------------  Cardiac Enzymes No results for input(s): TROPONINI in the last 168 hours. ------------------------------------------------------------------------------------------------------------------  RADIOLOGY:  No results found.   IMPRESSION AND PLAN:   *Rectal bleeding with acute blood loss anemia.  She also seems to have competent of chronic blood loss.  Likely diverticular.  Hold aspirin.  Clear liquid diet.  Discussed  with Dr. Verl Blalock of GI who will see the patient.  Transfused 2 units of packed RBC.  *History of CVA.  Hold aspirin due to rectal bleed.  *Hypertension.  Not on any medications at home.  Blood pressure stable.  Will monitor.  *Weight loss with poor appetite.  Will need outpatient work-up.  *DVT prophylaxis with SCDs  All the records are reviewed and case discussed with ED provider. Management plans discussed with the patient, family and they are in agreement.  CODE STATUS: Full code  TOTAL TIME TAKING CARE OF THIS PATIENT: 40 minutes.   Leia Alf Lonette Stevison M.D on 12/30/2018 at 12:11 PM  Between 7am to 6pm - Pager - [phone]  After 6pm go to [email or web address] - password [email or web address] [facility]  Office  [phone]  CC: Primary care physician; Arnetha Courser, MD  Note: This dictation was prepared with Dragon dictation along with smaller phrase technology. Any transcriptional errors that result from this process are unintentional.

## 2018-12-30 NOTE — ED Notes (Signed)
VS done at 1145

## 2018-12-30 NOTE — ED Triage Notes (Signed)
Patient from home via [facility]. Patient reports bright red blood in stool x2 days. Patient was seen at PCP yesterday and states she was called today and told to come to ED due to low blood counts. Patient denies any pain in rectum or abdomen. Denies N/V.

## 2018-12-30 NOTE — Assessment & Plan Note (Signed)
Refer back to orthopaedics

## 2018-12-30 NOTE — Assessment & Plan Note (Addendum)
Check labs again; patient reports having been worked up already, but I don't see that she finished evaluation

## 2018-12-31 ENCOUNTER — Ambulatory Visit: Payer: Self-pay | Admitting: *Deleted

## 2018-12-31 ENCOUNTER — Inpatient Hospital Stay: Payer: Medicare Other | Admitting: Anesthesiology

## 2018-12-31 ENCOUNTER — Telehealth: Payer: Self-pay

## 2018-12-31 ENCOUNTER — Encounter
Admission: EM | Disposition: A | Discharge status: Home / Self Care | Payer: Self-pay | Source: Home / Self Care | Attending: Internal Medicine

## 2018-12-31 ENCOUNTER — Encounter: Payer: Self-pay | Admitting: Gastroenterology

## 2018-12-31 DIAGNOSIS — Z66 Do not resuscitate: Secondary | ICD-10-CM | POA: Diagnosis not present

## 2018-12-31 DIAGNOSIS — K625 Hemorrhage of anus and rectum: Secondary | ICD-10-CM | POA: Diagnosis not present

## 2018-12-31 DIAGNOSIS — I639 Cerebral infarction, unspecified: Secondary | ICD-10-CM | POA: Diagnosis not present

## 2018-12-31 DIAGNOSIS — D500 Iron deficiency anemia secondary to blood loss (chronic): Secondary | ICD-10-CM | POA: Diagnosis not present

## 2018-12-31 DIAGNOSIS — I739 Peripheral vascular disease, unspecified: Secondary | ICD-10-CM | POA: Diagnosis not present

## 2018-12-31 DIAGNOSIS — K5790 Diverticulosis of intestine, part unspecified, without perforation or abscess without bleeding: Secondary | ICD-10-CM | POA: Diagnosis not present

## 2018-12-31 DIAGNOSIS — K648 Other hemorrhoids: Secondary | ICD-10-CM | POA: Diagnosis not present

## 2018-12-31 DIAGNOSIS — K922 Gastrointestinal hemorrhage, unspecified: Secondary | ICD-10-CM | POA: Diagnosis not present

## 2018-12-31 DIAGNOSIS — E785 Hyperlipidemia, unspecified: Secondary | ICD-10-CM | POA: Diagnosis not present

## 2018-12-31 DIAGNOSIS — D649 Anemia, unspecified: Secondary | ICD-10-CM | POA: Diagnosis not present

## 2018-12-31 DIAGNOSIS — D62 Acute posthemorrhagic anemia: Secondary | ICD-10-CM | POA: Diagnosis not present

## 2018-12-31 DIAGNOSIS — J449 Chronic obstructive pulmonary disease, unspecified: Secondary | ICD-10-CM | POA: Diagnosis not present

## 2018-12-31 DIAGNOSIS — I10 Essential (primary) hypertension: Secondary | ICD-10-CM | POA: Diagnosis not present

## 2018-12-31 HISTORY — PX: ESOPHAGOGASTRODUODENOSCOPY (EGD) WITH PROPOFOL: SHX5813

## 2018-12-31 HISTORY — PX: COLONOSCOPY WITH PROPOFOL: SHX5780

## 2018-12-31 LAB — TYPE AND SCREEN
ABO/RH(D): O POS
Antibody Screen: NEGATIVE
Unit division: 0
Unit division: 0

## 2018-12-31 LAB — CBC
HCT: 31.7 % — ABNORMAL LOW (ref 36.0–46.0)
Hemoglobin: 9.7 g/dL — ABNORMAL LOW (ref 12.0–15.0)
MCH: 23.5 pg — ABNORMAL LOW (ref 26.0–34.0)
MCHC: 30.6 g/dL (ref 30.0–36.0)
MCV: 76.8 fL — ABNORMAL LOW (ref 80.0–100.0)
Platelets: 368 10*3/uL (ref 150–400)
RBC: 4.13 MIL/uL (ref 3.87–5.11)
RDW: 18.8 % — ABNORMAL HIGH (ref 11.5–15.5)
WBC: 8.4 10*3/uL (ref 4.0–10.5)
nRBC: 0.2 % (ref 0.0–0.2)

## 2018-12-31 LAB — BPAM RBC
Blood Product Expiration Date: 202002292359
Blood Product Expiration Date: 202003062359
ISSUE DATE / TIME: 202002131122
ISSUE DATE / TIME: 202002131557
UNIT TYPE AND RH: 5100
Unit Type and Rh: 5100

## 2018-12-31 LAB — BASIC METABOLIC PANEL
Anion gap: 6 (ref 5–15)
BUN: 7 mg/dL — ABNORMAL LOW (ref 8–23)
CALCIUM: 8.7 mg/dL — AB (ref 8.9–10.3)
CO2: 23 mmol/L (ref 22–32)
Chloride: 108 mmol/L (ref 98–111)
Creatinine, Ser: 0.48 mg/dL (ref 0.44–1.00)
GFR calc Af Amer: >60 mL/min (ref >60)
Glucose, Bld: 82 mg/dL (ref 70–99)
Potassium: 3.6 mmol/L (ref 3.5–5.1)
SODIUM: 137 mmol/L (ref 135–145)

## 2018-12-31 LAB — HEMOGLOBIN AND HEMATOCRIT, BLOOD
HCT: 34.8 % — ABNORMAL LOW (ref 36.0–46.0)
Hemoglobin: 10.6 g/dL — ABNORMAL LOW (ref 12.0–15.0)

## 2018-12-31 SURGERY — ESOPHAGOGASTRODUODENOSCOPY (EGD) WITH PROPOFOL
Anesthesia: General

## 2018-12-31 MED ORDER — SODIUM CHLORIDE 0.9 % IV SOLN
INTRAVENOUS | Status: DC
  Administered 2018-12-31: 1000 mL via INTRAVENOUS

## 2018-12-31 MED ORDER — FERROUS SULFATE 325 (65 FE) MG PO TBEC: 325.0000 mg | DELAYED_RELEASE_TABLET | Freq: Every day | ORAL | 0 refills | Status: DC

## 2018-12-31 MED ORDER — PROPOFOL 10 MG/ML IV BOLUS
INTRAVENOUS | Status: DC | PRN
  Administered 2018-12-31: 10 mg via INTRAVENOUS
  Administered 2018-12-31: 20 mg via INTRAVENOUS
  Administered 2018-12-31: 10 mg via INTRAVENOUS
  Administered 2018-12-31 (×2): 20 mg via INTRAVENOUS
  Administered 2018-12-31 (×2): 10 mg via INTRAVENOUS
  Administered 2018-12-31: 20 mg via INTRAVENOUS

## 2018-12-31 MED ORDER — DOCUSATE SODIUM 100 MG PO CAPS: 100.0000 mg | ORAL_CAPSULE | Freq: Every day | ORAL | 0 refills | Status: AC

## 2018-12-31 MED ORDER — ASPIRIN EC 81 MG PO TBEC: 81.0000 mg | DELAYED_RELEASE_TABLET | Freq: Every day | ORAL | Status: DC

## 2018-12-31 NOTE — Op Note (Signed)
[facility] Gastroenterology Patient Name: Kaitlyn Gonzalez Procedure Date: 12/31/2018 7:42 AM MRN: [id number] Account #: [id number] Date of Birth: 1936-05-19 Admit Type: Inpatient Age: 83 Room: [facility] ENDO ROOM 1 Gender: Female Note Status: Finalized Procedure:            Upper GI endoscopy Indications:          Iron deficiency anemia Providers:            Lucilla Lame MD, MD Medicines:            Propofol per Anesthesia Complications:        No immediate complications. Procedure:            Pre-Anesthesia Assessment:                       - Prior to the procedure, a History and Physical was                        performed, and patient medications and allergies were                        reviewed. The patient's tolerance of previous                        anesthesia was also reviewed. The risks and benefits of                        the procedure and the sedation options and risks were                        discussed with the patient. All questions were                        answered, and informed consent was obtained. Prior                        Anticoagulants: The patient has taken no previous                        anticoagulant or antiplatelet agents. ASA Grade                        Assessment: II - A patient with mild systemic disease.                        After reviewing the risks and benefits, the patient was                        deemed in satisfactory condition to undergo the                        procedure.                       After obtaining informed consent, the endoscope was                        passed under direct vision. Throughout the procedure,                        the patient's blood pressure,  pulse, and oxygen                        saturations were monitored continuously. The Endoscope                        was introduced through the mouth, and advanced to the                        second part of duodenum. The upper GI endoscopy was                         accomplished without difficulty. The patient tolerated                        the procedure well. Findings:      A small hiatal hernia was present.      A small diaphramatic hernia was found.      The examined duodenum was normal. Impression:           - Small hiatal hernia.                       - Small diaphramatic hernia.                       - Normal examined duodenum.                       - No specimens collected. Recommendation:       - Return patient to hospital ward for ongoing care.                       - Resume previous diet.                       - Continue present medications. Procedure Code(s):    --- Professional ---                       [phone], Esophagogastroduodenoscopy, flexible, transoral;                        diagnostic, including collection of specimen(s) by                        brushing or washing, when performed (separate procedure) Diagnosis Code(s):    --- Professional ---                       D50.9, Iron deficiency anemia, unspecified CPT copyright 2018 American Medical Association. All rights reserved. The codes documented in this report are preliminary and upon coder review may  be revised to meet current compliance requirements. Lucilla Lame MD, MD 12/31/2018 7:56:34 AM This report has been signed electronically. Number of Addenda: 0 Note Initiated On: 12/31/2018 7:42 AM      [facility]

## 2018-12-31 NOTE — Discharge Instructions (Signed)
Follow-up with primary care physician in 3 days Follow-up with gastroenterology in 10 days for video capsule endoscopy

## 2018-12-31 NOTE — Telephone Encounter (Signed)
I called in flow coordinator at [facility] and made her aware of the abnormal Hbg and that I had sent it over also.

## 2018-12-31 NOTE — Discharge Summary (Signed)
[place] at [place] NAME: Kaitlyn Gonzalez    MR#:  [id number]  DATE OF BIRTH:  11-Aug-1936  DATE OF ADMISSION:  12/30/2018 ADMITTING PHYSICIAN: Hillary Bow, MD  DATE OF DISCHARGE: 12/31/2018   PRIMARY CARE PHYSICIAN: Arnetha Courser, MD    ADMISSION DIAGNOSIS:  Lower GI bleed [K92.2]  DISCHARGE DIAGNOSIS:  Active Problems:   Rectal bleeding Status post EGD colonoscopy  SECONDARY DIAGNOSIS:   Past Medical History:  Diagnosis Date  . Dyslipidemia   . History of stroke with residual effects    Stroke in April 2016.  Marland Kitchen Hypertension   . Legally blind in right eye, as defined in [place]   . Peripheral vascular disease ([place])   . Stroke [facility])     HOSPITAL COURSE:  HPI  Kaitlyn Gonzalez  is a 83 y.o. female with a known history of hypertension, CVA presents to the emergency room sent in by her primary care physician due to 2 days of rectal bleeding.  Hemoglobin found to be 6.7.  Vitals are stable at this time.  No abdominal pain.  Has lost significant amount of weight according to patient and family at bedside.  Poor appetite.  She does take aspirin daily.  No NSAIDs.  No vomiting.  Hemoglobin was checked as outpatient yesterday and was 6.7.  It is 6.7 again today.  But microcytic.  No prior history of blood in stool.   *Rectal bleeding with acute blood loss anemia.    From hemorrhoids  Status post 2 units of blood transfusion  Hemoglobin 6.7 following transfusion 9.7-10.6 , no other episodes of bleeding  Hemodynamically stable  EGD and colonoscopy done on December 31, 2018 by Dr. Allen Norris, EGD was normal colonoscopy revealed hemorrhoids.  GI is recommending outpatient video capsule study in 1 to 2 weeks  Okay to resume aspirin at discharge patient from GI standpoint   *History of CVA.    Resumed aspirin continue the same  *Hypertension.  Not on any medications at home.  Blood pressure stable.  Low-salt diet  *Weight loss with poor  appetite.  Will need outpatient work-up.  PCP to follow  *DVT prophylaxis with SCDs  DISCHARGE CONDITIONS:   Stable  CONSULTS OBTAINED:     PROCEDURES EGD colonoscopy normal except hemorrhoids  DRUG ALLERGIES:  No Known Allergies  DISCHARGE MEDICATIONS:   Allergies as of 12/31/2018   No Known Allergies     Medication List    TAKE these medications   aspirin EC 81 MG tablet Take 81 mg by mouth daily.   BC HEADACHE 325-95-16 MG Tabs Generic drug:  Aspirin-Salicylamide-Caffeine Take 1 packet by mouth as directed.   docusate sodium 100 MG capsule Commonly known as:  COLACE Take 1 capsule (100 mg total) by mouth daily.   ferrous sulfate 325 (65 FE) MG EC tablet Take 1 tablet (325 mg total) by mouth daily with breakfast.   Vitamin B-12 500 MCG Subl One tablet under the tongue, dissolved; once a day   Vitamin D (Ergocalciferol) 1.25 MG (50000 UT) Caps capsule Commonly known as:  DRISDOL Take 1 capsule (50,000 Units total) by mouth every 7 (seven) days.        DISCHARGE INSTRUCTIONS:   Follow-up with primary care physician in 3 days Follow-up with gastroenterology in 10 days for video capsule endoscopy  DIET:  Cardiac diet  DISCHARGE CONDITION:  Stable  ACTIVITY:  Activity as tolerated  OXYGEN:  Home Oxygen: No.  Oxygen Delivery: room air  DISCHARGE LOCATION:  home   If you experience worsening of your admission symptoms, develop shortness of breath, life threatening emergency, suicidal or homicidal thoughts you must seek medical attention immediately by calling 911 or calling your MD immediately  if symptoms less severe.  You Must read complete instructions/literature along with all the possible adverse reactions/side effects for all the Medicines you take and that have been prescribed to you. Take any new Medicines after you have completely understood and accpet all the possible adverse reactions/side effects.   Please note  You were cared for  by a hospitalist during your hospital stay. If you have any questions about your discharge medications or the care you received while you were in the hospital after you are discharged, you can call the unit and asked to speak with the hospitalist on call if the hospitalist that took care of you is not available. Once you are discharged, your primary care physician will handle any further medical issues. Please note that NO REFILLS for any discharge medications will be authorized once you are discharged, as it is imperative that you return to your primary care physician (or establish a relationship with a primary care physician if you do not have one) for your aftercare needs so that they can reassess your need for medications and monitor your lab values.     Today  Chief Complaint  Patient presents with  . Rectal Bleeding    Patient denies any other episodes of bleeding denies any nausea or vomiting.  No abdominal pain tolerating diet and wants to go home ROS:  CONSTITUTIONAL: Denies fevers, chills. Denies any fatigue, weakness.  EYES: Denies blurry vision, double vision, eye pain. EARS, NOSE, THROAT: Denies tinnitus, ear pain, hearing loss. RESPIRATORY: Denies cough, wheeze, shortness of breath.  CARDIOVASCULAR: Denies chest pain, palpitations, edema.  GASTROINTESTINAL: Denies nausea, vomiting, diarrhea, abdominal pain. Denies bright red blood per rectum. GENITOURINARY: Denies dysuria, hematuria. ENDOCRINE: Denies nocturia or thyroid problems. HEMATOLOGIC AND LYMPHATIC: Denies easy bruising or bleeding. SKIN: Denies rash or lesion. MUSCULOSKELETAL: Denies pain in neck, back, shoulder, knees, hips or arthritic symptoms.  NEUROLOGIC: Denies paralysis, paresthesias.  PSYCHIATRIC: Denies anxiety or depressive symptoms.   VITAL SIGNS:  Blood pressure 137/68, pulse 75, temperature 98.4 F (36.9 C), temperature source Oral, resp. rate 18, height 5\' 5"  (1.651 m), weight 41.6 kg, SpO2 100  %.  I/O:    Intake/Output Summary (Last 24 hours) at 12/31/2018 1129 Last data filed at 12/31/2018 0813 Gross per 24 hour  Intake 1780.83 ml  Output 1100 ml  Net 680.83 ml    PHYSICAL EXAMINATION:  GENERAL:  83 y.o.-year-old patient lying in the bed with no acute distress.  EYES: Pupils equal, round, reactive to light and accommodation. No scleral icterus. Extraocular muscles intact.  HEENT: Head atraumatic, normocephalic. Oropharynx and nasopharynx clear.  NECK:  Supple, no jugular venous distention. No thyroid enlargement, no tenderness.  LUNGS: Normal breath sounds bilaterally, no wheezing, rales,rhonchi or crepitation. No use of accessory muscles of respiration.  CARDIOVASCULAR: S1, S2 normal. No murmurs, rubs, or gallops.  ABDOMEN: Soft, non-tender, non-distended. Bowel sounds present.  EXTREMITIES: No pedal edema, cyanosis, or clubbing.  NEUROLOGIC: Awake, alert and oriented x3 sensation intact. Gait not checked.  PSYCHIATRIC: The patient is alert and oriented x 3.  SKIN: No obvious rash, lesion, or ulcer.   DATA REVIEW:   CBC Recent Labs  Lab 12/31/18 0114 12/31/18 1004  WBC 8.4  --  HGB 9.7* 10.6*  HCT 31.7* 34.8*  PLT 368  --     Chemistries  Recent Labs  Lab 12/30/18 0938 12/31/18 0114  NA 137 137  K 3.8 3.6  CL 107 108  CO2 23 23  GLUCOSE 99 82  BUN 9 7*  CREATININE 0.56 0.48  CALCIUM 8.8* 8.7*  AST 18  --   ALT 9  --   ALKPHOS 61  --   BILITOT 0.5  --     Cardiac Enzymes No results for input(s): TROPONINI in the last 168 hours.  Microbiology Results  No results found for this or any previous visit.  RADIOLOGY:  No results found.  EKG:   Orders placed or performed during the hospital encounter of 06/22/17  . EKG 12-Lead  . EKG 12-Lead      Management plans discussed with the patient, family and they are in agreement.  CODE STATUS:     Code Status Orders  (From admission, onward)         Start     Ordered   12/30/18 1127   Full code  Continuous     12/30/18 1127        Code Status History    Date Active Date Inactive Code Status Order ID Comments User Context   06/24/2018 1606 12/30/2018 0931 DNR [id number] Patient notified doctor of wishes for no resuscitative efforts should need arise; witnessed by younger sister, Hoy Finlay, MD Outpatient   06/11/2018 1202 06/11/2018 1933 DNR [id number]  Bettey Costa, MD Inpatient   06/10/2018 1251 06/11/2018 1202 Full Code [id number]  Bettey Costa, MD Inpatient   06/09/2018 1641 06/10/2018 1251 Full Code [id number]  Fritzi Mandes, MD Inpatient      TOTAL TIME TAKING CARE OF THIS PATIENT: 43  minutes.   Note: This dictation was prepared with Dragon dictation along with smaller phrase technology. Any transcriptional errors that result from this process are unintentional.   @[facility] @  on 12/31/2018 at 11:29 AM  Between 7am to 6pm - Pager - [phone]  After 6pm go to [email or web address] - password [email or web address] [facility]  Office  [phone]  CC: Primary care physician; Arnetha Courser, MD

## 2018-12-31 NOTE — Telephone Encounter (Signed)
Please review

## 2018-12-31 NOTE — Telephone Encounter (Signed)
Attempted to reach patient for TCM call and confirm hospital follow up appt.   Left message to call me at [phone]

## 2018-12-31 NOTE — Progress Notes (Signed)
The patient had no sign of any GI bleeding in the upper endoscopy or colonoscopy.  The patient should undergo a capsule endoscopy at sometime in the future.  This can be done as an outpatient.  There is no sign of old blood seen in the GI tract.  The bright red blood per rectum was likely from hemorrhoids.

## 2018-12-31 NOTE — Transfer of Care (Signed)
Immediate Anesthesia Transfer of Care Note  Patient: Kaitlyn Gonzalez  Procedure(s) Performed: ESOPHAGOGASTRODUODENOSCOPY (EGD) WITH PROPOFOL (N/A ) COLONOSCOPY WITH PROPOFOL (N/A )  Patient Location: Endoscopy Unit  Anesthesia Type:General  Level of Consciousness: drowsy and responds to stimulation  Airway & Oxygen Therapy: Patient Spontanous Breathing and Patient connected to face mask oxygen  Post-op Assessment: Report given to RN and Post -op Vital signs reviewed and stable  Post vital signs: Reviewed and stable  Last Vitals:  Vitals Value Taken Time  BP 97/55 12/31/2018  8:18 AM  Temp 36.1 C 12/31/2018  8:16 AM  Pulse 77 12/31/2018  8:18 AM  Resp 14 12/31/2018  8:18 AM  SpO2 100 % 12/31/2018  8:18 AM    Last Pain:  Vitals:   12/31/18 0816  TempSrc: Tympanic  PainSc:          Complications: No apparent anesthesia complications

## 2018-12-31 NOTE — Anesthesia Postprocedure Evaluation (Signed)
Anesthesia Post Note  Patient: [name]  Procedure(s) Performed: ESOPHAGOGASTRODUODENOSCOPY (EGD) WITH PROPOFOL (N/A ) COLONOSCOPY WITH PROPOFOL (N/A )  Patient location during evaluation: Endoscopy Anesthesia Type: General Level of consciousness: awake and alert and oriented Pain management: pain level controlled Vital Signs Assessment: post-procedure vital signs reviewed and stable Respiratory status: spontaneous breathing, nonlabored ventilation and respiratory function stable Cardiovascular status: blood pressure returned to baseline and stable Postop Assessment: no signs of nausea or vomiting Anesthetic complications: no     Last Vitals:  Vitals:   12/31/18 0839 12/31/18 0841  BP: (!) 117/54 115/64  Pulse: 75 75  Resp: 17 (!) 21  Temp:    SpO2: 100% 100%    Last Pain:  Vitals:   12/31/18 0841  TempSrc:   PainSc: 0-No pain                 Pao Haffey

## 2018-12-31 NOTE — Anesthesia Post-op Follow-up Note (Signed)
Anesthesia QCDR form completed.        

## 2018-12-31 NOTE — Progress Notes (Signed)
Pt being discharged home, discharge instructions reviewed with pt and daughter in law, states understanding, pt with no complaints

## 2018-12-31 NOTE — Telephone Encounter (Signed)
Miel, I'm going to ask that critical labs be called to ME directly in the future This was from Feb 12th and I did not receive this message until Feb 14th For any and all called reports for imaging or critical results, I want the [facility] to contact our office and have staff pull me from a room

## 2018-12-31 NOTE — Telephone Encounter (Signed)
Transition Care Management Follow-up Telephone Call  Date of discharge and from where: 12/31/18 [facility]  How have you been since you were released from the hospital? Spoke to patient's son Kaitlyn Gonzalez who states she is doing okay, just a little tired and weak.   Any questions or concerns? No   Items Reviewed:  Did the pt receive and understand the discharge instructions provided? Yes   Medications obtained and verified? Yes   Any new allergies since your discharge? No   Dietary orders reviewed? Yes  Do you have support at home? Yes  pt's son and daughter in law live with her.   Functional Questionnaire: (I = Independent and D = Dependent) ADLs: D  Bathing/Dressing- D  Meal Prep- D  Eating- I  Maintaining continence- I  Transferring/Ambulation- I with some assistance  Managing Meds- D  Follow up appointments reviewed:   PCP Hospital f/u appt confirmed? Yes  Scheduled to see Dr. Sanda Klein on 01/11/19 @ 2:40.  [place] [facility] f/u appt confirmed? Yes  Scheduled to see Dr. Allen Norris on 01/20/19  Are transportation arrangements needed? No   If their condition worsens, is the pt aware to call PCP or go to the Emergency Dept.? Yes  Was the patient provided with contact information for the PCP's office or ED? Yes  Was to pt encouraged to call back with questions or concerns? Yes

## 2018-12-31 NOTE — Op Note (Signed)
[facility] Patient Name: Kaitlyn Gonzalez Procedure Date: 12/31/2018 7:41 AM MRN: [id number] Account #: [id number] Date of Birth: [birth year] Admit Type: Inpatient Age: 83 Room: [facility] ENDO ROOM 1 Gender: Female Note Status: Finalized Procedure:            Colonoscopy Indications:          Iron deficiency anemia Providers:            Lucilla Lame MD, MD Medicines:            Propofol per Anesthesia Complications:        No immediate complications. Procedure:            Pre-Anesthesia Assessment:                       - Prior to the procedure, a History and Physical was                        performed, and patient medications and allergies were                        reviewed. The patient's tolerance of previous                        anesthesia was also reviewed. The risks and benefits of                        the procedure and the sedation options and risks were                        discussed with the patient. All questions were                        answered, and informed consent was obtained. Prior                        Anticoagulants: The patient has taken no previous                        anticoagulant or antiplatelet agents. ASA Grade                        Assessment: III - A patient with severe systemic                        disease. After reviewing the risks and benefits, the                        patient was deemed in satisfactory condition to undergo                        the procedure.                       After obtaining informed consent, the colonoscope was                        passed under direct vision. Throughout the procedure,                        the patient's blood pressure, pulse, and  oxygen                        saturations were monitored continuously. The                        Colonoscope was introduced through the anus and                        advanced to the the cecum, identified by appendiceal          orifice and ileocecal valve. The colonoscopy was                        performed without difficulty. The patient tolerated the                        procedure well. The quality of the bowel preparation                        was good. Findings:      The perianal and digital rectal examinations were normal.      Multiple small-mouthed diverticula were found in the sigmoid colon and       descending colon.      Non-bleeding internal hemorrhoids were found during retroflexion. The       hemorrhoids were Grade II (internal hemorrhoids that prolapse but reduce       spontaneously). Impression:           - Diverticulosis in the sigmoid colon and in the                        descending colon.                       - Non-bleeding internal hemorrhoids.                       - No specimens collected. Recommendation:       - Resume previous diet.                       - Continue present medications.                       - Return patient to hospital ward for ongoing care. Procedure Code(s):    --- Professional ---                       [phone], Colonoscopy, flexible; diagnostic, including                        collection of specimen(s) by brushing or washing, when                        performed (separate procedure) Diagnosis Code(s):    --- Professional ---                       D50.9, Iron deficiency anemia, unspecified CPT copyright 2018 American Medical Association. All rights reserved. The codes documented in this report are preliminary and upon coder review may  be revised to meet current compliance requirements. Lucilla Lame MD, MD 12/31/2018 8:15:38 AM This report has been signed electronically.  Number of Addenda: 0 Note Initiated On: 12/31/2018 7:41 AM Scope Withdrawal Time: 0 hours 8 minutes 57 seconds  Total Procedure Duration: 0 hours 15 minutes 32 seconds       [facility]

## 2018-12-31 NOTE — Telephone Encounter (Signed)
Barnett Applebaum with [facility] called in an abnormal Hbg of 6.7 from 12/29/2018.  I sent a high priority note to Dr. Delight Ovens nurse pool making her aware of these results.

## 2018-12-31 NOTE — Anesthesia Preprocedure Evaluation (Signed)
Anesthesia Evaluation  Patient identified by MRN, date of birth, ID band Patient awake    Reviewed: Allergy & Precautions, NPO status , Patient's Chart, lab work & pertinent test results  History of Anesthesia Complications Negative for: history of anesthetic complications  Airway Mallampati: III  TM Distance: >3 FB Neck ROM: Full    Dental  (+) Poor Dentition   Pulmonary COPD, Current Smoker,    breath sounds clear to auscultation- rhonchi (-) wheezing      Cardiovascular hypertension, Pt. on medications + Peripheral Vascular Disease  (-) CAD, (-) Past MI, (-) Cardiac Stents and (-) CABG  Rhythm:Regular Rate:Normal - Systolic murmurs and - Diastolic murmurs    Neuro/Psych neg Seizures CVA, No Residual Symptoms negative psych ROS   GI/Hepatic negative GI ROS, Neg liver ROS,   Endo/Other  negative endocrine ROSneg diabetes  Renal/GU negative Renal ROS     Musculoskeletal negative musculoskeletal ROS (+)   Abdominal (+) - obese,   Peds  Hematology  (+) anemia ,   Anesthesia Other Findings Past Medical History: No date: Dyslipidemia No date: History of stroke with residual effects     Comment:  Stroke in April 2016. No date: Hypertension No date: Legally blind in right eye, as defined in [place] No date: Peripheral vascular disease ([place]) No date: Stroke [facility])   Reproductive/Obstetrics                             Anesthesia Physical Anesthesia Plan  ASA: III  Anesthesia Plan: General   Post-op Pain Management:    Induction: Intravenous  PONV Risk Score and Plan: 1 and Propofol infusion  Airway Management Planned: Natural Airway  Additional Equipment:   Intra-op Plan:   Post-operative Plan:   Informed Consent: I have reviewed the patients History and Physical, chart, labs and discussed the procedure including the risks, benefits and alternatives for the proposed anesthesia with  the patient or authorized representative who has indicated his/her understanding and acceptance.     Dental advisory given  Plan Discussed with: CRNA and Anesthesiologist  Anesthesia Plan Comments:         Anesthesia Quick Evaluation

## 2019-01-02 NOTE — ED Provider Notes (Signed)
[facility] Emergency Department Provider Note  ____________________________________________  Time seen: Approximately 2:15 PM  I have reviewed the triage vital signs and the nursing notes.   HISTORY  Chief Complaint Rectal Bleeding   HPI Kaitlyn Gonzalez is a 83 y.o. female with a history of hypertension, peripheral vascular disease, CVA on aspirin who presents for evaluation of rectal bleeding.  Patient reports that her symptoms have been intermittent for 2 days.  She went to see her primary care doctor yesterday and was found to have a hemoglobin of 6.7.  She received a phone call from her PCP today recommended the patient came to the emergency room for evaluation.  Patient continues to have bright red blood per rectum.  No abdominal pain, no vomiting, no melena, no prior history of GI bleed.  Patient denies dizziness, chest pain or shortness of breath.  Past Medical History:  Diagnosis Date  . Dyslipidemia   . History of stroke with residual effects    Stroke in April 2016.  Marland Kitchen Hypertension   . Legally blind in right eye, as defined in [place]   . Peripheral vascular disease ([place])   . Stroke [facility])     Patient Active Problem List   Diagnosis Date Noted  . Rectal bleeding 12/30/2018  . DNR (do not resuscitate) 06/30/2018  . Vitamin B12 deficiency 06/30/2018  . Protein-calorie malnutrition, severe 06/11/2018  . Anemia 06/09/2018  . AAA (abdominal aortic aneurysm) without rupture ([place]) 04/28/2018  . Atherosclerosis of abdominal aorta ([place]) 03/17/2018  . Bullous emphysema ([place]) 03/03/2018  . Abnormal chest CT 03/03/2018  . Dilatation of aorta ([facility]) 02/15/2018  . Adynamia 01/20/2018  . Blind right eye 01/20/2018  . Left leg pain 01/20/2018  . Anterolisthesis 01/20/2018  . Chronic left hip pain 01/20/2018  . Osteoporosis 01/20/2018  . Tobacco abuse 01/20/2018  . History of stroke 09/09/2017  . Sacral fracture ([place]) 09/09/2017  . Hyperglycemia  02/16/2017  . Tachycardia with 100 - 120 beats per minute 04/09/2016  . Hypertension 02/15/2016  . Peripheral vascular disease ([place]) 02/15/2016  . Dyslipidemia 02/15/2016  . History of stroke with residual effects 02/15/2016  . Heart murmur on physical examination 02/15/2016    Past Surgical History:  Procedure Laterality Date  . COLONOSCOPY WITH PROPOFOL N/A 12/31/2018   Procedure: COLONOSCOPY WITH PROPOFOL;  Surgeon: Lucilla Lame, MD;  Location: [facility] ENDOSCOPY;  Service: Endoscopy;  Laterality: N/A;  . ESOPHAGOGASTRODUODENOSCOPY (EGD) WITH PROPOFOL N/A 12/31/2018   Procedure: ESOPHAGOGASTRODUODENOSCOPY (EGD) WITH PROPOFOL;  Surgeon: Lucilla Lame, MD;  Location: [facility] ENDOSCOPY;  Service: Endoscopy;  Laterality: N/A;  . PERIPHERAL ARTERIAL STENT GRAFT Left 2010    Prior to Admission medications   Medication Sig Start Date End Date Taking? Authorizing Provider  aspirin EC 81 MG tablet Take 81 mg by mouth daily.   Yes [provider]  Aspirin-Salicylamide-Caffeine (BC HEADACHE) 325-95-16 MG TABS Take 1 packet by mouth as directed.   Yes [provider]  Cyanocobalamin (VITAMIN B-12) 500 MCG SUBL One tablet under the tongue, dissolved; once a day 12/29/18   Arnetha Courser, MD  docusate sodium (COLACE) 100 MG capsule Take 1 capsule (100 mg total) by mouth daily. 12/31/18   Nicholes Mango, MD  ferrous sulfate 325 (65 FE) MG EC tablet Take 1 tablet (325 mg total) by mouth daily with breakfast. 12/31/18 12/31/19  Gouru, Illene Silver, MD  Vitamin D, Ergocalciferol, (DRISDOL) 1.25 MG (50000 UT) CAPS capsule Take 1 capsule (50,000 Units total) by mouth  every 7 (seven) days. 12/30/18   Arnetha Courser, MD    Allergies Patient has no known allergies.  Family History  Problem Relation Age of Onset  . Diabetes Sister   . Cancer Sister        breast  . Alzheimer's disease Mother   . Dementia Mother   . AAA (abdominal aortic aneurysm) Father   . Stroke Brother   . Hyperlipidemia Sister     . Hypertension Sister   . Stroke Brother   . Hypertension Brother     Social History Social History   Tobacco Use  . Smoking status: Current Every Day Smoker    Packs/day: 0.25    Years: 62.00    Pack years: 15.50    Types: Cigarettes    Start date: 11/18/1955  . Smokeless tobacco: Never Used  . Tobacco comment: Down to about 1/2 pack a day from 2ppd 03/03/2018  Substance Use Topics  . Alcohol use: No    Alcohol/week: 0.0 standard drinks  . Drug use: No    Review of Systems  Constitutional: Negative for fever. Eyes: Negative for visual changes. ENT: Negative for sore throat. Neck: No neck pain  Cardiovascular: Negative for chest pain. Respiratory: Negative for shortness of breath. Gastrointestinal: Negative for abdominal pain, vomiting or diarrhea. Genitourinary: Negative for dysuria. + rectal bleeding Musculoskeletal: Negative for back pain. Skin: Negative for rash. Neurological: Negative for headaches, weakness or numbness. Psych: No SI or HI  ____________________________________________   PHYSICAL EXAM:  VITAL SIGNS: ED Triage Vitals  Enc Vitals Group     BP 12/30/18 0937 120/62     Pulse Rate 12/30/18 0937 89     Resp 12/30/18 0937 16     Temp 12/30/18 0937 97.9 F (36.6 C)     Temp Source 12/30/18 0937 Oral     SpO2 12/30/18 0937 94 %     Weight 12/30/18 0933 88 lb 2.9 oz (40 kg)     Height 12/30/18 0933 5\' 5"  (1.651 m)     Head Circumference --      Peak Flow --      Pain Score 12/30/18 0933 0     Pain Loc --      Pain Edu? --      Excl. in [place]? --     Constitutional: Alert and oriented. Well appearing and in no apparent distress. HEENT:      Head: Normocephalic and atraumatic.         Eyes: Conjunctivae are normal. Sclera is non-icteric.       Mouth/Throat: Mucous membranes are moist.       Neck: Supple with no signs of meningismus. Cardiovascular: Regular rate and rhythm. No murmurs, gallops, or rubs. 2+ symmetrical distal pulses are present  in all extremities. No JVD. Respiratory: Normal respiratory effort. Lungs are clear to auscultation bilaterally. No wheezes, crackles, or rhonchi.  Gastrointestinal: Soft, non tender, and non distended with positive bowel sounds. No rebound or guarding. Genitourinary: No CVA tenderness.  Rectal exam showing brown stool guaiac positive Musculoskeletal: Nontender with normal range of motion in all extremities. No edema, cyanosis, or erythema of extremities. Neurologic: Normal speech and language. Face is symmetric. Moving all extremities. No gross focal neurologic deficits are appreciated. Skin: Skin is warm, dry and intact. No rash noted. Psychiatric: Mood and affect are normal. Speech and behavior are normal.  ____________________________________________   LABS (all labs ordered are listed, but only abnormal results are displayed)  Labs Reviewed  COMPREHENSIVE METABOLIC PANEL - Abnormal; Notable for the following components:      Result Value   Calcium 8.8 (*)    All other components within normal limits  CBC - Abnormal; Notable for the following components:   RBC 3.36 (*)    Hemoglobin 6.7 (*)    HCT 25.0 (*)    MCV 74.4 (*)    MCH 19.9 (*)    MCHC 26.8 (*)    RDW 19.6 (*)    Platelets 458 (*)    All other components within normal limits  BASIC METABOLIC PANEL - Abnormal; Notable for the following components:   BUN 7 (*)    Calcium 8.7 (*)    All other components within normal limits  CBC - Abnormal; Notable for the following components:   Hemoglobin 9.7 (*)    HCT 31.7 (*)    MCV 76.8 (*)    MCH 23.5 (*)    RDW 18.8 (*)    All other components within normal limits  HEMOGLOBIN AND HEMATOCRIT, BLOOD - Abnormal; Notable for the following components:   Hemoglobin 10.6 (*)    HCT 34.8 (*)    All other components within normal limits  PROTIME-INR  TYPE AND SCREEN  PREPARE RBC (CROSSMATCH)  ABO/RH    ____________________________________________  EKG  none ____________________________________________  RADIOLOGY  none   ____________________________________________   PROCEDURES  Procedure(s) performed: None Procedures Critical Care performed: yes  CRITICAL CARE Performed by: Rudene Re  ?  Total critical care time: 30 min  Critical care time was exclusive of separately billable procedures and treating other patients.  Critical care was necessary to treat or prevent imminent or life-threatening deterioration.  Critical care was time spent personally by me on the following activities: development of treatment plan with patient and/or surrogate as well as nursing, discussions with consultants, evaluation of patient's response to treatment, examination of patient, obtaining history from patient or surrogate, ordering and performing treatments and interventions, ordering and review of laboratory studies, ordering and review of radiographic studies, pulse oximetry and re-evaluation of patient's condition.  ____________________________________________   INITIAL IMPRESSION / ASSESSMENT AND PLAN / ED COURSE   83 y.o. female with a history of hypertension, peripheral vascular disease, CVA on aspirin who presents for evaluation of rectal bleeding.  Patient arrives to the emergency room hemodynamically stable.  Repeat labs confirm a hemoglobin of 6.7.  Rectal exam showing positive blood, will initiate transfusion and admit to hospitalist.      As part of my medical decision making, I reviewed the following data within the [place] notes reviewed and incorporated, Labs reviewed , Old chart reviewed, Discussed with admitting physician , Notes from prior ED visits and [place] Controlled Substance Database    Pertinent labs & imaging results that were available during my care of the patient were reviewed by me and considered in my medical decision  making (see chart for details).    ____________________________________________   FINAL CLINICAL IMPRESSION(S) / ED DIAGNOSES  Final diagnoses:  Lower GI bleed      NEW MEDICATIONS STARTED DURING THIS VISIT:  ED Discharge Orders         Ordered    docusate sodium (COLACE) 100 MG capsule  Daily     12/31/18 1129    ferrous sulfate 325 (65 FE) MG EC tablet  Daily with breakfast     12/31/18 1129           Note:  This document  was prepared using Systems analyst and may include unintentional dictation errors.    Alfred Levins, [place], MD 01/02/19 1420

## 2019-01-11 ENCOUNTER — Inpatient Hospital Stay: Payer: Medicare Other | Admitting: Family Medicine

## 2019-01-12 ENCOUNTER — Inpatient Hospital Stay: Payer: Medicare Other | Admitting: Internal Medicine

## 2019-01-19 ENCOUNTER — Encounter: Payer: Self-pay | Admitting: *Deleted

## 2019-01-20 ENCOUNTER — Ambulatory Visit (INDEPENDENT_AMBULATORY_CARE_PROVIDER_SITE_OTHER): Payer: Medicare Other | Admitting: Gastroenterology

## 2019-01-20 ENCOUNTER — Other Ambulatory Visit: Payer: Self-pay

## 2019-01-20 ENCOUNTER — Other Ambulatory Visit
Admission: RE | Admit: 2019-01-20 | Discharge: 2019-01-20 | Disposition: A | Discharge status: Home / Self Care | Payer: Medicare Other | Attending: Gastroenterology | Admitting: Gastroenterology

## 2019-01-20 ENCOUNTER — Encounter: Payer: Self-pay | Admitting: Gastroenterology

## 2019-01-20 VITALS — BP 125/80 | HR 92 | Ht 65.0 in | Wt 85.6 lb

## 2019-01-20 DIAGNOSIS — D508 Other iron deficiency anemias: Secondary | ICD-10-CM | POA: Diagnosis not present

## 2019-01-20 LAB — CBC WITH DIFFERENTIAL/PLATELET
Abs Immature Granulocytes: 0.08 10*3/uL — ABNORMAL HIGH (ref 0.00–0.07)
BASOS ABS: 0 10*3/uL (ref 0.0–0.1)
Basophils Relative: 1 %
Eosinophils Absolute: 0 10*3/uL (ref 0.0–0.5)
Eosinophils Relative: 0 %
HCT: 40.7 % (ref 36.0–46.0)
HCT: 41.3 % (ref 36.0–46.0)
HEMOGLOBIN: 12.3 g/dL (ref 12.0–15.0)
Hemoglobin: 12.3 g/dL (ref 12.0–15.0)
Immature Granulocytes: 1 %
Lymphocytes Relative: 15 %
Lymphs Abs: 1.3 10*3/uL (ref 0.7–4.0)
MCH: 24.9 pg — ABNORMAL LOW (ref 26.0–34.0)
MCH: 24.4 pg — ABNORMAL LOW (ref 26.0–34.0)
MCHC: 30.2 g/dL (ref 30.0–36.0)
MCHC: 29.8 g/dL — ABNORMAL LOW (ref 30.0–36.0)
MCV: 82.6 fL (ref 80.0–100.0)
MCV: 81.8 fL (ref 80.0–100.0)
Monocytes Absolute: 0.7 10*3/uL (ref 0.1–1.0)
Monocytes Relative: 8 %
Neutro Abs: 6.5 10*3/uL (ref 1.7–7.7)
Neutrophils Relative %: 75 %
Platelets: 336 10*3/uL (ref 150–400)
Platelets: 370 10*3/uL (ref 150–400)
RBC: 4.93 MIL/uL (ref 3.87–5.11)
RBC: 5.05 MIL/uL (ref 3.87–5.11)
RDW: 25.7 % — ABNORMAL HIGH (ref 11.5–15.5)
RDW: 26 % — ABNORMAL HIGH (ref 11.5–15.5)
WBC: 8.5 10*3/uL (ref 4.0–10.5)
WBC: 8.7 10*3/uL (ref 4.0–10.5)
nRBC: 0 % (ref 0.0–0.2)
nRBC: 0 % (ref 0.0–0.2)

## 2019-01-20 NOTE — Progress Notes (Signed)
Primary Care Physician: Arnetha Courser, MD  Primary Gastroenterologist:  Dr. Lucilla Lame  Chief Complaint  Patient presents with  . Follow-up    HPI: Kaitlyn Gonzalez is a 83 y.o. female here Follow-up after being in the hospital.  The patient was admitted with significant anemia and rectal bleeding.  The patient underwent an EGD and colonoscopy without any source of any active bleeding.  There is also no old blood seen in the GI tract.  The patient was recommended to undergo a capsule endoscopy after discharge for complete evaluation of her GI tract.  Current Outpatient Medications  Medication Sig Dispense Refill  . aspirin EC 81 MG tablet Take 81 mg by mouth daily.    . Aspirin-Salicylamide-Caffeine (BC HEADACHE) 325-95-16 MG TABS Take 1 packet by mouth as directed.    . Cyanocobalamin (VITAMIN B-12) 500 MCG SUBL One tablet under the tongue, dissolved; once a day 30 tablet 12  . ferrous sulfate 325 (65 FE) MG EC tablet Take 1 tablet (325 mg total) by mouth daily with breakfast. 60 tablet 0  . Vitamin D, Ergocalciferol, (DRISDOL) 1.25 MG (50000 UT) CAPS capsule Take 1 capsule (50,000 Units total) by mouth every 7 (seven) days. 4 capsule 2  . docusate sodium (COLACE) 100 MG capsule Take 1 capsule (100 mg total) by mouth daily. (Patient not taking: Reported on 01/20/2019) 10 capsule 0   No current facility-administered medications for this visit.     Allergies as of 01/20/2019  . (No Known Allergies)    ROS:  General: Negative for anorexia, weight loss, fever, chills, fatigue, weakness. ENT: Negative for hoarseness, difficulty swallowing , nasal congestion. CV: Negative for chest pain, angina, palpitations, dyspnea on exertion, peripheral edema.  Respiratory: Negative for dyspnea at rest, dyspnea on exertion, cough, sputum, wheezing.  GI: See history of present illness. GU:  Negative for dysuria, hematuria, urinary incontinence, urinary frequency, nocturnal urination.    Endo: Negative for unusual weight change.    Physical Examination:   BP 125/80   Pulse 92   Ht 5\' 5"  (1.651 m)   Wt 85 lb 9.6 oz (38.8 kg)   BMI 14.24 kg/m   General: Well-nourished, well-developed in no acute distress.  Eyes: No icterus. Conjunctivae pink. Mouth: Oropharyngeal mucosa moist and pink , no lesions erythema or exudate. Lungs: Clear to auscultation bilaterally. Non-labored. Heart: Regular rate and rhythm, no murmurs rubs or gallops.  Abdomen: Bowel sounds are normal, nontender, nondistended, no hepatosplenomegaly or masses, no abdominal bruits or hernia , no rebound or guarding.   Extremities: No lower extremity edema. No clubbing or deformities. Neuro: Alert and oriented x 3.  Grossly intact. Right eye with cataracts. Skin: Warm and dry, no jaundice.   Psych: Alert and cooperative, normal mood and affect.  Labs:    Imaging Studies: No results found.  Assessment and Plan:   Kaitlyn Gonzalez is a 83 y.o. y/o female who was in the hospital with profound anemia and had an EGD and colonoscopy without any cause for the anemia seen.  The patient will have her blood checked today for continued anemia and if it shows her hemoglobin to be down she has been told that she should be set up for capsule endoscopy. The patient has been explained the plan and agrees with it.    Lucilla Lame, MD. Marval Regal   Note: This dictation was prepared with Dragon dictation along with smaller phrase technology. Any transcriptional errors that result from this process are  unintentional.

## 2019-01-21 ENCOUNTER — Telehealth: Payer: Self-pay

## 2019-01-21 NOTE — Telephone Encounter (Signed)
-----   Message from Lucilla Lame, MD sent at 01/20/2019  5:06 PM EST ----- That the patient know that her blood count is back to normal at 12.3.

## 2019-01-21 NOTE — Telephone Encounter (Signed)
Left vm letting pt's son know of lab results.

## 2019-01-29 ENCOUNTER — Telehealth: Payer: Self-pay

## 2019-01-29 NOTE — Telephone Encounter (Signed)
Call pt regarding lung screening. Left message for pt to return call.  

## 2019-02-01 ENCOUNTER — Telehealth: Payer: Self-pay

## 2019-02-01 NOTE — Telephone Encounter (Signed)
Called patient for COVID-19 screening. LM on VM.   

## 2019-02-01 NOTE — Progress Notes (Signed)
Kaitlyn Gonzalez Consultation      Assessment and Plan:  COPD/emphysema with dyspnea on exertion with changes of pulmonary fibrosis, (does not appear typical UIP/idiopathic pulmonary fibrosis.). - Discussed severe emphysematous changes and fibrotic changes, reviewed imaging with patient and sons. - Discussed best option for reducing continued advancement of COPD is complete smoking cessation. - We will check alpha-1, will administer Prevnar 13 vaccine today. --Not currently on inhaler and feels that her breathing is doing well.   Preventive care: - Alpha-1 testing 03/17/2018 - Prevnar vaccine administered 03/17/2018. - Currently enrolled in lung cancer screening. - Smoking, discussed cessation  Nicotine abuse. - Advised patient that complete smoking cessation is the best thing she can do for her health.  Spent 3 minutes of discussion.  Noted that multiple family members at home smoke, given patient's emphysema.  Increased risk of developing emphysema themselves.  Deconditioning/weakness. -This is multifactorial due to previous stroke, advanced age and multiple comorbidities.  She has completed physical therapy previously, recently had a fall and has been hesitant to walk again.  Encourage patient to walk with walker to try to maintain her strength.  No orders of the defined types were placed in this encounter.  No orders of the defined types were placed in this encounter.  No follow-ups on file.   Date: 02/01/2019  MRN# [id number] Kaitlyn Gonzalez 06-Sep-1936  Referring Physician: Dr. Sanda Klein for dyspnea and emphysema.   Kaitlyn Gonzalez is a 83 y.o. old female seen in consultation for chief complaint of:    Chief Complaint  Patient presents with  . Follow-up    pt states breathing is doing well. feels that she currently has a head cold- prod cough with clear mucus & watery eyes    HPI:   She underwent CT lung cancer screening which showed severe emphysema  and basilar fibrosis. She lives with her kids. She does not drive a car, she gets winded with mild activity such as dressing or walking, bathing. She is smoking 5 cigs per day or less, used to smoke 1 ppd until 2015.  She takes no inhalers at home. She feels that her breathing has been doing well. She Advair was tried at last visit but she did not feel that it helped and did not feel that she needed it. Currently she feels well, she has a bit of a cold.  Denies reflux.   Imaging personally reviewed; CT chest low-dose 02/10/2018.  Very severe emphysematous changes throughout both lungs, most severe in the apices, but also affecting the bases,  with moderate to severe bibasilar fibrotic changes.  Repeat scan in 1 year was recommended for lung cancer screening.   Medication:    Current Outpatient Medications:  .  aspirin EC 81 MG tablet, Take 81 mg by mouth daily., Disp: , Rfl:  .  Aspirin-Salicylamide-Caffeine (BC HEADACHE) 325-95-16 MG TABS, Take 1 packet by mouth as directed., Disp: , Rfl:  .  Cyanocobalamin (VITAMIN B-12) 500 MCG SUBL, One tablet under the tongue, dissolved; once a day, Disp: 30 tablet, Rfl: 12 .  docusate sodium (COLACE) 100 MG capsule, Take 1 capsule (100 mg total) by mouth daily. (Patient not taking: Reported on 01/20/2019), Disp: 10 capsule, Rfl: 0 .  ferrous sulfate 325 (65 FE) MG EC tablet, Take 1 tablet (325 mg total) by mouth daily with breakfast., Disp: 60 tablet, Rfl: 0 .  Vitamin D, Ergocalciferol, (DRISDOL) 1.25 MG (50000 UT) CAPS capsule, Take 1 capsule (50,000 Units total) by  mouth every 7 (seven) days., Disp: 4 capsule, Rfl: 2   Allergies:  Patient has no known allergies.  Review of Systems:  Constitutional: Feels well. Cardiovascular: Denies chest pain, exertional chest pain.  Pulmonary: Denies hemoptysis, pleuritic chest pain.   The remainder of systems were reviewed and were found to be negative other than what is documented in the HPI.    Physical  Examination:   VS: BP 112/60 (BP Location: Left Arm, Cuff Size: Normal)   Pulse 89   Ht 5\' 5"  (1.651 m)   Wt 87 lb 9.3 oz (39.7 kg)   SpO2 96%   BMI 14.57 kg/m   General Appearance: No distress  Neuro:without focal findings, mental status, speech normal, alert and oriented HEENT: PERRLA, EOM intact Pulmonary: No wheezing, No rales  CardiovascularNormal S1,S2.  No m/r/g.  Abdomen: Benign, Soft, non-tender, No masses Renal:  No costovertebral tenderness  GU:  No performed at this time. Endoc: No evident thyromegaly, no signs of acromegaly or Cushing features Skin:   warm, no rashes, no ecchymosis  Extremities: normal, no cyanosis, clubbing.      LABORATORY PANEL:   CBC No results for input(s): WBC, HGB, HCT, PLT in the last 168 hours. ------------------------------------------------------------------------------------------------------------------  Chemistries  No results for input(s): NA, K, CL, CO2, GLUCOSE, BUN, CREATININE, CALCIUM, MG, AST, ALT, ALKPHOS, BILITOT in the last 168 hours.  Invalid input(s): GFRCGP ------------------------------------------------------------------------------------------------------------------  Cardiac Enzymes No results for input(s): TROPONINI in the last 168 hours. ------------------------------------------------------------  RADIOLOGY:  No results found.     Thank  you for the consultation and for allowing [facility] to assist in the care of your patient. Our recommendations are noted above.  Please contact us if we can be of further service.  Marda Stalker, M.D., F.C.C.P.  Board Certified in Internal Gonzalez, Pulmonary Gonzalez, [place], and Sleep Gonzalez.  [facility] Office Number: [phone]   02/01/2019

## 2019-02-02 ENCOUNTER — Encounter: Payer: Self-pay | Admitting: Internal Medicine

## 2019-02-02 ENCOUNTER — Ambulatory Visit (INDEPENDENT_AMBULATORY_CARE_PROVIDER_SITE_OTHER): Payer: Medicare Other | Admitting: Internal Medicine

## 2019-02-02 ENCOUNTER — Other Ambulatory Visit: Payer: Self-pay

## 2019-02-02 VITALS — BP 112/60 | HR 89 | Ht 65.0 in | Wt 87.6 lb

## 2019-02-02 DIAGNOSIS — J439 Emphysema, unspecified: Secondary | ICD-10-CM

## 2019-02-02 NOTE — Patient Instructions (Addendum)
--  Quitting smoking is the most important thing that you can do for your health.  --Quitting smoking will have greater affect on your health than any medicine that we can give you.   If you feel that you are doing well without your inhaler then you can stay off of it.  Continue to try to be active.

## 2019-02-03 ENCOUNTER — Encounter: Payer: Self-pay | Admitting: *Deleted

## 2019-03-03 ENCOUNTER — Telehealth: Payer: Self-pay | Admitting: Family Medicine

## 2019-03-03 NOTE — Telephone Encounter (Signed)
Pt 's pharmacy called an needs verification on her Vit B 64 , the pharmacist is not sure if should be 500mg  or 5,000mg . This was written in back in 02/20 for her patient is requesting this medication.

## 2019-03-04 NOTE — Telephone Encounter (Signed)
Can you please let her know? It's in the med list

## 2019-03-04 NOTE — Telephone Encounter (Signed)
Pharmacist notified of 500mg  per chart.

## 2019-03-22 IMAGING — CR DG HIP (WITH OR WITHOUT PELVIS) 2-3V*L*
1 series · 3 of 3 positions shown · non-contrast
Comparison: None.

CLINICAL DATA: Chronic left hip pain.  Fall 3 weeks ago.

EXAM:
DG HIP (WITH OR WITHOUT PELVIS) 2-3V LEFT

[Series 1: dg hip unilat w or w/o pelvis 2-3 views  · non-contrast · 0.14mm/px · 3 of 3 slices shown]
[im 1/3]
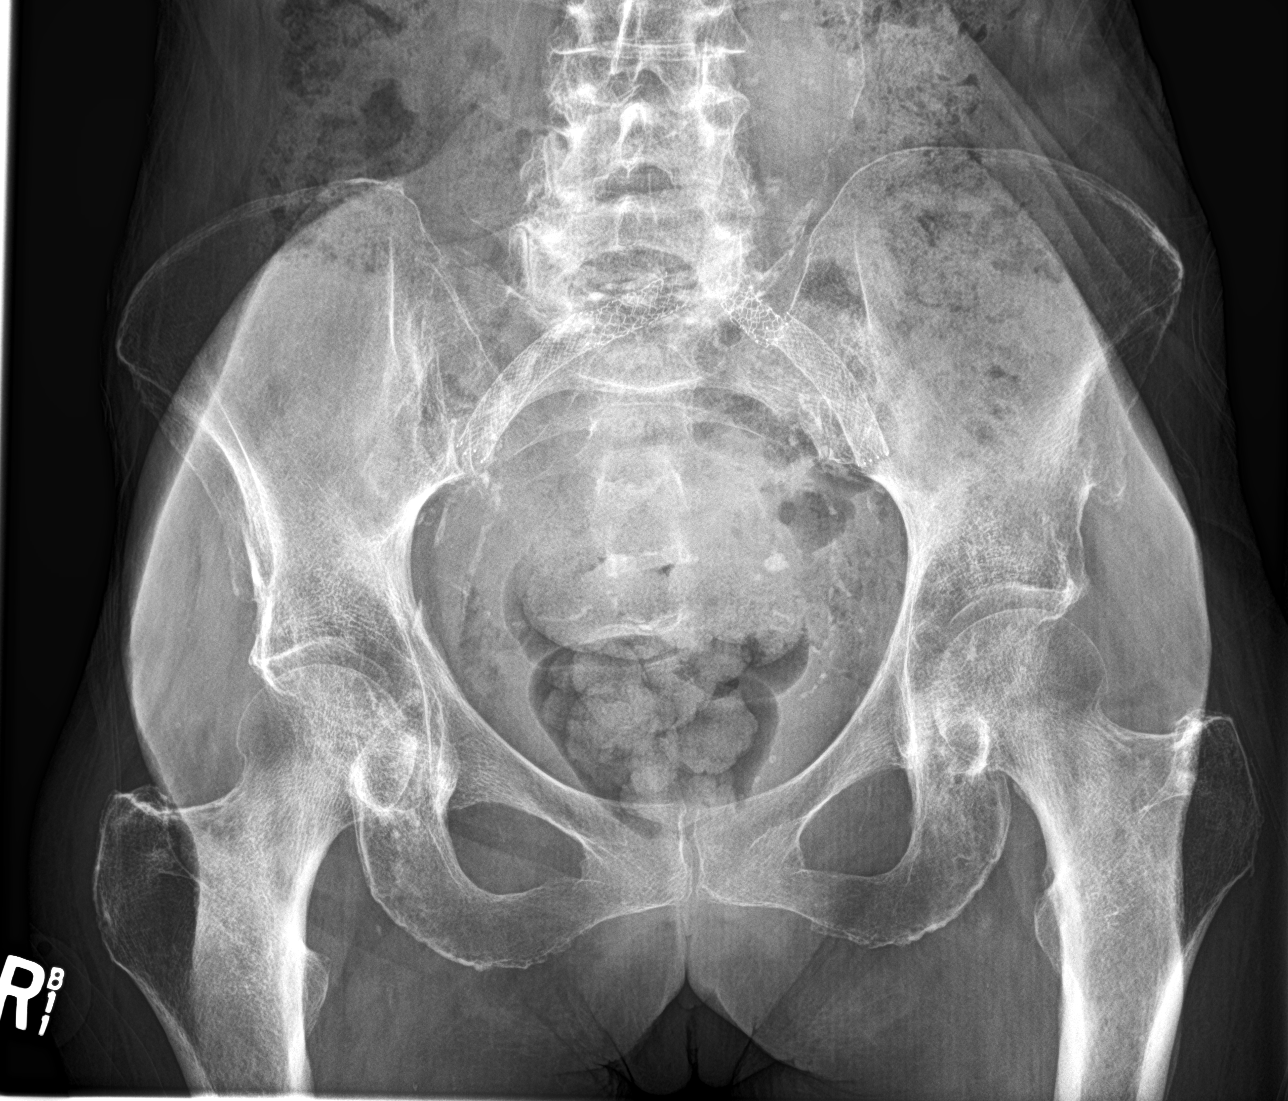
[im 2/3]
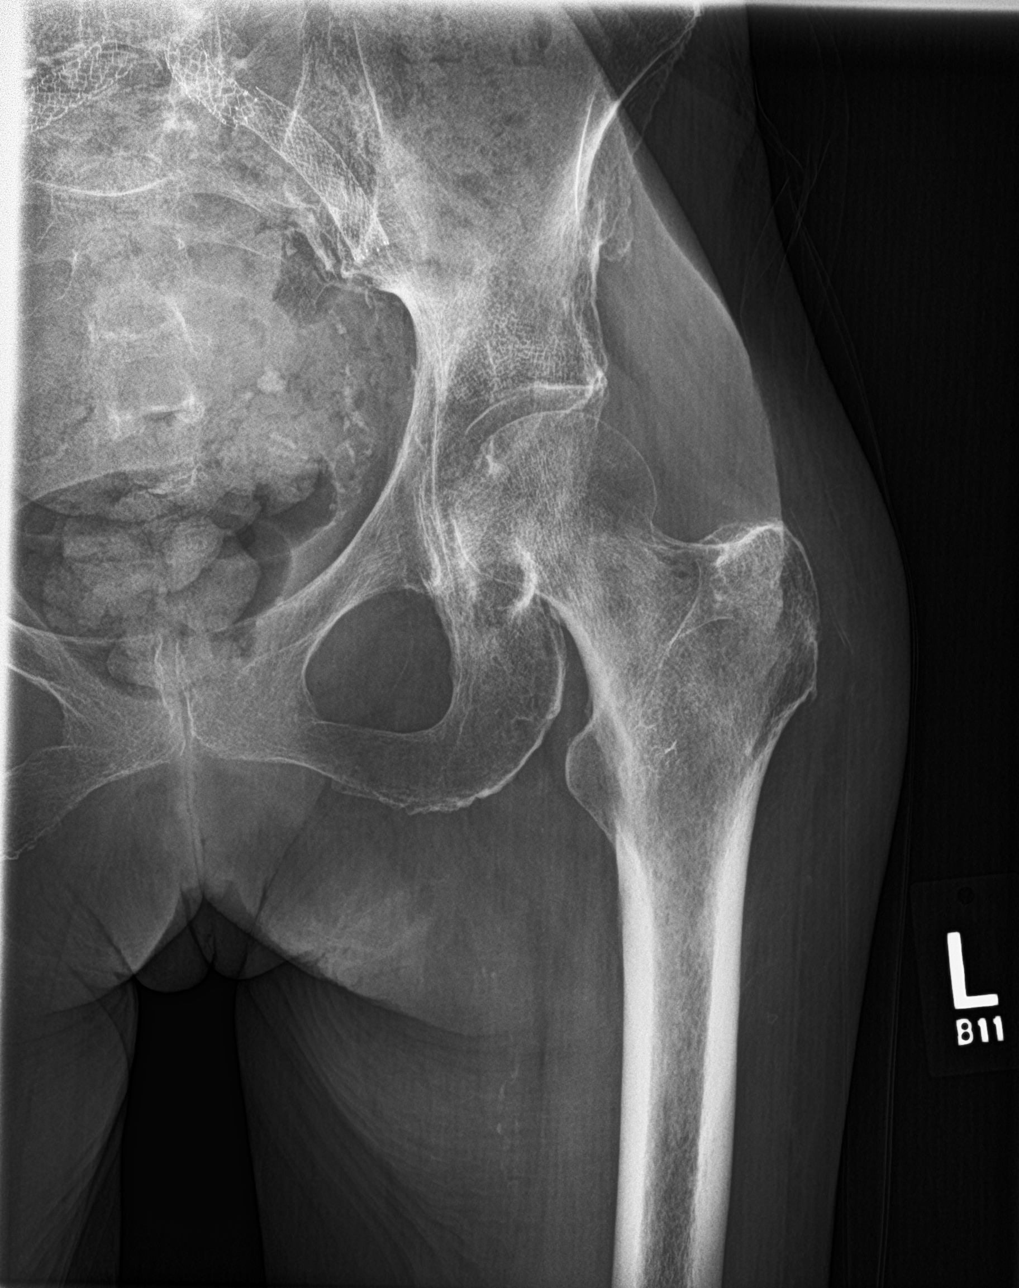
[im 3/3]
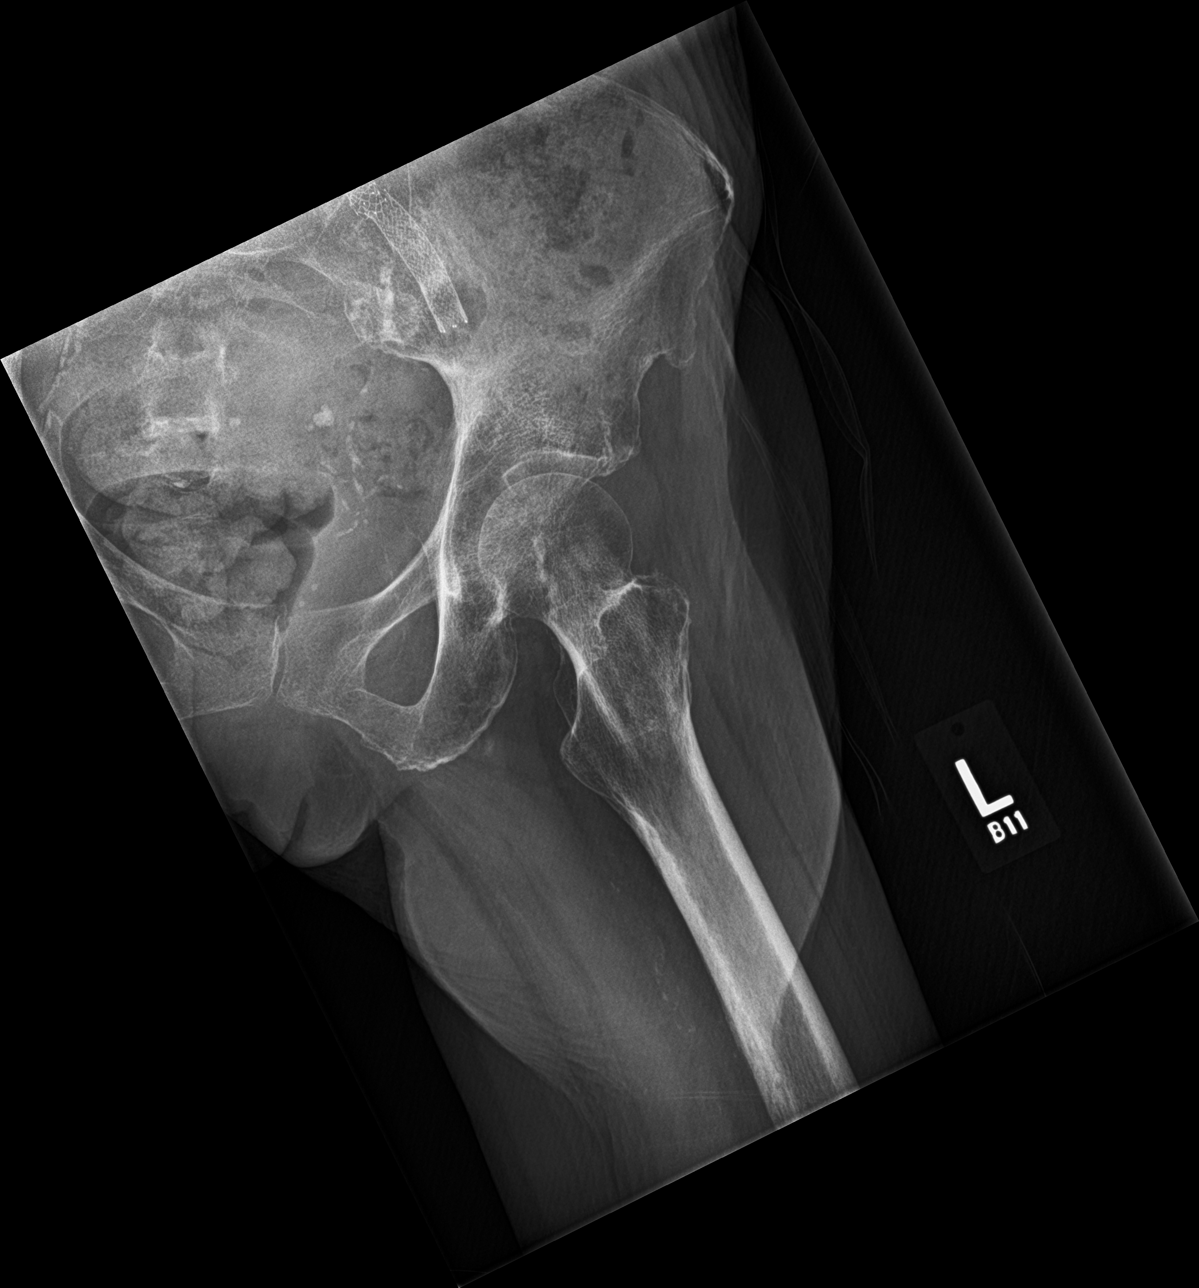

[3 of 3 positions shown; findings below may reference images not displayed]

FINDINGS: Bilateral iliac stents noted. No acute bony abnormality.
Specifically, no fracture, subluxation, or dislocation. Diffuse
osteopenia.
IMPRESSION: No acute bony abnormality.

## 2019-03-28 ENCOUNTER — Telehealth: Payer: Self-pay | Admitting: *Deleted

## 2019-03-28 NOTE — Telephone Encounter (Signed)
Left message for patient to notify them that it is time to schedule annual low dose lung cancer screening CT scan. Instructed patient to call back to verify information prior to the scan being scheduled.  

## 2019-04-15 ENCOUNTER — Telehealth: Payer: Self-pay | Admitting: Family Medicine

## 2019-04-15 NOTE — Telephone Encounter (Signed)
@  [facility]   Outreach Note  04/15/2019 Name: Kaitlyn Gonzalez MRN: [id number] DOB: 1936/02/17  Referred by: Arnetha Courser, MD Reason for referral : Chronic Care Management (Initial CCM outreach call )   An unsuccessful telephone outreach was attempted today. The patient was referred to the case management team by for assistance with chronic care management and care coordination.   Follow Up Plan: A HIPPA compliant phone message was left for the patient providing contact information and requesting a return call.  The care management team will reach out to the patient again over the next 7 days.  If patient returns call to provider office, please advise to call [place] at [place]  [email or web address]   ??[phone]

## 2019-04-19 NOTE — Telephone Encounter (Signed)
@  [facility]   Outreach Note  04/19/2019 Name: Kaitlyn Gonzalez MRN: [id number] DOB: 01-31-1936  Referred by: Arnetha Courser, MD Reason for referral : Chronic Care Management (Initial CCM outreach call ) and Chronic Care Management (Second CCM outreach call was unsuccessful )   A second unsuccessful telephone outreach was attempted today. The patient was referred to the case management team for assistance with chronic care management and care coordination.   Follow Up Plan: A HIPPA compliant phone message was left for the patient providing contact information and requesting a return call.  The care management team will reach out to the patient again over the next 7 days.  If patient returns call to provider office, please advise to call [place] at [place]  [email or web address]   ??[phone]

## 2019-04-20 ENCOUNTER — Other Ambulatory Visit: Payer: Self-pay | Admitting: Family Medicine

## 2019-04-21 ENCOUNTER — Telehealth: Payer: Self-pay | Admitting: Family Medicine

## 2019-04-21 NOTE — Chronic Care Management (AMB) (Signed)
°  Chronic Care Management   Outreach Note  04/21/2019 Name: Debi Cousin MRN: [id number] DOB: Mar 15, 1936  Referred by: Arnetha Courser, MD Reason for referral : Chronic Care Management (Initial CCM outreach was unsuccessful); Chronic Care Management (Second CCM outreach call was unsuccessful); and Chronic Care Management (Third CCM outreach call was unsuccessful)   Third unsuccessful telephone outreach was attempted today. The patient was referred to the case management team for assistance with chronic care management and care coordination. The patient's primary care provider has been notified of our unsuccessful attempts to make or maintain contact with the patient. The care management team is pleased to engage with this patient at any time in the future should he/she be interested in assistance from the care management team.   Follow Up Plan: The care management team is available to follow up with the patient after provider conversation with the patient regarding recommendation for care management engagement and subsequent re-referral to the care management team.   [place]  [email or web address]   ??[phone]

## 2019-04-21 NOTE — Chronic Care Management (AMB) (Signed)
°  Chronic Care Management   Outreach Note  04/21/2019 Name: Kaitlyn Gonzalez MRN: [id number] DOB: 1936-09-11  Referred by: Arnetha Courser, MD Reason for referral : Chronic Care Management (Third CCM outreach call was unsuccessful. )   Third unsuccessful telephone outreach was attempted today. The patient was referred to the case management team for assistance with chronic care management and care coordination. The patient's primary care provider has been notified of our unsuccessful attempts to make or maintain contact with the patient. The care management team is pleased to engage with this patient at any time in the future should he/she be interested in assistance from the care management team.   Follow Up Plan: The care management team is available to follow up with the patient after provider conversation with the patient regarding recommendation for care management engagement and subsequent re-referral to the care management team.   [place]  [email or web address]   ??[phone]

## 2019-05-04 ENCOUNTER — Encounter (INDEPENDENT_AMBULATORY_CARE_PROVIDER_SITE_OTHER): Payer: Medicare Other

## 2019-05-04 ENCOUNTER — Other Ambulatory Visit (INDEPENDENT_AMBULATORY_CARE_PROVIDER_SITE_OTHER): Payer: Medicare Other

## 2019-05-04 ENCOUNTER — Ambulatory Visit (INDEPENDENT_AMBULATORY_CARE_PROVIDER_SITE_OTHER): Payer: Medicare Other | Admitting: Vascular Surgery

## 2019-05-06 ENCOUNTER — Telehealth: Payer: Self-pay | Admitting: *Deleted

## 2019-05-06 ENCOUNTER — Telehealth: Payer: Self-pay

## 2019-05-06 DIAGNOSIS — Z87891 Personal history of nicotine dependence: Secondary | ICD-10-CM

## 2019-05-06 DIAGNOSIS — Z122 Encounter for screening for malignant neoplasm of respiratory organs: Secondary | ICD-10-CM

## 2019-05-06 NOTE — Telephone Encounter (Signed)
Patient has been notified that annual lung cancer screening low dose CT scan is due currently or will be in near future. Confirmed that patient is within the age range of 55-77, and asymptomatic, (no signs or symptoms of lung cancer). Patient denies illness that would prevent curative treatment for lung cancer if found. Verified smoking history, (current 96 pack year). The shared decision making visit was done 02/10/18. Patient is agreeable for CT scan being scheduled.

## 2019-05-06 NOTE — Telephone Encounter (Signed)
Call pt regarding lung screening. Left message for pt to return call.  

## 2019-05-11 ENCOUNTER — Other Ambulatory Visit: Payer: Self-pay

## 2019-05-11 ENCOUNTER — Ambulatory Visit
Admission: RE | Admit: 2019-05-11 | Discharge: 2019-05-11 | Disposition: A | Discharge status: Home / Self Care | Payer: Medicare Other | Source: Ambulatory Visit | Attending: Oncology | Admitting: Oncology

## 2019-05-11 DIAGNOSIS — Z87891 Personal history of nicotine dependence: Secondary | ICD-10-CM

## 2019-05-11 DIAGNOSIS — I700 Atherosclerosis of aorta: Secondary | ICD-10-CM | POA: Diagnosis not present

## 2019-05-11 DIAGNOSIS — Z122 Encounter for screening for malignant neoplasm of respiratory organs: Secondary | ICD-10-CM | POA: Diagnosis not present

## 2019-05-11 DIAGNOSIS — F17210 Nicotine dependence, cigarettes, uncomplicated: Secondary | ICD-10-CM | POA: Insufficient documentation

## 2019-05-11 DIAGNOSIS — J439 Emphysema, unspecified: Secondary | ICD-10-CM | POA: Insufficient documentation

## 2019-05-12 ENCOUNTER — Encounter: Payer: Self-pay | Admitting: *Deleted

## 2019-06-02 ENCOUNTER — Ambulatory Visit (INDEPENDENT_AMBULATORY_CARE_PROVIDER_SITE_OTHER): Payer: Medicare Other | Admitting: Nurse Practitioner

## 2019-06-02 ENCOUNTER — Other Ambulatory Visit (INDEPENDENT_AMBULATORY_CARE_PROVIDER_SITE_OTHER): Payer: Medicare Other

## 2019-06-02 ENCOUNTER — Encounter (INDEPENDENT_AMBULATORY_CARE_PROVIDER_SITE_OTHER): Payer: Medicare Other

## 2019-06-14 ENCOUNTER — Telehealth: Payer: Self-pay

## 2019-06-14 NOTE — Telephone Encounter (Signed)
Patients sister Hassan Rowan notified and stated she would contact patients son [name] and have him take the patient to the ER.

## 2019-06-14 NOTE — Telephone Encounter (Signed)
Copied from [place] [id number]. Topic: Quick Communication - See Telephone Encounter >> Jun 14, 2019  9:31 AM Loma [place] wrote: CRM for notification. See Telephone encounter for: 06/14/19.Pls reach out to Coral Ceo (pt sister) at [phone] or if not available, pt son, [name]. Pt is just lying in the bed and they think has an extreme UTI but also loose stools. They are afraid something is going on with her and want to know with her age vs covid would dr advise ER  basic- advise due to worsening condition

## 2019-06-14 NOTE — Telephone Encounter (Signed)
Recommend urgent care or ER for immediate evaluation due to patients change in demeanor. No availability in office.

## 2019-06-21 ENCOUNTER — Ambulatory Visit: Payer: Medicare Other

## 2019-07-29 ENCOUNTER — Ambulatory Visit: Payer: Medicare Other

## 2019-09-21 ENCOUNTER — Ambulatory Visit (INDEPENDENT_AMBULATORY_CARE_PROVIDER_SITE_OTHER): Payer: Medicare Other | Admitting: Internal Medicine

## 2019-09-21 ENCOUNTER — Other Ambulatory Visit: Payer: Self-pay

## 2019-09-21 ENCOUNTER — Encounter: Payer: Self-pay | Admitting: Internal Medicine

## 2019-09-21 VITALS — BP 120/80 | HR 106 | Temp 97.2°F | Ht 65.0 in | Wt 82.5 lb

## 2019-09-21 DIAGNOSIS — Z13220 Encounter for screening for lipoid disorders: Secondary | ICD-10-CM

## 2019-09-21 DIAGNOSIS — I340 Nonrheumatic mitral (valve) insufficiency: Secondary | ICD-10-CM | POA: Diagnosis not present

## 2019-09-21 DIAGNOSIS — I10 Essential (primary) hypertension: Secondary | ICD-10-CM | POA: Diagnosis not present

## 2019-09-21 DIAGNOSIS — R000 Tachycardia, unspecified: Secondary | ICD-10-CM | POA: Diagnosis not present

## 2019-09-21 DIAGNOSIS — R29898 Other symptoms and signs involving the musculoskeletal system: Secondary | ICD-10-CM

## 2019-09-21 DIAGNOSIS — Z8673 Personal history of transient ischemic attack (TIA), and cerebral infarction without residual deficits: Secondary | ICD-10-CM

## 2019-09-21 DIAGNOSIS — R0989 Other specified symptoms and signs involving the circulatory and respiratory systems: Secondary | ICD-10-CM | POA: Diagnosis not present

## 2019-09-21 NOTE — Patient Instructions (Signed)
Other Instructions Call your Primary Care Provider to schedule follow up appointment for further evaluation; history of stroke.   Medication Instructions:  Your physician recommends that you continue on your current medications as directed. Please refer to the Current Medication list given to you today.  *If you need a refill on your cardiac medications before your next appointment, please call your pharmacy*  Lab Work: Your physician recommends that you return for lab work in: TODAY - CBC, CMET, LIPID, TSH.  If you have labs (blood work) drawn today and your tests are completely normal, you will receive your results only by: Marland Kitchen MyChart Message (if you have MyChart) OR . A paper copy in the mail If you have any lab test that is abnormal or we need to change your treatment, we will call you to review the results.  Testing/Procedures: Your physician has requested that you have an echocardiogram. Echocardiography is a painless test that uses sound waves to create images of your heart. It provides your doctor with information about the size and shape of your heart and how well your heart's chambers and valves are working. This procedure takes approximately one hour. There are no restrictions for this procedure. You may get an IV, if needed, to receive an ultrasound enhancing agent through to better visualize your heart.    Your physician has requested that you have a carotid duplex. This test is an ultrasound of the carotid arteries in your neck. It looks at blood flow through these arteries that supply the brain with blood. Allow one hour for this exam. There are no restrictions or special instructions.  Follow-Up: At [facility], you and your health needs are our priority.  As part of our continuing mission to provide you with exceptional heart care, we have created designated Provider Care Teams.  These Care Teams include your primary Cardiologist (physician) and Advanced Practice Providers  (APPs -  Physician Assistants and Nurse Practitioners) who all work together to provide you with the care you need, when you need it.  Your next appointment:   4-6 weeks.  The format for your next appointment:   In Person  Provider:    You may see DR Harrell Gave END or one of the following Advanced Practice Providers on your designated Care Team:    Murray Hodgkins, NP  Christell Faith, PA-C  Marrianne Mood, PA-C

## 2019-09-21 NOTE — Progress Notes (Signed)
Follow-up Outpatient Visit Date: 09/21/2019  Primary Care Provider: Arnetha Courser, MD [address] [place] [address]  Chief Complaint: Left leg weakness  HPI:  Kaitlyn Gonzalez is a 83 y.o. year-old female with history of mitral regurgitation, hypertension, hyperlipidemia, PVD, stroke,, and COPD who presents for follow-up of mitral regurgitation.  She was last seen in our office in 04/2016 by Dr. Yvone Neu for evaluation of a heart murmur.  Echo had shown mild mitral regurgitation.  24-hour Holter monitor showed rare PACs and PVCs.  She was hospitalized in 12/2018 with GI bleed and initial hemoglobin of 6.7.  Bleeding was thought to be due to hemorrhoids, as no other source was identified by EGD or colonoscopy.  Today, Kaitlyn Gonzalez's only complaint is of weakness in the left leg, predominantly involving the thigh.  This began fairly abruptly 5 to 6 months ago and seems to wax and wane in severity.  At times, it feels as though her left leg just wants to give out.  It is also somewhat painful in her thigh.  She reports falling once a few months ago while going to the bathroom.  She typically ambulates with a cane.  She otherwise denies focal weakness and paresthesias.  She has not had any chest pain, shortness of breath, palpitations, lightheadedness, or edema.  The only medications that she is taking at this time are aspirin and BC powder as needed for headaches.  --------------------------------------------------------------------------------------------------  Past Medical History:  Diagnosis Date  . Dyslipidemia   . History of stroke with residual effects    Stroke in April 2016.  Marland Kitchen Hypertension   . Legally blind in right eye, as defined in [place]   . Peripheral vascular disease ([place])   . Stroke [facility])    Past Surgical History:  Procedure Laterality Date  . COLONOSCOPY WITH PROPOFOL N/A 12/31/2018   Procedure: COLONOSCOPY WITH PROPOFOL;  Surgeon: Lucilla Lame, MD;  Location: [facility]  ENDOSCOPY;  Service: Endoscopy;  Laterality: N/A;  . ESOPHAGOGASTRODUODENOSCOPY (EGD) WITH PROPOFOL N/A 12/31/2018   Procedure: ESOPHAGOGASTRODUODENOSCOPY (EGD) WITH PROPOFOL;  Surgeon: Lucilla Lame, MD;  Location: [facility] ENDOSCOPY;  Service: Endoscopy;  Laterality: N/A;  . PERIPHERAL ARTERIAL STENT GRAFT Left 2010    No outpatient medications have been marked as taking for the 09/21/19 encounter (Appointment) with Eura Mccauslin, Harrell Gave, MD.    Allergies: Patient has no known allergies.  Social History   Tobacco Use  . Smoking status: Current Every Day Smoker    Packs/day: 0.25    Years: 62.00    Pack years: 15.50    Types: Cigarettes    Start date: 11/18/1955  . Smokeless tobacco: Never Used  . Tobacco comment: Down to about 1/2 pack a day from 2ppd 03/03/2018  Substance Use Topics  . Alcohol use: No    Alcohol/week: 0.0 standard drinks  . Drug use: No    Family History  Problem Relation Age of Onset  . Diabetes Sister   . Cancer Sister        breast  . Alzheimer's disease Mother   . Dementia Mother   . AAA (abdominal aortic aneurysm) Father   . Stroke Brother   . Hyperlipidemia Sister   . Hypertension Sister   . Stroke Brother   . Hypertension Brother     Review of Systems: A 12-system review of systems was performed and was negative except as noted in the HPI.  --------------------------------------------------------------------------------------------------  Physical Exam: There were no vitals taken for this visit.  General: NAD. HEENT: No conjunctival pallor or scleral icterus.  Facemask in place Neck: Supple without lymphadenopathy, thyromegaly, JVD, or HJR.  Soft left carotid bruit noted. Lungs: Normal work of breathing. Clear to auscultation bilaterally without wheezes or crackles. Heart: Regular rate and rhythm with 2/6 holosystolic murmur loudest at the left lower sternal border.  No rubs or gallops. Abd: Bowel sounds present. Soft, NT/ND without  hepatosplenomegaly Ext: No lower extremity edema. Radial, PT, and DP pulses are 2+ bilaterally. Neuro: Patient is blind in the right eye.  Otherwise, cranial nerves III through XII are intact.  Normal fine touch sensation throughout.  Strength is 4/5 in both upper and right lower extremities.  Hip flexion and knee extension are 4 -/5 on the left. Skin: Warm and dry without rash.  EKG: Sinus tachycardia (heart rate 106 bpm).  Otherwise, no significant abnormality.  Lab Results  Component Value Date   WBC 8.5 01/20/2019   WBC 8.7 01/20/2019   HGB 12.3 01/20/2019   HGB 12.3 01/20/2019   HCT 40.7 01/20/2019   HCT 41.3 01/20/2019   MCV 82.6 01/20/2019   MCV 81.8 01/20/2019   PLT 336 01/20/2019   PLT 370 01/20/2019    Lab Results  Component Value Date   NA 137 12/31/2018   K 3.6 12/31/2018   CL 108 12/31/2018   CO2 23 12/31/2018   BUN 7 (L) 12/31/2018   CREATININE 0.48 12/31/2018   GLUCOSE 82 12/31/2018   ALT 9 12/30/2018    Lab Results  Component Value Date   CHOL 144 12/29/2018   HDL 63 12/29/2018   LDLCALC 70 12/29/2018   TRIG 40 12/29/2018   CHOLHDL 2.3 12/29/2018    --------------------------------------------------------------------------------------------------  ASSESSMENT AND PLAN: Mitral regurgitation: No signs or symptoms to suggest heart failure associated with worsening valvular disease.  2/6 systolic murmur again noted on exam today.  Given sinus tachycardia, I think it is reasonable to repeat an echocardiogram to ensure that might regurgitation has not worsened or that other structural abnormalities are not present.  Left leg weakness and history of stroke: Left leg weakness is nonspecific but certainly concerning for stroke given the patient's history of remote CVA and relatively abrupt onset of left leg weakness 5 to 6 months ago.  Soft left carotid bruit is also noted on exam today.  I have advised Kaitlyn Gonzalez to begin taking aspirin 81 mg daily.  We will  check a fasting lipid panel today to assess her lipids in anticipation of likely addition of statin therapy.  I also check a CBC, CMP, and TSH today.  We will also make arrangements for transthoracic echocardiogram and carotid Doppler examination.  I have advised her to schedule a visit with her PCP as soon as possible for further evaluation.  Sinus tachycardia: Nonspecific finding and without localizing symptoms other than the left leg pain.  Given history of GI bleed, I am certainly concerned for recurrent anemia.  Electrolyte or thyroid abnormalities are also consideration.  I will therefore check a CBC, CMP, and TSH today.  Follow-up: Return to clinic in 6 weeks.  Nelva Bush, MD 09/21/2019 7:22 AM

## 2019-09-22 LAB — COMPREHENSIVE METABOLIC PANEL
ALT: 5 IU/L (ref 0–32)
AST: 13 IU/L (ref 0–40)
Albumin/Globulin Ratio: 1.3 (ref 1.2–2.2)
Albumin: 4 g/dL (ref 3.6–4.6)
Alkaline Phosphatase: 74 IU/L (ref 39–117)
BUN/Creatinine Ratio: 21 (ref 12–28)
BUN: 11 mg/dL (ref 8–27)
Bilirubin Total: 0.2 mg/dL (ref 0.0–1.2)
CO2: 17 mmol/L — ABNORMAL LOW (ref 20–29)
Calcium: 9.5 mg/dL (ref 8.7–10.3)
Chloride: 107 mmol/L — ABNORMAL HIGH (ref 96–106)
Creatinine, Ser: 0.52 mg/dL — ABNORMAL LOW (ref 0.57–1.00)
GFR calc Af Amer: 104 mL/min/{1.73_m2} (ref >59)
GFR calc non Af Amer: 90 mL/min/{1.73_m2} (ref >59)
Globulin, Total: 3.2 g/dL (ref 1.5–4.5)
Glucose: 76 mg/dL (ref 65–99)
Potassium: 4.4 mmol/L (ref 3.5–5.2)
Sodium: 144 mmol/L (ref 134–144)
Total Protein: 7.2 g/dL (ref 6.0–8.5)

## 2019-09-22 LAB — CBC WITH DIFFERENTIAL/PLATELET
Basophils Absolute: 0 10*3/uL (ref 0.0–0.2)
Basos: 0 %
EOS (ABSOLUTE): 0.1 10*3/uL (ref 0.0–0.4)
Eos: 1 %
Hematocrit: 28.8 % — ABNORMAL LOW (ref 34.0–46.6)
Hemoglobin: 9 g/dL — ABNORMAL LOW (ref 11.1–15.9)
Immature Grans (Abs): 0.1 10*3/uL (ref 0.0–0.1)
Immature Granulocytes: 1 %
Lymphocytes Absolute: 1.6 10*3/uL (ref 0.7–3.1)
Lymphs: 15 %
MCH: 27.4 pg (ref 26.6–33.0)
MCHC: 31.3 g/dL — ABNORMAL LOW (ref 31.5–35.7)
MCV: 88 fL (ref 79–97)
Monocytes Absolute: 0.7 10*3/uL (ref 0.1–0.9)
Monocytes: 7 %
Neutrophils Absolute: 7.8 10*3/uL — ABNORMAL HIGH (ref 1.4–7.0)
Neutrophils: 76 %
Platelets: 495 10*3/uL — ABNORMAL HIGH (ref 150–450)
RBC: 3.28 x10E6/uL — ABNORMAL LOW (ref 3.77–5.28)
RDW: 14.4 % (ref 11.7–15.4)
WBC: 10.4 10*3/uL (ref 3.4–10.8)

## 2019-09-22 LAB — TSH: TSH: 1.67 u[IU]/mL (ref 0.450–4.500)

## 2019-09-22 LAB — LIPID PANEL
Chol/HDL Ratio: 2.7 ratio (ref 0.0–4.4)
Cholesterol, Total: 154 mg/dL (ref 100–199)
HDL: 58 mg/dL (ref >39)
LDL Chol Calc (NIH): 83 mg/dL (ref 0–99)
Triglycerides: 64 mg/dL (ref 0–149)
VLDL Cholesterol Cal: 13 mg/dL (ref 5–40)

## 2019-09-26 ENCOUNTER — Telehealth: Payer: Self-pay | Admitting: *Deleted

## 2019-09-26 MED ORDER — ATORVASTATIN CALCIUM 10 MG PO TABS: 10.0000 mg | ORAL_TABLET | Freq: Every day | ORAL | 2 refills | Status: AC

## 2019-09-26 NOTE — Telephone Encounter (Signed)
Patient's sister Hassan Rowan returning call for results Please call [phone]

## 2019-09-26 NOTE — Telephone Encounter (Signed)
Kaitlyn Gonzalez, ok per DPR. She verbalized understanding of patient's results and plan of care to start Lipitor 10 mg daily, as well as follow up with PCP regarding anemia and leg weakness. We reviewed upcoming appointments and patient already has PCP appointment. I also resent e-mail request for Hassan Rowan to set up MyChart for patient. She was appreciative. Rx sent to pharmacy.

## 2019-09-26 NOTE — Telephone Encounter (Signed)
-----   Message from Nelva Bush, MD sent at 09/23/2019  1:05 PM EST ----- Please let Ms. Belmarez know that her labs show modest decline in her hemoglobin since March.  This could be contributing to some of her weakness.  Her kidney function, thyroid function, and electrolytes are normal.  Her LDL is a little bit above goal at 83.  I recommend that we add atorvastatin 10 mg daily, given history of stroke.  Ms. Obara should contact her PCPs office to be seen as soon as possible for further evaluation of her anemia and leg weakness.  We will follow-up with her again after completion of the echocardiogram and carotid Dopplers.

## 2019-09-26 NOTE — Telephone Encounter (Signed)
No answer or VM on patient's number.  Left messages to call back on sister and son phone numbers.

## 2019-09-28 ENCOUNTER — Encounter: Payer: Self-pay | Admitting: Family Medicine

## 2019-09-28 ENCOUNTER — Other Ambulatory Visit: Payer: Self-pay

## 2019-09-28 ENCOUNTER — Ambulatory Visit (INDEPENDENT_AMBULATORY_CARE_PROVIDER_SITE_OTHER): Payer: Medicare Other | Admitting: Family Medicine

## 2019-09-28 DIAGNOSIS — D473 Essential (hemorrhagic) thrombocythemia: Secondary | ICD-10-CM | POA: Diagnosis not present

## 2019-09-28 DIAGNOSIS — E43 Unspecified severe protein-calorie malnutrition: Secondary | ICD-10-CM | POA: Diagnosis not present

## 2019-09-28 DIAGNOSIS — E559 Vitamin D deficiency, unspecified: Secondary | ICD-10-CM | POA: Diagnosis not present

## 2019-09-28 DIAGNOSIS — G8191 Hemiplegia, unspecified affecting right dominant side: Secondary | ICD-10-CM

## 2019-09-28 DIAGNOSIS — R531 Weakness: Secondary | ICD-10-CM | POA: Diagnosis not present

## 2019-09-28 DIAGNOSIS — D649 Anemia, unspecified: Secondary | ICD-10-CM | POA: Diagnosis not present

## 2019-09-28 DIAGNOSIS — Z9181 History of falling: Secondary | ICD-10-CM | POA: Diagnosis not present

## 2019-09-28 DIAGNOSIS — R296 Repeated falls: Secondary | ICD-10-CM | POA: Diagnosis not present

## 2019-09-28 DIAGNOSIS — D75839 Thrombocytosis, unspecified: Secondary | ICD-10-CM

## 2019-09-28 NOTE — Progress Notes (Signed)
Name: Kaitlyn Gonzalez   MRN: [id number]    DOB: 1936-08-22   Date:09/28/2019       Progress Note  Subjective:    Chief Complaint  Chief Complaint  Patient presents with  . Anemia  . Extremity Weakness    unable to walk without assistance, recurrent falls    I connected with  Kaitlyn Gonzalez  on 09/28/19 at  3:00 PM EST by a video enabled telemedicine application and verified that I am speaking with the correct person using two identifiers.  I discussed the limitations of evaluation and management by telemedicine and the availability of in person appointments. The patient expressed understanding and agreed to proceed. Staff also discussed with the patient that there may be a patient responsible charge related to this service. Patient Location: home Provider Location: [facility] clinic Additional Individuals present: Pt's son, Maricela Curet 25th 10  HPI Patient presents via virtual visit with her son Kaitlyn Gonzalez, complaining of weakness.  Concerned that she has been unable to walk without assistance and has been at risk of falling.  They state that weakness is not new, but it does seem harder for her to get around.   Right leg weakness, she has hx of stroke with right sided hemiparesis, stroke was a year ago, she saw neurology, isn't seeing them now.  They are not sure if she did neuro rehab.  she sometimes has worse weakness and in the last 30 days she's had 2 falls, after trying to get up out of bed and transfer to the bedside commode.  Floors are wood, son says she slipped.  Sometimes she is fine though. She has cold intolerance,  She denies CP SOB palpitations, rapid heart rate, pallor No weight changes  History is given by the son and the patient is lying in bed answering questions in 1-2 word answers.  Patient earlier this year had a hospital admission for symptomatic anemia and was transfused and also saw GI was found to have a negative colonoscopy -this occurred in February 17 January 2019. Her baseline hemoglobin during most of 2019 was 8.1-9.2, 12/29/2018 she presented to clinic with her PCP for routine follow-up and Dr. Sanda Klein initiated anemia work-up after seeing it may not have been previously completed.  Hemoglobin was decreased to 6 and she was instructed to go to the ER her son agreed and took her there.  She was admitted on 12/30/2018 and discharged on 12/31/2018.  GI did EGD and colonoscopy which was negative except for grade 2 internal hemorrhoids and diverticula  Copy from discharge summary is below  *Rectal bleeding with acute blood loss anemia.   From hemorrhoids  Status post 2 units of blood transfusion  Hemoglobin 6.7 following transfusion 9.7-10.6 , no other episodes of bleeding  Hemodynamically stable  EGD and colonoscopy done on December 31, 2018 by Dr. Allen Norris, EGD was normal colonoscopy revealed hemorrhoids.  GI is recommending outpatient video capsule study in 1 to 2 weeks  Okay to resume aspirin at discharge patient from GI standpoint   She did follow-up with Dr. Allen Norris outpt on 01/20/2019, her hemoglobin returned to normal at that appointment was 12.3, and she is supposed to do a capsule endoscopy. Patient may be lost to this follow-up study due to Covid pandemic I do not see any other gastroenterology notes or procedures  Ms. Alsman did go to cardiology for evaluation about 1 week ago did complain of leg pain, Dr. Saunders Revel did advise her to keep  taking aspirin 81 mg daily, plan to work-up carotid stenosis -patient labs were done by cardiology and atorvastatin was added with her history of stroke.  Labs showed decrease in her hemoglobin from 12.3 in March 2020 to 9.0 Sep 21, 2019, and PCP follow-up was recommended for leg weakness.    Of note patient also has history of protein calorie malnutrition which been severe in the past.  The patient is not moving much in her bed during the virtual encounter, but she is answering questions and is oriented to person place and  thing at her baseline per her family members.  She denies any chest pain, syncope, pallor, jaundice, change in shortness of breath.  She denies any weight changes, states she is drinking boost.    Patient Active Problem List   Diagnosis Date Noted  . Rectal bleeding 12/30/2018  . DNR (do not resuscitate) 06/30/2018  . Vitamin B12 deficiency 06/30/2018  . Protein-calorie malnutrition, severe 06/11/2018  . Anemia 06/09/2018  . AAA (abdominal aortic aneurysm) without rupture ([place]) 04/28/2018  . Atherosclerosis of abdominal aorta ([place]) 03/17/2018  . Bullous emphysema ([place]) 03/03/2018  . Abnormal chest CT 03/03/2018  . Dilatation of aorta (HCC) 02/15/2018  . Adynamia 01/20/2018  . Blind right eye 01/20/2018  . Left leg pain 01/20/2018  . Anterolisthesis 01/20/2018  . Chronic left hip pain 01/20/2018  . Osteoporosis 01/20/2018  . Tobacco abuse 01/20/2018  . History of stroke 09/09/2017  . Sacral fracture ([place]) 09/09/2017  . Hyperglycemia 02/16/2017  . Tachycardia with 100 - 120 beats per minute 04/09/2016  . Hypertension 02/15/2016  . Peripheral vascular disease ([place]) 02/15/2016  . Dyslipidemia 02/15/2016  . History of stroke with residual effects 02/15/2016  . Heart murmur on physical examination 02/15/2016    Social History   Tobacco Use  . Smoking status: Current Every Day Smoker    Packs/day: 0.50    Years: 62.00    Pack years: 31.00    Types: Cigarettes    Start date: 11/18/1955  . Smokeless tobacco: Never Used  . Tobacco comment: Down to about 1/2 pack a day from 2ppd 03/03/2018  Substance Use Topics  . Alcohol use: No    Alcohol/week: 0.0 standard drinks     Current Outpatient Medications:  .  aspirin EC 81 MG tablet, Take 81 mg by mouth daily., Disp: , Rfl:  .  Aspirin-Salicylamide-Caffeine (BC HEADACHE) 325-95-16 MG TABS, Take 1 packet by mouth as directed., Disp: , Rfl:  .  atorvastatin (LIPITOR) 10 MG tablet, Take 1 tablet (10 mg total) by mouth daily at 6 PM.,  Disp: 90 tablet, Rfl: 2 .  Cyanocobalamin (VITAMIN B-12) 500 MCG SUBL, One tablet under the tongue, dissolved; once a day, Disp: 30 tablet, Rfl: 12 .  ferrous sulfate 325 (65 FE) MG EC tablet, Take 1 tablet (325 mg total) by mouth daily with breakfast., Disp: 60 tablet, Rfl: 0 .  docusate sodium (COLACE) 100 MG capsule, Take 1 capsule (100 mg total) by mouth daily. (Patient not taking: Reported on 01/20/2019), Disp: 10 capsule, Rfl: 0 .  Vitamin D, Ergocalciferol, (DRISDOL) 1.25 MG (50000 UT) CAPS capsule, Take 1 capsule (50,000 Units total) by mouth every 7 (seven) days. (Patient not taking: Reported on 09/21/2019), Disp: 4 capsule, Rfl: 2  No Known Allergies  I personally reviewed active problem list, medication list, allergies, family history, social history, health maintenance, notes from last several encounters, lab results, imaging with the patient/caregiver today.  Review of Systems  Constitutional: Negative.  HENT: Negative.   Eyes: Negative.   Respiratory: Negative.   Cardiovascular: Negative.   Gastrointestinal: Negative.   Endocrine: Negative.   Genitourinary: Negative.   Musculoskeletal: Positive for gait problem.  Skin: Negative.   Allergic/Immunologic: Negative.   Neurological: Positive for weakness.  Hematological: Negative.   Psychiatric/Behavioral: Negative.   All other systems reviewed and are negative.    Objective:   Virtual encounter, vitals limited, only able to obtain the following There were no vitals filed for this visit. There is no height or weight on file to calculate BMI. Nursing Note and Vital Signs reviewed.  Physical Exam Vitals signs and nursing note reviewed.  Constitutional:      General: She is not in acute distress.    Appearance: She is well-developed. She is not toxic-appearing or diaphoretic.     Comments: Elderly thin appearing female laying in bed, alert  HENT:     Head: Normocephalic and atraumatic.  Eyes:     General:         Right eye: No discharge.        Left eye: No discharge.     Conjunctiva/sclera: Conjunctivae normal.  Neck:     Trachea: No tracheal deviation.  Pulmonary:     Effort: Pulmonary effort is normal. No respiratory distress.     Breath sounds: No stridor.  Skin:    Findings: No rash.  Neurological:     Mental Status: She is alert. Mental status is at baseline.     Motor: No abnormal muscle tone.     Coordination: Coordination normal.     Comments: Unable to assess gait or strength, normal movement and coordination of her upper extremities from what I can see during the encounter  Psychiatric:        Behavior: Behavior normal.     PE limited by telephone encounter  No results found for this or any previous visit (from the past 72 hour(s)).  Assessment and Plan:     ICD-10-CM   1. Normocytic anemia  D64.9 TSH    CBC +diff and smear    Iron, TIBC and Ferritin Panel    B12 and Folate Panel    CMP w GFR   Hgb went from ~12 to 9, earlier this year acute drop in H/H w/o GI bleed, pt admitted and transfused, lab were stable, but now lower again  2. Hemiparesis of right dominant side, unspecified hemiparesis etiology (HCC)  G81.91    want to get pt to neuro rehab, but with weakness and recurrent anemia, want to do further labs and in person eval first  3. Multiple falls  R29.6    2 recent falls sentences she has slipped on the floor, need in person evaluation and would like to send to rehab/pt  4. At high risk for falls  Z91.81   5. Protein-calorie malnutrition, severe  E43 Vit D    Mag    CMP w GFR   They deny any further weight loss says she is supplementing with boost and Ensure check labs for protein  6. Weakness generalized  R53.1 TSH    CBC +diff and smear    Iron, TIBC and Ferritin Panel    B12 and Folate Panel    Vit D    Mag    CMP w GFR   unclear etiology?  From malnutrition, anemia or other etiology?  No other associated symptoms or complaints  7. Thrombocytosis (HCC)   D47.3 CBC +diff and smear  with last weeks labs  8. Vitamin D deficiency  E55.9 Vit D   severe, last was 12/2018, Vit D was 5, recheck     -Red flags and when to present for emergency care or RTC including fever >101.59F, chest pain, shortness of breath, new/worsening/un-resolving symptoms,  reviewed with patient at time of visit. Follow up and care instructions discussed and provided in AVS. - I discussed the assessment and treatment plan with the patient. The patient was provided an opportunity to ask questions and all were answered. The patient agreed with the plan and demonstrated an understanding of the instructions.  I provided 20 minutes of non-face-to-face time during this encounter.  Extensive chart review done for multiple encounters with specialist and with hospitalization, procedures lab trends and past office visits.  Patient is new to me, history is somewhat difficult to obtain with virtual encounter and with patient answering questions in the background and her son answering other questions.  I have asked him to come in for labs and for in person physical exam so I can assess her strength, do a cardiovascular exam see if anything is suspicious for hypokalemia or worsening anemia.  Would also like to redo the anemia work-up with worsening blood counts from March to November this year they deny any melena or hematochezia.   Delsa Grana, PA-C 09/28/19 3:27 PM

## 2019-10-04 DIAGNOSIS — D649 Anemia, unspecified: Secondary | ICD-10-CM | POA: Diagnosis not present

## 2019-10-04 DIAGNOSIS — E46 Unspecified protein-calorie malnutrition: Secondary | ICD-10-CM | POA: Diagnosis not present

## 2019-10-04 DIAGNOSIS — R531 Weakness: Secondary | ICD-10-CM | POA: Diagnosis not present

## 2019-10-04 DIAGNOSIS — D473 Essential (hemorrhagic) thrombocythemia: Secondary | ICD-10-CM | POA: Diagnosis not present

## 2019-10-04 DIAGNOSIS — E43 Unspecified severe protein-calorie malnutrition: Secondary | ICD-10-CM | POA: Diagnosis not present

## 2019-10-04 DIAGNOSIS — E559 Vitamin D deficiency, unspecified: Secondary | ICD-10-CM | POA: Diagnosis not present

## 2019-10-05 LAB — CBC (INCLUDES DIFF/PLT) WITH PATHOLOGIST REVIEW
Absolute Monocytes: 534 cells/uL (ref 200–950)
Basophils Absolute: 18 cells/uL (ref 0–200)
Basophils Relative: 0.2 %
Eosinophils Absolute: 74 cells/uL (ref 15–500)
Eosinophils Relative: 0.8 %
HCT: 25.3 % — ABNORMAL LOW (ref 35.0–45.0)
Hemoglobin: 7.8 g/dL — ABNORMAL LOW (ref 11.7–15.5)
Lymphs Abs: 1113 cells/uL (ref 850–3900)
MCH: 26.4 pg — ABNORMAL LOW (ref 27.0–33.0)
MCHC: 30.8 g/dL — ABNORMAL LOW (ref 32.0–36.0)
MCV: 85.5 fL (ref 80.0–100.0)
MPV: 9.6 fL (ref 7.5–12.5)
Monocytes Relative: 5.8 %
Neutro Abs: 7461 cells/uL (ref 1500–7800)
Neutrophils Relative %: 81.1 %
Platelets: 465 10*3/uL — ABNORMAL HIGH (ref 140–400)
RBC: 2.96 10*6/uL — ABNORMAL LOW (ref 3.80–5.10)
RDW: 14.6 % (ref 11.0–15.0)
Total Lymphocyte: 12.1 %
WBC: 9.2 10*3/uL (ref 3.8–10.8)

## 2019-10-05 LAB — TSH: TSH: 2.16 mIU/L (ref 0.40–4.50)

## 2019-10-05 LAB — IRON,TIBC AND FERRITIN PANEL
%SAT: 4 % (calc) — ABNORMAL LOW (ref 16–45)
Ferritin: 10 ng/mL — ABNORMAL LOW (ref 16–288)
Iron: 16 ug/dL — ABNORMAL LOW (ref 45–160)
TIBC: 428 mcg/dL (calc) (ref 250–450)

## 2019-10-05 LAB — COMPLETE METABOLIC PANEL WITH GFR
AG Ratio: 1.2 (calc) (ref 1.0–2.5)
ALT: 6 U/L (ref 6–29)
AST: 10 U/L (ref 10–35)
Albumin: 4 g/dL (ref 3.6–5.1)
Alkaline phosphatase (APISO): 61 U/L (ref 37–153)
BUN/Creatinine Ratio: 23 (calc) — ABNORMAL HIGH (ref 6–22)
BUN: 13 mg/dL (ref 7–25)
CO2: 21 mmol/L (ref 20–32)
Calcium: 9.5 mg/dL (ref 8.6–10.4)
Chloride: 103 mmol/L (ref 98–110)
Creat: 0.57 mg/dL — ABNORMAL LOW (ref 0.60–0.88)
GFR, Est African American: 101 mL/min/{1.73_m2} (ref >=60)
GFR, Est Non African American: 87 mL/min/{1.73_m2} (ref >=60)
Globulin: 3.3 g/dL (calc) (ref 1.9–3.7)
Glucose, Bld: 102 mg/dL — ABNORMAL HIGH (ref 65–99)
Potassium: 3.4 mmol/L — ABNORMAL LOW (ref 3.5–5.3)
Sodium: 140 mmol/L (ref 135–146)
Total Bilirubin: 0.2 mg/dL (ref 0.2–1.2)
Total Protein: 7.3 g/dL (ref 6.1–8.1)

## 2019-10-05 LAB — MAGNESIUM: Magnesium: 1.8 mg/dL (ref 1.5–2.5)

## 2019-10-05 LAB — VITAMIN D 25 HYDROXY (VIT D DEFICIENCY, FRACTURES): Vit D, 25-Hydroxy: 34 ng/mL (ref 30–100)

## 2019-10-05 LAB — B12 AND FOLATE PANEL
Folate: 9.1 ng/mL
Vitamin B-12: 543 pg/mL (ref 200–1100)

## 2019-10-10 ENCOUNTER — Other Ambulatory Visit: Payer: Self-pay | Admitting: Family Medicine

## 2019-10-10 DIAGNOSIS — D509 Iron deficiency anemia, unspecified: Secondary | ICD-10-CM

## 2019-10-10 MED ORDER — FERROUS SULFATE 325 (65 FE) MG PO TBEC: 325.0000 mg | DELAYED_RELEASE_TABLET | Freq: Every day | ORAL | 1 refills | Status: AC

## 2019-10-20 ENCOUNTER — Telehealth: Payer: Self-pay

## 2019-10-20 DIAGNOSIS — R627 Adult failure to thrive: Secondary | ICD-10-CM

## 2019-10-20 DIAGNOSIS — D509 Iron deficiency anemia, unspecified: Secondary | ICD-10-CM

## 2019-10-20 NOTE — Telephone Encounter (Signed)
Referral placed.

## 2019-10-21 ENCOUNTER — Ambulatory Visit (INDEPENDENT_AMBULATORY_CARE_PROVIDER_SITE_OTHER): Payer: Medicare Other | Admitting: *Deleted

## 2019-10-21 DIAGNOSIS — D509 Iron deficiency anemia, unspecified: Secondary | ICD-10-CM

## 2019-10-21 DIAGNOSIS — R627 Adult failure to thrive: Secondary | ICD-10-CM

## 2019-10-24 NOTE — Patient Instructions (Signed)
Thank you allowing the Chronic Care Management Team to be a part of your care! It was a pleasure speaking with you today!  1. Please call this social worker with any questions or concerns related to patient's placement needs  CCM (Chronic Care Management) Team   Neldon Labella RN, BSN Nurse Care Coordinator  [phone]  Ruben Reason PharmD  Clinical Pharmacist  [phone]   [place], LCSW Clinical Social Worker [phone]  Goals Addressed            This Visit's Progress   . " I need to find palcement for my mother" (pt-stated)       Current Barriers:  . Level of care concerns  Clinical Social Work Clinical Goal(s):  Marland Kitchen Over the next 90 days, patient will work with SW to address concerns related to patient's level of care needs  Interventions: . Patient's caregiver interviewed and appropriate assessments performed . Confirmed that patient resides in a rental home with her 3 children, however the home is being sold and patient will need to find an alternate place to live within the next 3 months . Confirmed that patient's son would like patient to reside in long term care due to her medical needs . Provided patient with information about the long term care placement process and need to apply for long term medicaid . Provided education to patient/caregiver regarding level of care options. Nash Dimmer with patient's sister on 10/24/19, discussed placement process with her and preferred facilities  Patient Self Care Activities:  . Patient able to feed herself independently but unable to bathe and dress herself . Unable to perform IADLs independently . Knowledge deficit regarding long term care process  Initial goal documentation         The patient verbalized understanding of instructions provided today and declined a print copy of patient instruction materials.   The care management team will reach out to the patient again over the next 14 days.

## 2019-10-24 NOTE — Chronic Care Management (AMB) (Signed)
Chronic Care Management    Clinical Social Work Follow Up Note  10/24/2019 Name: Kaitlyn Gonzalez MRN: [id number] DOB: 11-28-1935  Kaitlyn Gonzalez is a 83 y.o. year old female who is a primary care patient of Delsa Grana, [place]. The CCM team was consulted for assistance with Level of Care Concerns.   Review of patient status, including review of consultants reports, other relevant assessments, and collaboration with appropriate care team members and the patient's provider was performed as part of comprehensive patient evaluation and provision of chronic care management services.    Ms. [place] son on DPR was given information about Chronic Care Management services today including:  1. CCM service includes personalized support from designated clinical staff supervised by her physician, including individualized plan of care and coordination with other care providers 2. 24/7 contact phone numbers for assistance for urgent and routine care needs. 3. Service will only be billed when office clinical staff spend 20 minutes or more in a month to coordinate care. 4. Only one practitioner may furnish and bill the service in a calendar month. 5. The patient may stop CCM services at any time (effective at the end of the month) by phone call to the office staff. 6. The patient will be responsible for cost sharing (co-pay) of up to 20% of the service fee (after annual deductible is met).  Patient's son on DPR agreed to services and verbal consent obtained on patient's behalf   Advanced Directives Status: <no information> See Care Plan for related entries.   Outpatient Encounter Medications as of 10/21/2019  Medication Sig Note  . aspirin EC 81 MG tablet Take 81 mg by mouth daily. 12/30/2018: Does not take regularly   . Aspirin-Salicylamide-Caffeine (BC HEADACHE) 325-95-16 MG TABS Take 1 packet by mouth as directed.   Marland Kitchen atorvastatin (LIPITOR) 10 MG tablet Take 1 tablet (10 mg total) by mouth  daily at 6 PM.   . Cyanocobalamin (VITAMIN B-12) 500 MCG SUBL One tablet under the tongue, dissolved; once a day 12/30/2018: Not yet started  . docusate sodium (COLACE) 100 MG capsule Take 1 capsule (100 mg total) by mouth daily. (Patient not taking: Reported on 01/20/2019)   . ferrous sulfate 325 (65 FE) MG EC tablet Take 1 tablet (325 mg total) by mouth daily with breakfast.   . Vitamin D, Ergocalciferol, (DRISDOL) 1.25 MG (50000 UT) CAPS capsule Take 1 capsule (50,000 Units total) by mouth every 7 (seven) days. (Patient not taking: Reported on 09/21/2019) 12/30/2018: Not yet started   No facility-administered encounter medications on file as of 10/21/2019.      Goals Addressed            This Visit's Progress   . " I need to find palcement for my mother" (pt-stated)       Current Barriers:  . Level of care concerns  Clinical Social Work Clinical Goal(s):  Marland Kitchen Over the next 90 days, patient will work with SW to address concerns related to patient's level of care needs  Interventions: . Patient's caregiver interviewed and appropriate assessments performed . Confirmed that patient resides in a rental home with her 3 children, however the home is being sold and patient will need to find an alternate place to live within the next 3 months . Confirmed that patient's son would like patient to reside in long term care due to her medical needs . Provided patient with information about the long term care placement process and need to apply for long  term medicaid . Provided education to patient/caregiver regarding level of care options. Nash Dimmer with patient's sister on 10/24/19, discussed placement process with her and preferred facilities  Patient Self Care Activities:  . Patient able to feed herself independently but unable to bathe and dress herself . Unable to perform IADLs independently . Knowledge deficit regarding long term care process  Initial goal documentation         Follow  Up Plan: SW will follow up with patient's son and sister by phone over the next 2 weeks regarding placement options   Devine Dant, [facility] Care Management [phone]

## 2019-10-25 ENCOUNTER — Ambulatory Visit: Payer: Self-pay | Admitting: *Deleted

## 2019-10-25 ENCOUNTER — Other Ambulatory Visit: Payer: Medicaid Other

## 2019-10-25 DIAGNOSIS — D509 Iron deficiency anemia, unspecified: Secondary | ICD-10-CM

## 2019-10-25 DIAGNOSIS — R627 Adult failure to thrive: Secondary | ICD-10-CM

## 2019-10-25 NOTE — Chronic Care Management (AMB) (Signed)
  Chronic Care Management    Clinical Social Work Follow Up Note  10/25/2019 Name: Kaitlyn Gonzalez MRN: [id number] DOB: [birth year]  Kaitlyn Gonzalez is a 83 y.o. year old female who is a primary care patient of Delsa Grana, [place]. The CCM team was consulted for assistance with Level of Care Concerns.   Review of patient status, including review of consultants reports, other relevant assessments, and collaboration with appropriate care team members and the patient's provider was performed as part of comprehensive patient evaluation and provision of chronic care management services.     Advanced Directives Status: <no information> See Care Plan for related entries.   Outpatient Encounter Medications as of 10/25/2019  Medication Sig Note  . aspirin EC 81 MG tablet Take 81 mg by mouth daily. 12/30/2018: Does not take regularly   . Aspirin-Salicylamide-Caffeine (BC HEADACHE) 325-95-16 MG TABS Take 1 packet by mouth as directed.   Marland Kitchen atorvastatin (LIPITOR) 10 MG tablet Take 1 tablet (10 mg total) by mouth daily at 6 PM.   . Cyanocobalamin (VITAMIN B-12) 500 MCG SUBL One tablet under the tongue, dissolved; once a day 12/30/2018: Not yet started  . docusate sodium (COLACE) 100 MG capsule Take 1 capsule (100 mg total) by mouth daily. (Patient not taking: Reported on 01/20/2019)   . ferrous sulfate 325 (65 FE) MG EC tablet Take 1 tablet (325 mg total) by mouth daily with breakfast.   . Vitamin D, Ergocalciferol, (DRISDOL) 1.25 MG (50000 UT) CAPS capsule Take 1 capsule (50,000 Units total) by mouth every 7 (seven) days. (Patient not taking: Reported on 09/21/2019) 12/30/2018: Not yet started   No facility-administered encounter medications on file as of 10/25/2019.      Goals Addressed            This Visit's Progress   . " I need to find palcement for my mother" (pt-stated)       Current Barriers:  . Level of care concerns  Clinical Social Work Clinical Goal(s):  Marland Kitchen Over the next 90 days,  patient will work with SW to address concerns related to patient's level of care needs  Interventions: . Request made to  patient's provider  to complete FL2 in order for placement search to begin . Fl2 faxed to [facility] [phone]  Patient Self Care Activities:  . Patient able to feed herself independently but unable to bathe and dress herself . Unable to perform IADLs independently . Knowledge deficit regarding long term care process  Initial goal documentation         Follow Up Plan: SW will follow up with patient by phone over the next 2 weeks regarding patient's placement needs   [place], [place] Worker  [facility] Care Management [phone]

## 2019-10-27 ENCOUNTER — Ambulatory Visit: Payer: Medicaid Other | Admitting: Internal Medicine

## 2019-10-31 ENCOUNTER — Telehealth: Payer: Medicaid Other

## 2019-11-01 ENCOUNTER — Ambulatory Visit: Payer: Medicare Other | Admitting: *Deleted

## 2019-11-01 DIAGNOSIS — R296 Repeated falls: Secondary | ICD-10-CM

## 2019-11-01 DIAGNOSIS — R627 Adult failure to thrive: Secondary | ICD-10-CM

## 2019-11-02 NOTE — Patient Instructions (Signed)
Thank you allowing the Chronic Care Management Team to be a part of your care! It was a pleasure speaking with you today!  1. Please call this social worker with any questions or concerns regarding patient's level of care needs.  CCM (Chronic Care Management) Team   Neldon Labella RN, BSN Nurse Care Coordinator  [phone]  Ruben Reason PharmD  Clinical Pharmacist  [phone]   [place], LCSW Clinical Social Worker [phone]  Goals Addressed            This Visit's Progress   . " I need to find placement for my mother" (pt-stated)       Current Barriers:  . Level of care concerns  Clinical Social Work Clinical Goal(s):  Marland Kitchen Over the next 90 days, patient will work with SW to address concerns related to patient's level of care needs  Interventions: . Continued to discuss placement process and facility preferences of [place], [facility], [facility], [facility]. . Reinforced need to apply for Long Term Care Medicaid to fund placement . Confirmed that patient's landlord is willing to allow time for patient to be placed in a facility before selling the home . Followed up on status of Fl2, per patient's provider it is complete at will fax back  Patient Self Care Activities:  . Patient able to feed herself independently but unable to bathe and dress herself . Unable to perform IADLs independently . Knowledge deficit regarding long term care process  Please see past updates related to this goal by clicking on the "Past Updates" button in the selected goal          The patient verbalized understanding of instructions provided today and declined a print copy of patient instruction materials.   Telephone follow up appointment with care management team member scheduled for:11/14/19

## 2019-11-02 NOTE — Chronic Care Management (AMB) (Signed)
  Chronic Care Management    Clinical Social Work Follow Up Note  11/02/2019 Name: Shawta Spearin MRN: [id number] DOB: [birth year]  Keyasia Baeley Orlikowski is a 83 y.o. year old female who is a primary care patient of Delsa Grana, [place]. The CCM team was consulted for assistance with Level of Care Concerns.   Review of patient status, including review of consultants reports, other relevant assessments, and collaboration with appropriate care team members and the patient's provider was performed as part of comprehensive patient evaluation and provision of chronic care management services.    Advanced Directives Status: <no information> See Care Plan for related entries.   Outpatient Encounter Medications as of 11/01/2019  Medication Sig Note  . aspirin EC 81 MG tablet Take 81 mg by mouth daily. 12/30/2018: Does not take regularly   . Aspirin-Salicylamide-Caffeine (BC HEADACHE) 325-95-16 MG TABS Take 1 packet by mouth as directed.   Marland Kitchen atorvastatin (LIPITOR) 10 MG tablet Take 1 tablet (10 mg total) by mouth daily at 6 PM.   . Cyanocobalamin (VITAMIN B-12) 500 MCG SUBL One tablet under the tongue, dissolved; once a day 12/30/2018: Not yet started  . docusate sodium (COLACE) 100 MG capsule Take 1 capsule (100 mg total) by mouth daily. (Patient not taking: Reported on 01/20/2019)   . ferrous sulfate 325 (65 FE) MG EC tablet Take 1 tablet (325 mg total) by mouth daily with breakfast.   . Vitamin D, Ergocalciferol, (DRISDOL) 1.25 MG (50000 UT) CAPS capsule Take 1 capsule (50,000 Units total) by mouth every 7 (seven) days. (Patient not taking: Reported on 09/21/2019) 12/30/2018: Not yet started   No facility-administered encounter medications on file as of 11/01/2019.     Goals Addressed            This Visit's Progress   . " I need to find placement for my mother" (pt-stated)       Current Barriers:  . Level of care concerns  Clinical Social Work Clinical Goal(s):  Marland Kitchen Over the next 90 days,  patient will work with SW to address concerns related to patient's level of care needs  Interventions: . Continued to discuss placement process and facility preferences of [facility], [facility], [facility], [facility]. . Reinforced need to apply for Long Term Care Medicaid to fund placement . Confirmed that patient's landlord is willing to allow time for patient to be placed in a facility before selling the home . Followed up on status of Fl2, per patient's provider it is complete at will fax back  Patient Self Care Activities:  . Patient able to feed herself independently but unable to bathe and dress herself . Unable to perform IADLs independently . Knowledge deficit regarding long term care process  Please see past updates related to this goal by clicking on the "Past Updates" button in the selected goal          Follow Up Plan: SW will follow up with patient by phone over the next 2 weeks   [place], [place] Worker  [facility] Care Management [phone]

## 2019-11-14 ENCOUNTER — Telehealth: Payer: Medicaid Other | Admitting: *Deleted

## 2019-11-15 ENCOUNTER — Ambulatory Visit: Payer: Self-pay | Admitting: *Deleted

## 2019-11-15 ENCOUNTER — Telehealth: Payer: Medicaid Other | Admitting: *Deleted

## 2019-11-15 DIAGNOSIS — R627 Adult failure to thrive: Secondary | ICD-10-CM

## 2019-11-15 DIAGNOSIS — R296 Repeated falls: Secondary | ICD-10-CM

## 2019-11-16 ENCOUNTER — Ambulatory Visit: Payer: Self-pay | Admitting: *Deleted

## 2019-11-16 DIAGNOSIS — R627 Adult failure to thrive: Secondary | ICD-10-CM

## 2019-11-16 DIAGNOSIS — R296 Repeated falls: Secondary | ICD-10-CM

## 2019-11-16 NOTE — Patient Instructions (Signed)
Thank you allowing the Chronic Care Management Team to be a part of your care! It was a pleasure speaking with you today!  1. Please call [facility], Otila Kluver at [phone] to completed screening for placement 2. Please call this social worker with any questions or concerns regarding patient's placement needs  CCM (Chronic Care Management) Team   Neldon Labella RN, BSN Nurse Care Coordinator  [phone]  Ruben Reason PharmD  Clinical Pharmacist  [phone]   [place], LCSW Clinical Social Worker [phone]  Goals Addressed            This Visit's Progress   . " I need to find placement for my mother" (pt-stated)       Current Barriers:  . Level of care concerns  Clinical Social Work Clinical Goal(s):  Marland Kitchen Over the next 90 days, patient will work with SW to address concerns related to patient's level of care needs  Interventions: . Fl2 faxed to [facility], [facility], [facility] and [facility] . [facility]-Not taking Admissions, [facility]-bed offer pending financial screening with patient's sister,  [place] message, [facility] bed offer pending financial screening with patient's sister . Confirmed that patient's sister prefers [facility], contact number provided to contact them to complete screening contact is Otila Kluver [phone]   Patient Self Care Activities:  . Patient able to feed herself independently but unable to bathe and dress herself . Unable to perform IADLs independently . Knowledge deficit regarding long term care process  Please see past updates related to this goal by clicking on the "Past Updates" button in the selected goal          The patient verbalized understanding of instructions provided today and declined a print copy of patient instruction materials.   The care management team will reach out to the patient again over the next 14 business days.

## 2019-11-16 NOTE — Patient Instructions (Signed)
Thank you allowing the Chronic Care Management Team to be a part of your care! It was a pleasure speaking with you today!  1. Please call this Education officer, museum with any questions or concerns regarding patient's placement process  CCM (Chronic Care Management) Team   Neldon Labella RN, BSN Nurse Care Coordinator  [phone]  Ruben Reason PharmD  Clinical Pharmacist  [phone]   [place], LCSW Clinical Social Worker [phone]  Goals Addressed            This Visit's Progress   . " I need to find placement for my mother" (pt-stated)       Current Barriers:  . Level of care concerns  Clinical Social Work Clinical Goal(s):  Marland Kitchen Over the next 90 days, patient will work with SW to address concerns related to patient's level of care needs Interventions: . Confirmed that patient's sister has accepted bed offer at [facility] . Confirmed that the Director of Nursing at [facility] will administer COVID test  . Confirmed that [facility] will assist family with the long term Medicaid application process . Confirmed that patient should be placed by the end of next week   Patient Self Care Activities:  . Patient able to feed herself independently but unable to bathe and dress herself . Unable to perform IADLs independently . Knowledge deficit regarding long term care process  Please see past updates related to this goal by clicking on the "Past Updates" button in the selected goal          The patient verbalized understanding of instructions provided today and declined a print copy of patient instruction materials.   The care management team will reach out to the patient again over the next 14 business days days.

## 2019-11-16 NOTE — Chronic Care Management (AMB) (Signed)
  Chronic Care Management    Clinical Social Work Follow Up Note  11/16/2019 Name: Tamra Vipperman MRN: [id number] DOB: 01/13/1936  Diahn Porfiria Almer is a 83 y.o. year old female who is a primary care patient of Delsa Grana, [place]. The CCM team was consulted for assistance with Level of Care Concerns.   Review of patient status, including review of consultants reports, other relevant assessments, and collaboration with appropriate care team members and the patient's provider was performed as part of comprehensive patient evaluation and provision of chronic care management services.    Advanced Directives Status: <no information> See Care Plan for related entries.    Outpatient Encounter Medications as of 11/15/2019  Medication Sig Note  . aspirin EC 81 MG tablet Take 81 mg by mouth daily. 12/30/2018: Does not take regularly   . Aspirin-Salicylamide-Caffeine (BC HEADACHE) 325-95-16 MG TABS Take 1 packet by mouth as directed.   Marland Kitchen atorvastatin (LIPITOR) 10 MG tablet Take 1 tablet (10 mg total) by mouth daily at 6 PM.   . Cyanocobalamin (VITAMIN B-12) 500 MCG SUBL One tablet under the tongue, dissolved; once a day 12/30/2018: Not yet started  . docusate sodium (COLACE) 100 MG capsule Take 1 capsule (100 mg total) by mouth daily. (Patient not taking: Reported on 01/20/2019)   . ferrous sulfate 325 (65 FE) MG EC tablet Take 1 tablet (325 mg total) by mouth daily with breakfast.   . Vitamin D, Ergocalciferol, (DRISDOL) 1.25 MG (50000 UT) CAPS capsule Take 1 capsule (50,000 Units total) by mouth every 7 (seven) days. (Patient not taking: Reported on 09/21/2019) 12/30/2018: Not yet started   No facility-administered encounter medications on file as of 11/15/2019.     Goals Addressed            This Visit's Progress   . " I need to find placement for my mother" (pt-stated)       Current Barriers:  . Level of care concerns  Clinical Social Work Clinical Goal(s):  Marland Kitchen Over the next 90 days,  patient will work with SW to address concerns related to patient's level of care needs  Interventions: . Fl2 faxed to [facility], [facility], [facility] and [facility] . [facility]-Not taking Admissions, [facility]-bed offer pending financial screening with patient's sister,  [place] message, [facility] bed offer pending financial screening with patient's sister . Confirmed that patient's sister prefers [facility], contact number provided to contact them to complete screening contact is Otila Kluver [phone]   Patient Self Care Activities:  . Patient able to feed herself independently but unable to bathe and dress herself . Unable to perform IADLs independently . Knowledge deficit regarding long term care process  Please see past updates related to this goal by clicking on the "Past Updates" button in the selected goal          Follow Up Plan: SW will follow up with patient by phone over the next 2 weeks regarding level of care options   Victoriah Wilds, [place] Worker  [facility] Care Management [phone]

## 2019-11-16 NOTE — Chronic Care Management (AMB) (Signed)
  Chronic Care Management    Clinical Social Work Follow Up Note  11/16/2019 Name: Kaitlyn Gonzalez MRN: [id number] DOB: Dec 19, 1935  Kaitlyn Gonzalez is a 83 y.o. year old female who is a primary care patient of Delsa Grana, [place]. The CCM team was consulted for assistance with Level of Care Concerns.   Review of patient status, including review of consultants reports, other relevant assessments, and collaboration with appropriate care team members and the patient's provider was performed as part of comprehensive patient evaluation and provision of chronic care management services.      Advanced Directives Status: <no information> See Care Plan for related entries.   Outpatient Encounter Medications as of 11/16/2019  Medication Sig Note  . aspirin EC 81 MG tablet Take 81 mg by mouth daily. 12/30/2018: Does not take regularly   . Aspirin-Salicylamide-Caffeine (BC HEADACHE) 325-95-16 MG TABS Take 1 packet by mouth as directed.   Marland Kitchen atorvastatin (LIPITOR) 10 MG tablet Take 1 tablet (10 mg total) by mouth daily at 6 PM.   . Cyanocobalamin (VITAMIN B-12) 500 MCG SUBL One tablet under the tongue, dissolved; once a day 12/30/2018: Not yet started  . docusate sodium (COLACE) 100 MG capsule Take 1 capsule (100 mg total) by mouth daily. (Patient not taking: Reported on 01/20/2019)   . ferrous sulfate 325 (65 FE) MG EC tablet Take 1 tablet (325 mg total) by mouth daily with breakfast.   . Vitamin D, Ergocalciferol, (DRISDOL) 1.25 MG (50000 UT) CAPS capsule Take 1 capsule (50,000 Units total) by mouth every 7 (seven) days. (Patient not taking: Reported on 09/21/2019) 12/30/2018: Not yet started   No facility-administered encounter medications on file as of 11/16/2019.     Goals Addressed            This Visit's Progress   . " I need to find placement for my mother" (pt-stated)       Current Barriers:  . Level of care concerns  Clinical Social Work Clinical Goal(s):  Marland Kitchen Over the next 90 days,  patient will work with SW to address concerns related to patient's level of care needs Interventions: . Confirmed bed offer with  [facility] . Confirmed that the Director of Nursing at [facility] can administer COVID test but she is unsure how soon she can get to patient's home to complete it . Confirmed that [facility] will assist family with the long term Medicaid application process and will arrange transportation to come pick her up . Confirmed that patient should be placed by the end of next week following negative COVID test . Contacted the health department, left VM with Claiborne Billings to request home visit to complete COVID test [phone]   Patient Self Care Activities:  . Patient able to feed herself independently but unable to bathe and dress herself . Unable to perform IADLs independently . Knowledge deficit regarding long term care process  Please see past updates related to this goal by clicking on the "Past Updates" button in the selected goal          Follow Up Plan: SW will follow up with patient by phone over the next 7-10 business days   Elliot Gurney, [place] Worker  [facility] Care Management [phone]

## 2019-11-16 NOTE — Chronic Care Management (AMB) (Signed)
  Chronic Care Management    Clinical Social Work Follow Up Note  11/16/2019 Name: Kaitlyn Gonzalez MRN: [id number] DOB: [birth year]  Kaitlyn Gonzalez is a 83 y.o. year old female who is a primary care patient of Delsa Grana, [place]. The CCM team was consulted for assistance with Level of Care Concerns.   Review of patient status, including review of consultants reports, other relevant assessments, and collaboration with appropriate care team members and the patient's provider was performed as part of comprehensive patient evaluation and provision of chronic care management services.     Advanced Directives Status: <no information> See Care Plan for related entries.   Outpatient Encounter Medications as of 11/16/2019  Medication Sig Note  . aspirin EC 81 MG tablet Take 81 mg by mouth daily. 12/30/2018: Does not take regularly   . Aspirin-Salicylamide-Caffeine (BC HEADACHE) 325-95-16 MG TABS Take 1 packet by mouth as directed.   Marland Kitchen atorvastatin (LIPITOR) 10 MG tablet Take 1 tablet (10 mg total) by mouth daily at 6 PM.   . Cyanocobalamin (VITAMIN B-12) 500 MCG SUBL One tablet under the tongue, dissolved; once a day 12/30/2018: Not yet started  . docusate sodium (COLACE) 100 MG capsule Take 1 capsule (100 mg total) by mouth daily. (Patient not taking: Reported on 01/20/2019)   . ferrous sulfate 325 (65 FE) MG EC tablet Take 1 tablet (325 mg total) by mouth daily with breakfast.   . Vitamin D, Ergocalciferol, (DRISDOL) 1.25 MG (50000 UT) CAPS capsule Take 1 capsule (50,000 Units total) by mouth every 7 (seven) days. (Patient not taking: Reported on 09/21/2019) 12/30/2018: Not yet started   No facility-administered encounter medications on file as of 11/16/2019.     Goals Addressed            This Visit's Progress   . " I need to find placement for my mother" (pt-stated)       Current Barriers:  . Level of care concerns  Clinical Social Work Clinical Goal(s):  Marland Kitchen Over the next 90 days,  patient will work with SW to address concerns related to patient's level of care needs Interventions: . Confirmed that patient's sister has accepted bed offer at [facility] . Confirmed that the Director of Nursing at [facility] will administer COVID test  . Confirmed that [facility] will assist family with the long term Medicaid application process . Confirmed that patient should be placed by the end of next week   Patient Self Care Activities:  . Patient able to feed herself independently but unable to bathe and dress herself . Unable to perform IADLs independently . Knowledge deficit regarding long term care process  Please see past updates related to this goal by clicking on the "Past Updates" button in the selected goal          Follow Up Plan: SW will follow up with patient by phone over the next 2 weeks regarding placement process   [place], [place] Worker  [facility] Care Management [phone]

## 2019-11-16 NOTE — Patient Instructions (Signed)
Thank you allowing the Chronic Care Management Team to be a part of your care! It was a pleasure speaking with you today!   CCM (Chronic Care Management) Team   Neldon Labella RN, BSN Nurse Care Coordinator  [phone]  Ruben Reason PharmD  Clinical Pharmacist  [phone]   [place], LCSW Clinical Social Worker [phone]  Goals Addressed            This Visit's Progress   . " I need to find placement for my mother" (pt-stated)       Current Barriers:  . Level of care concerns  Clinical Social Work Clinical Goal(s):  Marland Kitchen Over the next 90 days, patient will work with SW to address concerns related to patient's level of care needs Interventions: . Confirmed bed offer with  [facility] . Confirmed that the Director of Nursing at [facility] can administer COVID test but she is unsure how soon she can get to patient's home to complete it . Confirmed that [facility] will assist family with the long term Medicaid application process and will arrange transportation to come pick her up . Confirmed that patient should be placed by the end of next week following negative COVID test . Contacted the health department, left VM with Claiborne Billings to request home visit to complete COVID test [phone]   Patient Self Care Activities:  . Patient able to feed herself independently but unable to bathe and dress herself . Unable to perform IADLs independently . Knowledge deficit regarding long term care process  Please see past updates related to this goal by clicking on the "Past Updates" button in the selected goal          The  admissions director of [facility] verbalized understanding of instructions provided today and declined a print copy of patient instruction materials.   The care management team will reach out to the patient's family  again over the next 7-10 days days.

## 2019-11-17 ENCOUNTER — Ambulatory Visit: Payer: Self-pay | Admitting: *Deleted

## 2019-11-17 DIAGNOSIS — R296 Repeated falls: Secondary | ICD-10-CM

## 2019-11-17 DIAGNOSIS — R627 Adult failure to thrive: Secondary | ICD-10-CM

## 2019-11-17 NOTE — Chronic Care Management (AMB) (Signed)
  Chronic Care Management    Clinical Social Work Follow Up Note  11/17/2019 Name: Kaitlyn Gonzalez MRN: [id number] DOB: [birth year]  Kaitlyn Gonzalez is a 83 y.o. year old female who is a primary care patient of Delsa Grana, [place]. The CCM team was consulted for assistance with Level of Care Concerns.   Review of patient status, including review of consultants reports, other relevant assessments, and collaboration with appropriate care team members and the patient's provider was performed as part of comprehensive patient evaluation and provision of chronic care management services.    Advanced Directives Status: <no information> See Care Plan for related entries.   Outpatient Encounter Medications as of 11/17/2019  Medication Sig Note  . aspirin EC 81 MG tablet Take 81 mg by mouth daily. 12/30/2018: Does not take regularly   . Aspirin-Salicylamide-Caffeine (BC HEADACHE) 325-95-16 MG TABS Take 1 packet by mouth as directed.   Marland Kitchen atorvastatin (LIPITOR) 10 MG tablet Take 1 tablet (10 mg total) by mouth daily at 6 PM.   . Cyanocobalamin (VITAMIN B-12) 500 MCG SUBL One tablet under the tongue, dissolved; once a day 12/30/2018: Not yet started  . docusate sodium (COLACE) 100 MG capsule Take 1 capsule (100 mg total) by mouth daily. (Patient not taking: Reported on 01/20/2019)   . ferrous sulfate 325 (65 FE) MG EC tablet Take 1 tablet (325 mg total) by mouth daily with breakfast.   . Vitamin D, Ergocalciferol, (DRISDOL) 1.25 MG (50000 UT) CAPS capsule Take 1 capsule (50,000 Units total) by mouth every 7 (seven) days. (Patient not taking: Reported on 09/21/2019) 12/30/2018: Not yet started   No facility-administered encounter medications on file as of 11/17/2019.     Goals Addressed            This Visit's Progress   . " I need to find placement for my mother" (pt-stated)       Current Barriers:  . Level of care concerns  Clinical Social Work Clinical Goal(s):  Marland Kitchen Over the next 90 days,  patient will work with SW to address concerns related to patient's level of care needs Interventions:  . Phone call from [place] from the Health Department [phone] who agreed to complete COVID test at patient's home today for placement purposes . Contact information provided to [place] to complete COVID testing today . Phone call to patient's sister to inform her of plan for patient to be tested today . Phone call to Tammy in admissions at [facility] to inform them of COVID test being done   Patient Self Care Activities:  . Patient able to feed herself independently but unable to bathe and dress herself . Unable to perform IADLs independently . Knowledge deficit regarding long term care process  Please see past updates related to this goal by clicking on the "Past Updates" button in the selected goal          Follow Up Plan: SW will follow up with patient's sister by phone over the next 2 weeks regarding placement plans   [place], [place] Worker  [facility] Care [facility] [phone]

## 2019-11-17 NOTE — Patient Instructions (Signed)
Thank you allowing the Chronic Care Management Team to be a part of your care! It was a pleasure speaking with you today!  1. Please expect to have COVID testing today before 12pm  CCM (Chronic Care Management) Team   Neldon Labella RN, BSN Nurse Care Coordinator  [phone]  Ruben Reason PharmD  Clinical Pharmacist  [phone]   [place], LCSW Clinical Social Worker [phone]  Goals Addressed            This Visit's Progress   . " I need to find placement for my mother" (pt-stated)       Current Barriers:  . Level of care concerns  Clinical Social Work Clinical Goal(s):  Marland Kitchen Over the next 90 days, patient will work with SW to address concerns related to patient's level of care needs Interventions:  . Phone call from [place] from the Health Department [phone] who agreed to complete COVID test at patient's home today for placement purposes . Contact information provided to [place] to complete COVID testing today . Phone call to patient's sister to inform her of plan for patient to be tested today   Patient Self Care Activities:  . Patient able to feed herself independently but unable to bathe and dress herself . Unable to perform IADLs independently . Knowledge deficit regarding long term care process  Please see past updates related to this goal by clicking on the "Past Updates" button in the selected goal          The patient verbalized understanding of instructions provided today and declined a print copy of patient instruction materials.   The care management team will reach out to the patient's sister again over the next 14 days.

## 2019-11-21 ENCOUNTER — Ambulatory Visit: Payer: Self-pay | Admitting: *Deleted

## 2019-11-21 DIAGNOSIS — D509 Iron deficiency anemia, unspecified: Secondary | ICD-10-CM

## 2019-11-21 DIAGNOSIS — R627 Adult failure to thrive: Secondary | ICD-10-CM

## 2019-11-21 DIAGNOSIS — R296 Repeated falls: Secondary | ICD-10-CM

## 2019-11-21 NOTE — Patient Instructions (Signed)
Thank you allowing the Chronic Care Management Team to be a part of your care! It was a pleasure speaking with you today!  1. Please call this social worker once patient is officially placed at [facility] for [place] (Chronic Care Management) Team   Neldon Labella  RN, BSN Nurse Care Coordinator  [phone]  Ruben Reason PharmD  Clinical Pharmacist  [phone]   [place], LCSW Clinical Social Worker [phone]  Goals Addressed            This Visit's Progress   . " I need to find placement for my mother" (pt-stated)       Current Barriers:  . Level of care concerns  Clinical Social Work Clinical Goal(s):  Marland Kitchen Over the next 90 days, patient will work with SW to address concerns related to patient's level of care needs Interventions:  . Phone call from patient's sister to confirm negative COVID test results received . Confirmed that patient will be transported by non-emergent EMS to [facility] on 11/23/19 at 1 pm for long term care    Patient Self Care Activities:  . Patient able to feed herself independently but unable to bathe and dress herself . Unable to perform IADLs independently . Knowledge deficit regarding long term care process  Please see past updates related to this goal by clicking on the "Past Updates" button in the selected goal          The patient verbalized understanding of instructions provided today and declined a print copy of patient instruction materials.   No further follow up required: patient's sister will call this social worker to confirm patient's placement at [facility] on 11/23/19

## 2019-11-21 NOTE — Chronic Care Management (AMB) (Signed)
  Chronic Care Management    Clinical Social Work Follow Up Note  11/21/2019 Name: Kaitlyn Gonzalez MRN: [id number] DOB: December 02, 1935  Kaitlyn Gonzalez is a 84 y.o. year old female who is a primary care patient of Delsa Grana, [place]. The CCM team was consulted for assistance with Level of Care Concerns.   Review of patient status, including review of consultants reports, other relevant assessments, and collaboration with appropriate care team members and the patient's provider was performed as part of comprehensive patient evaluation and provision of chronic care management services.    Advanced Directives Status: <no information> See Care Plan for related entries.   Outpatient Encounter Medications as of 11/21/2019  Medication Sig Note  . aspirin EC 81 MG tablet Take 81 mg by mouth daily. 12/30/2018: Does not take regularly   . Aspirin-Salicylamide-Caffeine (BC HEADACHE) 325-95-16 MG TABS Take 1 packet by mouth as directed.   Marland Kitchen atorvastatin (LIPITOR) 10 MG tablet Take 1 tablet (10 mg total) by mouth daily at 6 PM.   . Cyanocobalamin (VITAMIN B-12) 500 MCG SUBL One tablet under the tongue, dissolved; once a day 12/30/2018: Not yet started  . docusate sodium (COLACE) 100 MG capsule Take 1 capsule (100 mg total) by mouth daily. (Patient not taking: Reported on 01/20/2019)   . ferrous sulfate 325 (65 FE) MG EC tablet Take 1 tablet (325 mg total) by mouth daily with breakfast.   . Vitamin D, Ergocalciferol, (DRISDOL) 1.25 MG (50000 UT) CAPS capsule Take 1 capsule (50,000 Units total) by mouth every 7 (seven) days. (Patient not taking: Reported on 09/21/2019) 12/30/2018: Not yet started   No facility-administered encounter medications on file as of 11/21/2019.     Goals Addressed            This Visit's Progress   . " I need to find placement for my mother" (pt-stated)       Current Barriers:  . Level of care concerns  Clinical Social Work Clinical Goal(s):  Marland Kitchen Over the next 90 days, patient  will work with SW to address concerns related to patient's level of care needs Interventions:  . Phone call from patient's sister to confirm negative COVID test results received . Confirmed that patient will be transported by non-emergent EMS to [facility] on 11/23/19 at 1 pm for long term care    Patient Self Care Activities:  . Patient able to feed herself independently but unable to bathe and dress herself . Unable to perform IADLs independently . Knowledge deficit regarding long term care process  Please see past updates related to this goal by clicking on the "Past Updates" button in the selected goal          Follow Up Plan: Client's sister will confirm placement with this social worker once placed on 11/23/19   Elliot Gurney, [place] Social Worker  [facility] [phone]

## 2019-11-22 ENCOUNTER — Telehealth: Payer: Medicaid Other

## 2019-11-23 ENCOUNTER — Encounter: Payer: Self-pay | Admitting: *Deleted

## 2019-11-23 ENCOUNTER — Ambulatory Visit (INDEPENDENT_AMBULATORY_CARE_PROVIDER_SITE_OTHER): Payer: Medicare Other | Admitting: *Deleted

## 2019-11-23 DIAGNOSIS — R627 Adult failure to thrive: Secondary | ICD-10-CM

## 2019-11-23 DIAGNOSIS — R531 Weakness: Secondary | ICD-10-CM

## 2019-11-23 DIAGNOSIS — D509 Iron deficiency anemia, unspecified: Secondary | ICD-10-CM

## 2019-11-23 NOTE — Patient Instructions (Signed)
Thank you allowing the Chronic Care Management Team to be a part of your care! It was a pleasure speaking with you today!  1. Please call this social worker with any community resource needs in the future  CCM (Chronic Care Management) Team   Neldon Labella RN, BSN Nurse Care Coordinator  [phone]  Ruben Reason PharmD  Clinical Pharmacist  [phone]   [place], LCSW Clinical Social Worker [phone]  Goals Addressed            This Visit's Progress   . " I need to find placement for my mother" (pt-stated)       Current Barriers:  . Level of care concerns  Clinical Social Work Clinical Goal(s):  Marland Kitchen Over the next 90 days, patient will work with SW to address concerns related to patient's level of care needs Interventions:  . Phone call from patient's sister confirming that patient and patient's children minus son [name] are now refusing to allow placement for patient . Confirmed that patient's rental home has been sold and patient and her children will have to move by 12/18/19 . Placement at [facility] is no longer at this time . Confirmed that patient's adult children will now take over care of patient and will move her with them at the end of the month, however no concrete plans have been  made regarding the new residence on 12/18/19 . Encouraged patient's sister to contact this social worker if placement needs are needed in the future.    Patient Self Care Activities:  . Patient able to feed herself independently but unable to bathe and dress herself . Unable to perform IADLs independently . Knowledge deficit regarding long term care process  Please see past updates related to this goal by clicking on the "Past Updates" button in the selected goal          The patient verbalized understanding of instructions provided today and declined a print copy of patient instruction materials.   No further follow up required: please call this social  worker with any community resource needs in the future

## 2019-11-23 NOTE — Progress Notes (Signed)
This encounter was created in error - please disregard.

## 2019-11-23 NOTE — Chronic Care Management (AMB) (Signed)
  Chronic Care Management    Clinical Social Work Follow Up Note  11/23/2019 Name: Nollie Lujan MRN: [id number] DOB: Oct 28, 1936  Toya Yecenia Lukasik is a 84 y.o. year old female who is a primary care patient of Delsa Grana, [place]. The CCM team was consulted for assistance with Level of Care Concerns.   Review of patient status, including review of consultants reports, other relevant assessments, and collaboration with appropriate care team members and the patient's provider was performed as part of comprehensive patient evaluation and provision of chronic care management services.     Advanced Directives Status: <no information> See Care Plan for related entries.   Outpatient Encounter Medications as of 11/23/2019  Medication Sig Note  . aspirin EC 81 MG tablet Take 81 mg by mouth daily. 12/30/2018: Does not take regularly   . Aspirin-Salicylamide-Caffeine (BC HEADACHE) 325-95-16 MG TABS Take 1 packet by mouth as directed.   Marland Kitchen atorvastatin (LIPITOR) 10 MG tablet Take 1 tablet (10 mg total) by mouth daily at 6 PM.   . Cyanocobalamin (VITAMIN B-12) 500 MCG SUBL One tablet under the tongue, dissolved; once a day 12/30/2018: Not yet started  . docusate sodium (COLACE) 100 MG capsule Take 1 capsule (100 mg total) by mouth daily. (Patient not taking: Reported on 01/20/2019)   . ferrous sulfate 325 (65 FE) MG EC tablet Take 1 tablet (325 mg total) by mouth daily with breakfast.   . Vitamin D, Ergocalciferol, (DRISDOL) 1.25 MG (50000 UT) CAPS capsule Take 1 capsule (50,000 Units total) by mouth every 7 (seven) days. (Patient not taking: Reported on 09/21/2019) 12/30/2018: Not yet started   No facility-administered encounter medications on file as of 11/23/2019.     Goals Addressed            This Visit's Progress   . " I need to find placement for my mother" (pt-stated)       Current Barriers:  . Level of care concerns  Clinical Social Work Clinical Goal(s):  Marland Kitchen Over the next 90 days, patient  will work with SW to address concerns related to patient's level of care needs Interventions:  . Phone call from patient's sister confirming that patient and patient's children minus son [name] are now refusing to allow placement for patient . Confirmed that patient's rental home has been sold and patient and her children will have to move by 12/18/19 . Placement at [facility] is no longer at this time . Confirmed that patient's adult children will now take over care of patient and will move her with them at the end of the month, however no concrete plans have been  made regarding the new residence on 12/18/19 . Encouraged patient's sister to contact this social worker if placement needs are needed in the future.    Patient Self Care Activities:  . Patient able to feed herself independently but unable to bathe and dress herself . Unable to perform IADLs independently . Knowledge deficit regarding long term care process  Please see past updates related to this goal by clicking on the "Past Updates" button in the selected goal          Follow Up Plan: Client's sister and son will call this social worker with any future placement needs   Elliot Gurney, [facility] Care [facility] [phone]

## 2019-11-24 ENCOUNTER — Telehealth: Payer: Medicaid Other

## 2019-11-30 ENCOUNTER — Ambulatory Visit: Payer: Self-pay | Admitting: *Deleted

## 2019-11-30 DIAGNOSIS — I10 Essential (primary) hypertension: Secondary | ICD-10-CM

## 2019-11-30 DIAGNOSIS — Z9181 History of falling: Secondary | ICD-10-CM

## 2019-11-30 DIAGNOSIS — R627 Adult failure to thrive: Secondary | ICD-10-CM

## 2019-11-30 DIAGNOSIS — D509 Iron deficiency anemia, unspecified: Secondary | ICD-10-CM

## 2019-11-30 DIAGNOSIS — R531 Weakness: Secondary | ICD-10-CM

## 2019-11-30 DIAGNOSIS — Z8673 Personal history of transient ischemic attack (TIA), and cerebral infarction without residual deficits: Secondary | ICD-10-CM

## 2019-11-30 NOTE — Patient Instructions (Signed)
Thank you allowing the Chronic Care Management Team to be a part of your care! It was a pleasure speaking with you today!  1. Please follow up with [facility] regarding placement documentation requirements.  CCM (Chronic Care Management) Team   Neldon Labella RN, BSN Nurse Care Coordinator  [phone]  Ruben Reason PharmD  Clinical Pharmacist  [phone]   [place], LCSW Clinical Social Worker [phone]  Goals Addressed            This Visit's Progress   . " I need to find placement for my mother" (pt-stated)       Current Barriers:  . Level of care concerns  Clinical Social Work Clinical Goal(s):  Marland Kitchen Over the next 90 days, patient will work with SW to address concerns related to patient's level of care needs Interventions:  . Phone call from patient's sister confirming that patient and patient's children have now decided that they want placement again . Confirmed that patient's rental home has been sold and patient and her children will have to move by 12/18/19 . Placement at [facility] is confirmed to remain available . Confirmed that patient will need another COVID Test before admission . Encouraged patient's sister to contact this social worker if placement needs are needed in the future.    Patient Self Care Activities:  . Patient able to feed herself independently but unable to bathe and dress herself . Unable to perform IADLs independently . Knowledge deficit regarding long term care process  Please see past updates related to this goal by clicking on the "Past Updates" button in the selected goal          The patient verbalized understanding of instructions provided today and declined a print copy of patient instruction materials.   Telephone follow up appointment with care management team member scheduled for: 12/01/19 The patient's sister will call [facility] as advised to follow up on placement doucmentation requirements.

## 2019-11-30 NOTE — Chronic Care Management (AMB) (Signed)
  Chronic Care Management    Clinical Social Work Follow Up Note  11/30/2019 Name: Kaitlyn Gonzalez MRN: [id number] DOB: [birth year]  Kaitlyn Gonzalez is a 84 y.o. year old female who is a primary care patient of Delsa Grana, [place]. The CCM team was consulted for assistance with Level of Care Concerns.   Review of patient status, including review of consultants reports, other relevant assessments, and collaboration with appropriate care team members and the patient's provider was performed as part of comprehensive patient evaluation and provision of chronic care management services.    Advanced Directives Status: <no information> See Care Plan for related entries.   Outpatient Encounter Medications as of 11/30/2019  Medication Sig Note  . aspirin EC 81 MG tablet Take 81 mg by mouth daily. 12/30/2018: Does not take regularly   . Aspirin-Salicylamide-Caffeine (BC HEADACHE) 325-95-16 MG TABS Take 1 packet by mouth as directed.   Marland Kitchen atorvastatin (LIPITOR) 10 MG tablet Take 1 tablet (10 mg total) by mouth daily at 6 PM.   . Cyanocobalamin (VITAMIN B-12) 500 MCG SUBL One tablet under the tongue, dissolved; once a day 12/30/2018: Not yet started  . docusate sodium (COLACE) 100 MG capsule Take 1 capsule (100 mg total) by mouth daily. (Patient not taking: Reported on 01/20/2019)   . ferrous sulfate 325 (65 FE) MG EC tablet Take 1 tablet (325 mg total) by mouth daily with breakfast.   . Vitamin D, Ergocalciferol, (DRISDOL) 1.25 MG (50000 UT) CAPS capsule Take 1 capsule (50,000 Units total) by mouth every 7 (seven) days. (Patient not taking: Reported on 09/21/2019) 12/30/2018: Not yet started   No facility-administered encounter medications on file as of 11/30/2019.     Goals Addressed            This Visit's Progress   . " I need to find placement for my mother" (pt-stated)       Current Barriers:  . Level of care concerns  Clinical Social Work Clinical Goal(s):  Marland Kitchen Over the next 90 days,  patient will work with SW to address concerns related to patient's level of care needs Interventions:  . Phone call from patient's sister confirming that patient and patient's children have now decided that they want placement again . Confirmed that patient's rental home has been sold and patient and her children will have to move by 12/18/19 . Placement at [facility] is confirmed to remain available . Confirmed that patient will need another COVID Test before admission . Encouraged patient's sister to contact this social worker if placement needs are needed in the future.    Patient Self Care Activities:  . Patient able to feed herself independently but unable to bathe and dress herself . Unable to perform IADLs independently . Knowledge deficit regarding long term care process  Please see past updates related to this goal by clicking on the "Past Updates" button in the selected goal          Follow Up Plan: SW will follow up with patient by phone over the next 7-10 business days   Elliot Gurney, [place] Worker  [facility] Care [facility] [phone]

## 2019-11-30 NOTE — Chronic Care Management (AMB) (Addendum)
   Chronic Care Management    Clinical Social Work Follow Up Note  11/30/2019 Name: Kaitlyn Gonzalez MRN: [id number] DOB: 06-30-1936  Kaitlyn Gonzalez is a 84 y.o. year old female who is a primary care patient of Delsa Grana, [place]. The CCM team was consulted for assistance with Level of Care Concerns.   Review of patient status, including review of consultants reports, other relevant assessments, and collaboration with appropriate care team members and the patient's provider was performed as part of comprehensive patient evaluation and provision of chronic care management services.     Advanced Directives Status: <no information> See Care Plan for related entries.   Outpatient Encounter Medications as of 11/30/2019  Medication Sig Note   aspirin EC 81 MG tablet Take 81 mg by mouth daily. 12/30/2018: Does not take regularly    Aspirin-Salicylamide-Caffeine (BC HEADACHE) 325-95-16 MG TABS Take 1 packet by mouth as directed.    atorvastatin (LIPITOR) 10 MG tablet Take 1 tablet (10 mg total) by mouth daily at 6 PM.    Cyanocobalamin (VITAMIN B-12) 500 MCG SUBL One tablet under the tongue, dissolved; once a day 12/30/2018: Not yet started   docusate sodium (COLACE) 100 MG capsule Take 1 capsule (100 mg total) by mouth daily. (Patient not taking: Reported on 01/20/2019)    ferrous sulfate 325 (65 FE) MG EC tablet Take 1 tablet (325 mg total) by mouth daily with breakfast.    Vitamin D, Ergocalciferol, (DRISDOL) 1.25 MG (50000 UT) CAPS capsule Take 1 capsule (50,000 Units total) by mouth every 7 (seven) days. (Patient not taking: Reported on 09/21/2019) 12/30/2018: Not yet started   No facility-administered encounter medications on file as of 11/30/2019.     Goals Addressed             This Visit's Progress    " I need to find placement for my mother" (pt-stated)       Current Barriers:  Level of care concerns  Clinical Social Work Clinical Goal(s):  Over the next 90 days, patient  will work with SW to address concerns related to patient's level of care needs Interventions:  Phone call from [facility] to confirm bed availability and need for a new Fl2 and COVID test.     Patient Self Care Activities:  Patient able to feed herself independently but unable to bathe and dress herself Unable to perform IADLs independently Knowledge deficit regarding long term care process  Please see past updates related to this goal by clicking on the "Past Updates" button in the selected goal           Follow Up Plan: SW will follow up with patient by phone over the next 2 weeks   [place], [place] Worker  [facility] [phone]

## 2019-11-30 NOTE — Patient Instructions (Signed)
Thank you allowing the Chronic Care Management Team to be a part of your care! It was a pleasure speaking with you today!  1. Please call this social worker with any questions/needs regarding patient's placement needs.  CCM (Chronic Care Management) Team   Neldon Labella RN, BSN Nurse Care Coordinator  [phone]  Ruben Reason PharmD  Clinical Pharmacist  [phone]   [place], LCSW Clinical Social Worker [phone]  Goals Addressed            This Visit's Progress   . " I need to find placement for my mother" (pt-stated)       Current Barriers:  . Level of care concerns  Clinical Social Work Clinical Goal(s):  Marland Kitchen Over the next 90 days, patient will work with SW to address concerns related to patient's level of care needs Interventions:  . Phone call from [facility] to confirm bed availability and need for a new Fl2 and COVID test.     Patient Self Care Activities:  . Patient able to feed herself independently but unable to bathe and dress herself . Unable to perform IADLs independently . Knowledge deficit regarding long term care process  Please see past updates related to this goal by clicking on the "Past Updates" button in the selected goal          The patient verbalized understanding of instructions provided today and declined a print copy of patient instruction materials.   The care management team will reach out to the patient again over the next 7-10 days.

## 2019-12-01 ENCOUNTER — Ambulatory Visit: Payer: Medicare Other | Admitting: *Deleted

## 2019-12-01 DIAGNOSIS — D509 Iron deficiency anemia, unspecified: Secondary | ICD-10-CM

## 2019-12-01 DIAGNOSIS — R627 Adult failure to thrive: Secondary | ICD-10-CM

## 2019-12-01 DIAGNOSIS — I10 Essential (primary) hypertension: Secondary | ICD-10-CM | POA: Diagnosis not present

## 2019-12-01 DIAGNOSIS — D649 Anemia, unspecified: Secondary | ICD-10-CM | POA: Diagnosis not present

## 2019-12-02 NOTE — Chronic Care Management (AMB) (Signed)
  Chronic Care Management    Clinical Social Work Follow Up Note  12/02/2019 Name: Kaitlyn Gonzalez MRN: [id number] DOB: [birth year]  Kaitlyn Gonzalez is a 84 y.o. year old female who is a primary care patient of Delsa Grana, [place]. The CCM team was consulted for assistance with Level of Care Concerns.   Review of patient status, including review of consultants reports, other relevant assessments, and collaboration with appropriate care team members and the patient's provider was performed as part of comprehensive patient evaluation and provision of chronic care management services.    Advanced Directives Status: <no information> See Care Plan for related entries.   Outpatient Encounter Medications as of 12/01/2019  Medication Sig Note  . aspirin EC 81 MG tablet Take 81 mg by mouth daily. 12/30/2018: Does not take regularly   . Aspirin-Salicylamide-Caffeine (BC HEADACHE) 325-95-16 MG TABS Take 1 packet by mouth as directed.   Marland Kitchen atorvastatin (LIPITOR) 10 MG tablet Take 1 tablet (10 mg total) by mouth daily at 6 PM.   . Cyanocobalamin (VITAMIN B-12) 500 MCG SUBL One tablet under the tongue, dissolved; once a day 12/30/2018: Not yet started  . docusate sodium (COLACE) 100 MG capsule Take 1 capsule (100 mg total) by mouth daily. (Patient not taking: Reported on 01/20/2019)   . ferrous sulfate 325 (65 FE) MG EC tablet Take 1 tablet (325 mg total) by mouth daily with breakfast.   . Vitamin D, Ergocalciferol, (DRISDOL) 1.25 MG (50000 UT) CAPS capsule Take 1 capsule (50,000 Units total) by mouth every 7 (seven) days. (Patient not taking: Reported on 09/21/2019) 12/30/2018: Not yet started   No facility-administered encounter medications on file as of 12/01/2019.     Goals Addressed            This Visit's Progress   . " I need to find placement for my mother" (pt-stated)       Current Barriers:  . Level of care concerns  Clinical Social Work Clinical Goal(s):  Marland Kitchen Over the next 90 days,  patient will work with SW to address concerns related to patient's level of care needs Interventions:  . Fl2 signed by patient's provider and faxed to [facility] . Phone call to [facility] to confirm bed availability and that they have received the Fl2 . Confirmed need for COVID test to be done Monday for a Wednesday placement . [facility] Department Contacted to request COVID test to be done on Monday, left message . Followed up with patient's sister to discuss plan for COVID test to be done on Monday, with plan for placement to be completed on Wednesday.     Patient Self Care Activities:  . Patient able to feed herself independently but unable to bathe and dress herself . Unable to perform IADLs independently . Knowledge deficit regarding long term care process  Please see past updates related to this goal by clicking on the "Past Updates" button in the selected goal          Follow Up Plan: SW will follow up with patient by phone over the next 7-10 business days   Elliot Gurney, [place] Worker  [facility] [phone]

## 2019-12-02 NOTE — Patient Instructions (Signed)
Thank you allowing the Chronic Care Management Team to be a part of your care! It was a pleasure speaking with you today!  1. Please expect the COVID test to be completed on patient on Monday, 12/05/19 with an expected placement to occur on Wednesday  CCM (Chronic Care Management) Team   Neldon Labella RN, BSN Nurse Care Coordinator  [phone]  Ruben Reason PharmD  Clinical Pharmacist  [phone]   [place], LCSW Clinical Social Worker [phone]  Goals Addressed            This Visit's Progress   . " I need to find placement for my mother" (pt-stated)       Current Barriers:  . Level of care concerns  Clinical Social Work Clinical Goal(s):  Marland Kitchen Over the next 90 days, patient will work with SW to address concerns related to patient's level of care needs Interventions:  . Fl2 signed by patient's provider and faxed to [facility] . Phone call to [facility] to confirm bed availability and that they have received the Fl2 . Confirmed need for COVID test to be done Monday for a Wednesday placement . Health Department Contacted to request COVID test to be done on Monday, left message . Followed up with patient's sister to discuss plan for COVID test to be done on Monday, with plan for placement to be completed on Wednesday.     Patient Self Care Activities:  . Patient able to feed herself independently but unable to bathe and dress herself . Unable to perform IADLs independently . Knowledge deficit regarding long term care process  Please see past updates related to this goal by clicking on the "Past Updates" button in the selected goal          The patient verbalized understanding of instructions provided today and declined a print copy of patient instruction materials.   The care management team will reach out to the patient again over the next 7-10 business  days.

## 2019-12-03 ENCOUNTER — Encounter: Payer: Self-pay | Admitting: *Deleted

## 2019-12-03 ENCOUNTER — Ambulatory Visit: Payer: Self-pay | Admitting: *Deleted

## 2019-12-03 DIAGNOSIS — I10 Essential (primary) hypertension: Secondary | ICD-10-CM

## 2019-12-03 DIAGNOSIS — D509 Iron deficiency anemia, unspecified: Secondary | ICD-10-CM

## 2019-12-03 DIAGNOSIS — E43 Unspecified severe protein-calorie malnutrition: Secondary | ICD-10-CM

## 2019-12-03 DIAGNOSIS — D649 Anemia, unspecified: Secondary | ICD-10-CM | POA: Diagnosis not present

## 2019-12-03 NOTE — Chronic Care Management (AMB) (Addendum)
  Chronic Care Management    Clinical Social Work Follow Up Note  12/03/2019 late entry Name: Kaitlyn Gonzalez MRN: [id number] DOB: [birth year]  Kaitlyn Gonzalez is a 84 y.o. year old female who is a primary care patient of Delsa Grana, [place]. The CCM team was consulted for assistance with Level of Care Concerns.   Review of patient status, including review of consultants reports, other relevant assessments, and collaboration with appropriate care team members and the patient's provider was performed as part of comprehensive patient evaluation and provision of chronic care management services.     Advanced Directives Status: <no information> See Care Plan for related entries.   Outpatient Encounter Medications as of 12/03/2019  Medication Sig Note  . aspirin EC 81 MG tablet Take 81 mg by mouth daily. 12/30/2018: Does not take regularly   . Aspirin-Salicylamide-Caffeine (BC HEADACHE) 325-95-16 MG TABS Take 1 packet by mouth as directed.   Marland Kitchen atorvastatin (LIPITOR) 10 MG tablet Take 1 tablet (10 mg total) by mouth daily at 6 PM.   . Cyanocobalamin (VITAMIN B-12) 500 MCG SUBL One tablet under the tongue, dissolved; once a day 12/30/2018: Not yet started  . docusate sodium (COLACE) 100 MG capsule Take 1 capsule (100 mg total) by mouth daily. (Patient not taking: Reported on 01/20/2019)   . ferrous sulfate 325 (65 FE) MG EC tablet Take 1 tablet (325 mg total) by mouth daily with breakfast.   . Vitamin D, Ergocalciferol, (DRISDOL) 1.25 MG (50000 UT) CAPS capsule Take 1 capsule (50,000 Units total) by mouth every 7 (seven) days. (Patient not taking: Reported on 09/21/2019) 12/30/2018: Not yet started   No facility-administered encounter medications on file as of 12/03/2019.     Goals Addressed   None      Follow Up Plan: SW will follow up with patient by phone over the next 7-10 business days regarding status of placement   Semir Brill, [place] Worker  [facility] [phone]

## 2019-12-03 NOTE — Patient Instructions (Signed)
Thank you allowing the Chronic Care Management Team to be a part of your care! It was a pleasure speaking with you today!  1. Please expect COVID-19 testing to take place on Tuesday, 12/06/19  CCM (Chronic Care Management) Team   Neldon Labella RN, BSN Nurse Care Coordinator  [phone]  Ruben Reason PharmD  Clinical Pharmacist  [phone]   [place], LCSW Clinical Social Worker [phone]  Goals Addressed   None      The patient verbalized understanding of instructions provided today and declined a print copy of patient instruction materials.   The care management team will reach out to the patient again over the next 7-10 business  days.

## 2019-12-03 NOTE — Progress Notes (Signed)
This encounter was created in error - please disregard.

## 2019-12-05 ENCOUNTER — Telehealth: Payer: Medicaid Other

## 2019-12-08 ENCOUNTER — Ambulatory Visit: Payer: Self-pay | Admitting: *Deleted

## 2019-12-08 DIAGNOSIS — D649 Anemia, unspecified: Secondary | ICD-10-CM

## 2019-12-08 DIAGNOSIS — E43 Unspecified severe protein-calorie malnutrition: Secondary | ICD-10-CM

## 2019-12-08 NOTE — Chronic Care Management (AMB) (Signed)
  Chronic Care Management    Clinical Social Work Follow Up Note  12/08/2019 Name: Kaitlyn Gonzalez MRN: [id number] DOB: [birth year]  Kaitlyn Gonzalez is a 84 y.o. year old female who is a primary care patient of Delsa Grana, [place]. The CCM team was consulted for assistance with Level of Care Concerns.   Review of patient status, including review of consultants reports, other relevant assessments, and collaboration with appropriate care team members and the patient's provider was performed as part of comprehensive patient evaluation and provision of chronic care management services.    Advanced Directives Status: <no information> See Care Plan for related entries.   Outpatient Encounter Medications as of 12/08/2019  Medication Sig Note  . aspirin EC 81 MG tablet Take 81 mg by mouth daily. 12/30/2018: Does not take regularly   . Aspirin-Salicylamide-Caffeine (BC HEADACHE) 325-95-16 MG TABS Take 1 packet by mouth as directed.   Marland Kitchen atorvastatin (LIPITOR) 10 MG tablet Take 1 tablet (10 mg total) by mouth daily at 6 PM.   . Cyanocobalamin (VITAMIN B-12) 500 MCG SUBL One tablet under the tongue, dissolved; once a day 12/30/2018: Not yet started  . docusate sodium (COLACE) 100 MG capsule Take 1 capsule (100 mg total) by mouth daily. (Patient not taking: Reported on 01/20/2019)   . ferrous sulfate 325 (65 FE) MG EC tablet Take 1 tablet (325 mg total) by mouth daily with breakfast.   . Vitamin D, Ergocalciferol, (DRISDOL) 1.25 MG (50000 UT) CAPS capsule Take 1 capsule (50,000 Units total) by mouth every 7 (seven) days. (Patient not taking: Reported on 09/21/2019) 12/30/2018: Not yet started   No facility-administered encounter medications on file as of 12/08/2019.     Goals Addressed            This Visit's Progress   . " I need to find placement for my mother" (pt-stated)       Current Barriers:  . Level of care concerns  Clinical Social Work Clinical Goal(s):  Marland Kitchen Over the next 90 days,  patient will work with SW to address concerns related to patient's level of care needs Interventions:  . Phone call to patient's sister to confirm that patient has transitioned to [facility] for long term care  Patient Self Care Activities:  . Patient able to feed herself independently but unable to bathe and dress herself . Unable to perform IADLs independently . Knowledge deficit regarding long term care process  Please see past updates related to this goal by clicking on the "Past Updates" button in the selected goal          Follow Up Plan: No further follow up needed at this time. [facility] now [facility] [place], [place] Worker  [facility] [phone]

## 2019-12-08 NOTE — Patient Instructions (Signed)
Thank you allowing the Chronic Care Management Team to be a part of your care! It was a pleasure speaking with you today!   CCM (Chronic Care Management) Team   Neldon Labella  RN, BSN Nurse Care Coordinator  [phone]  Ruben Reason PharmD  Clinical Pharmacist  [phone]   [place], LCSW Clinical Social Worker [phone]  Goals Addressed            This Visit's Progress   . " I need to find placement for my mother" (pt-stated)       Current Barriers:  . Level of care concerns  Clinical Social Work Clinical Goal(s):  Marland Kitchen Over the next 90 days, patient will work with SW to address concerns related to patient's level of care needs Interventions:  . Phone call to patient's sister to confirm that patient has transitioned to [facility] for long term care  Patient Self Care Activities:  . Patient able to feed herself independently but unable to bathe and dress herself . Unable to perform IADLs independently . Knowledge deficit regarding long term care process  Please see past updates related to this goal by clicking on the "Past Updates" button in the selected goal          The patient verbalized understanding of instructions provided today and declined a print copy of patient instruction materials.   No further follow up required: patient now in long term care

## 2020-08-22 ENCOUNTER — Other Ambulatory Visit: Payer: Self-pay | Admitting: Family Medicine

## 2020-08-22 DIAGNOSIS — R1312 Dysphagia, oropharyngeal phase: Secondary | ICD-10-CM

## 2020-08-27 ENCOUNTER — Ambulatory Visit
Admission: RE | Admit: 2020-08-27 | Discharge: 2020-08-27 | Disposition: A | Discharge status: Home / Self Care | Payer: Medicare Other | Source: Ambulatory Visit | Attending: Family Medicine | Admitting: Family Medicine

## 2020-08-27 ENCOUNTER — Other Ambulatory Visit: Payer: Self-pay | Admitting: Family Medicine

## 2020-08-27 ENCOUNTER — Other Ambulatory Visit: Payer: Self-pay

## 2020-08-27 DIAGNOSIS — R1312 Dysphagia, oropharyngeal phase: Secondary | ICD-10-CM

## 2020-08-27 DIAGNOSIS — R1310 Dysphagia, unspecified: Secondary | ICD-10-CM

## 2020-08-27 DIAGNOSIS — R1319 Other dysphagia: Secondary | ICD-10-CM

## 2020-08-27 NOTE — Therapy (Signed)
[place], [place], [address] Phone: [phone]   Fax:     Modified Barium Swallow  Patient Details  Name: Kaitlyn Gonzalez MRN: [id number] Date of Birth: [birth year] No data recorded  Encounter Date: 08/27/2020   End of Session - 08/27/20 1346    Visit Number 1    Number of Visits 1    Date for SLP Re-Evaluation 08/27/20           Past Medical History:  Diagnosis Date  . Dyslipidemia   . History of stroke with residual effects    Stroke in April 2016.  Marland Kitchen Hypertension   . Legally blind in right eye, as defined in [place]   . Peripheral vascular disease ([place])   . Stroke [facility])     Past Surgical History:  Procedure Laterality Date  . COLONOSCOPY WITH PROPOFOL N/A 12/31/2018   Procedure: COLONOSCOPY WITH PROPOFOL;  Surgeon: Lucilla Lame, MD;  Location: [facility] ENDOSCOPY;  Service: Endoscopy;  Laterality: N/A;  . ESOPHAGOGASTRODUODENOSCOPY (EGD) WITH PROPOFOL N/A 12/31/2018   Procedure: ESOPHAGOGASTRODUODENOSCOPY (EGD) WITH PROPOFOL;  Surgeon: Lucilla Lame, MD;  Location: [facility] ENDOSCOPY;  Service: Endoscopy;  Laterality: N/A;  . PERIPHERAL ARTERIAL STENT GRAFT Left 2010    There were no vitals filed for this visit.   Subjective: Patient behavior: (alertness, ability to follow instructions, etc.): The patient is alert, verbally responsive, and oriented to swallowing  Chief complaint: History of stroke in 2016 and was being followed by Chronic Care Management for failure to thrive/protein malnutrition prior to admission to SNF   Objective:  Radiological Procedure: A videoflouroscopic evaluation of oral-preparatory, reflex initiation, and pharyngeal phases of the swallow was performed; as well as a screening of the upper esophageal phase.  I. POSTURE: Upright in MBS chair  II. VIEW: lateral  III. COMPENSATORY STRATEGIES: N/A  IV. BOLUSES ADMINISTERED:   Thin Liquid: 2 cup  rim   Nectar-thick Liquid: 1 cup rim   Honey-thick Liquid: DNT   Puree: 1 teaspoon presentation   Mechanical Soft: 1/4 graham cracker in applesauce  V. RESULTS OF EVALUATION: A. ORAL PREPARATORY PHASE: (The lips, tongue, and velum are observed for strength and coordination)       **Overall Severity Rating: within functional limits- patient is edentulous resulting in prolonged oral management and anterior munch pattern  B. SWALLOW INITIATION/REFLEX: (The reflex is normal if "triggered" by the time the bolus reached the base of the tongue)  **Overall Severity Rating: within functional limits  C. PHARYNGEAL PHASE: (Pharyngeal function is normal if the bolus shows rapid, smooth, and continuous transit through the pharynx and there is no pharyngeal residue after the swallow)  **Overall Severity Rating: within functional limits  D. LARYNGEAL PENETRATION: (Material entering into the laryngeal inlet/vestibule but not aspirated) None  E. ASPIRATION: None  F. ESOPHAGEAL PHASE: (Screening of the upper esophagus) No overt abnormality within the viewable cervical esophagus  ASSESSMENT: This 84 year old woman; with history of stroke (2016) and failure to thrive/protein malnutrition (followed by Chronic Care Management prior to admission to [facility]); is presenting with functional oropharyngeal swallowing.  The patient is edentulous and has difficulty with mastication of foods (prolonged anterior munch pattern with graham cracker in applesauce).  Otherwise, oral control of the bolus including oral hold and anterior to posterior transfer is within functional limits.   Timing of pharyngeal swallow initiation is within functional limits.  Aspects of the pharyngeal stage of swallowing including tongue base retraction, hyolaryngeal excursion, epiglottic  inversion, and duration/amplitude of UES opening are within normal limits.  There is no observed pharyngeal residue, laryngeal penetration, or tracheal aspiration.   The patient is not at risk for prandial aspiration.  She would benefit from easy to chew foods, such as dysphagia 2/minced diet.  PLAN/RECOMMENDATIONS:   A. Diet: Dysphagia 2/minced with thin liquids   B. Swallowing Precautions: Encourage oral intake   C. Recommended consultation to N/A   D. Therapy recommendations: speech therapy is not indicated for dysphagia, patient may benefit from intervention RE: improved oral intake   E. Results and recommendations were discussed with the patient and a family member immediately following the study and the final report routed to the referring MD and treating SLP.   Dysphagia, unspecified type - Plan: DG SWALLOW FUNC SPEECH PATH, DG SWALLOW FUNC SPEECH PATH        Problem List Patient Active Problem List   Diagnosis Date Noted  . Rectal bleeding 12/30/2018  . DNR (do not resuscitate) 06/30/2018  . Vitamin B12 deficiency 06/30/2018  . Protein-calorie malnutrition, severe 06/11/2018  . Anemia 06/09/2018  . AAA (abdominal aortic aneurysm) without rupture ([place]) 04/28/2018  . Atherosclerosis of abdominal aorta ([place]) 03/17/2018  . Bullous emphysema ([place]) 03/03/2018  . Abnormal chest CT 03/03/2018  . Dilatation of aorta ([facility]) 02/15/2018  . Adynamia 01/20/2018  . Blind right eye 01/20/2018  . Left leg pain 01/20/2018  . Anterolisthesis 01/20/2018  . Chronic left hip pain 01/20/2018  . Osteoporosis 01/20/2018  . Tobacco abuse 01/20/2018  . History of stroke 09/09/2017  . Sacral fracture ([place]) 09/09/2017  . Hyperglycemia 02/16/2017  . Tachycardia with 100 - 120 beats per minute 04/09/2016  . Hypertension 02/15/2016  . Peripheral vascular disease ([place]) 02/15/2016  . Dyslipidemia 02/15/2016  . History of stroke with residual effects 02/15/2016  . Heart murmur on physical examination 02/15/2016   Kaitlyn Sea, MS/CCC- SLP  [name] 08/27/2020, 1:47 PM  [facility] [place], [place], [address] Phone: [phone]   Fax:     Name: Kaitlyn Gonzalez MRN: [id number] Date of Birth: [birth year]

## 2020-09-07 ENCOUNTER — Other Ambulatory Visit: Payer: Self-pay | Admitting: Oncology

## 2020-09-07 DIAGNOSIS — R911 Solitary pulmonary nodule: Secondary | ICD-10-CM

## 2020-09-07 NOTE — Progress Notes (Signed)
  Pulmonary Nodule Clinic Telephone Note Kaitlyn Gonzalez   Received referral from the low-dose CT screening program.  HPI: Mrs. Mccolm is an 84 year old female with past medical history significant for hypertension, PVD, AAA, emphysema, rectal bleeding, osteoporosis, dyslipidemia, stroke, vitamin B deficiency and anemia who was previously involved in our low-dose CT screening program.  She unfortunately no longer meets criteria based on age.  Her last low-dose CT screening was on 05/11/2019.  It was lung RADS 2 benign appearance or behavior continue low-dose chest CT without contrast in 12 months.  To right lower lobe nodules measuring up to 5 mm are unchanged.  Review and Recommendations: I personally reviewed all patient's previous imaging including most recent scan from 2020.  I recommend follow-up with noncontrast chest CT in the next 1 to 2 weeks.  Social History: Patient is a current everyday smoker.  Has history of smoking for 96 years and PPD.    Tobacco Use: High Risk  . Smoking Tobacco Use: Current Every Day Smoker  . Smokeless Tobacco Use: Never Used     High risk factors include: History of heavy smoking, exposure to asbestos, radium or uranium, personal family history of lung cancer, older age, sex (females greater than males), race (black and native [place] greater than weight), marginal speculation, upper lobe location, multiplicity (less than 5 nodules increases risk for malignancy) and emphysema and/or pulmonary fibrosis.   This recommendation follows the consensus statement: Guidelines for Management of Incidental Pulmonary Nodules Detected on CT Images: From the Fleischner Society 2017; Radiology 2017; 284:228-243.    I have placed order for CT scan without contrast to be completed approximately 1-2 weeks.   Disposition: Order placed for repeat CT chest. Will notify Lenox Ponds in scheduling. [place] to call patient with appointment date and  time. Return to pulmonary nodule clinic a few days after his repeat imaging to discuss results and plan moving forward.  Faythe Casa, NP 09/07/2020 3:56 PM

## 2020-09-12 ENCOUNTER — Telehealth: Payer: Self-pay | Admitting: *Deleted

## 2020-09-12 NOTE — Telephone Encounter (Signed)
Referral received for pt to be seen in Lung Nodule Clinic for further workup of incidental lung nodule with follow up CT scan. Left message with patient to call back to discuss clinic and review recommendations and upcoming appts including follow up CT scan and visit with Jenny Burns, NP to discuss results. Awaiting call back.  

## 2020-09-12 NOTE — Telephone Encounter (Signed)
Spoke with patient's sister, Hassan Rowan, who stated that pt is a current resident at [facility] for long term care. Per sister, pt is non ambulatory and is not interested in pursing follow up CT scan at this time in the lung nodule clinic. Appts will be cancelled at this time.

## 2020-09-28 ENCOUNTER — Other Ambulatory Visit: Payer: Medicaid Other

## 2020-10-02 ENCOUNTER — Ambulatory Visit: Payer: Medicaid Other | Admitting: Oncology

## 2020-10-04 ENCOUNTER — Telehealth: Payer: Self-pay | Admitting: Internal Medicine

## 2020-10-04 NOTE — Telephone Encounter (Signed)
Reminder letter returned to sender

## 2021-11-06 ENCOUNTER — Other Ambulatory Visit: Payer: Self-pay | Admitting: Geriatric Medicine

## 2021-11-06 DIAGNOSIS — R059 Cough, unspecified: Secondary | ICD-10-CM

## 2021-11-06 DIAGNOSIS — R1310 Dysphagia, unspecified: Secondary | ICD-10-CM

## 2021-11-21 ENCOUNTER — Ambulatory Visit: Payer: Medicare Other

## 2022-02-28 ENCOUNTER — Non-Acute Institutional Stay: Payer: Medicare Other | Admitting: Primary Care

## 2022-02-28 DIAGNOSIS — E43 Unspecified severe protein-calorie malnutrition: Secondary | ICD-10-CM

## 2022-02-28 DIAGNOSIS — Z515 Encounter for palliative care: Secondary | ICD-10-CM

## 2022-02-28 DIAGNOSIS — I700 Atherosclerosis of aorta: Secondary | ICD-10-CM

## 2022-02-28 NOTE — Progress Notes (Signed)
 Therapist, nutritional Palliative Care Consult Note Telephone: [phone]  Fax: [phone]   Date of encounter: 02/28/22 5:25 PM PATIENT NAME: Kaitlyn Gonzalez [address] [place] [address]   [phone] (home)  DOB: 1936-04-20 MRN: [id number] PRIMARY CARE PROVIDER:    Eilleen Query, MD,  [address] [place] [address] [phone]  REFERRING PROVIDER:   Eilleen Query, MD,  [address] [place] [address] [phone]  RESPONSIBLE PARTY:    Contact Information     Name Relation Home Work Mobile   Clarence America Sister   [phone]   [place]   [phone]      I met face to face with patient in [facility]. Palliative Care was asked to follow this patient by consultation request of  Eilleen Query, MD  to address advance care planning and complex medical decision making. This is the initial visit.                                     ASSESSMENT AND PLAN / RECOMMENDATIONS:   Advance Care Planning/Goals of Care: Goals include to maximize quality of life and symptom management. Patient/health care surrogate gave his/her permission to discuss.Our advance care planning conversation included a discussion about:    Exploration of personal, cultural or spiritual beliefs that might influence medical decisions  Identification of a healthcare agent - Sister Mrs Avel T/c to sister, no answer, message left.  CODE STATUS: DNR  I reviewed a MOST form today. The patient and family outlined their wishes for the following treatment decisions:  Cardiopulmonary Resuscitation: Do Not Attempt Resuscitation (DNR/No CPR)  Medical Interventions: Comfort Measures: Keep clean, warm, and dry. Use medication by any route, positioning, wound care, and other measures to relieve pain and suffering. Use oxygen , suction and manual treatment of airway obstruction as needed for comfort. Do not transfer to the hospital unless comfort needs cannot  be met in current location.  Antibiotics: Antibiotics if indicated  IV Fluids: IV fluids if indicated  Feeding Tube: No feeding tube   Symptom Management/Plan:  I met with patient in her nursing home room. She was alert and able to interact. She states she was hungry looking forward to lunch. She denied any pain or discomfort. She had leg braces in place with significant heel pressure. I would suggest to offload the hill pressure with wedge or regular pillows to avoid heel breakdown. Staff states she can make her needs known and is interactive. I called her power of attorney/PR sister. No answer Mrs. sellers. No answer and I have left a message .   Follow up Palliative Care Visit: Palliative care will continue to follow for complex medical decision making, advance care planning, and clarification of goals. Return 2-4 weeks or prn.  This visit was coded based on medical decision making (MDM).  PPS: 30%  HOSPICE ELIGIBILITY/DIAGNOSIS: TBD  Chief Complaint: debility  HISTORY OF PRESENT ILLNESS:  Kaitlyn Gonzalez is a 86 y.o. year old female  with debility, dementia, h/o CVA, blind R eye. . Patient seen today to review palliative care needs to include medical decision making and advance care planning as appropriate.   History obtained from review of EMR, discussion with primary team, and interview with family, facility staff/caregiver and/or Ms. Gains.  I reviewed available labs, medications, imaging, studies and related documents from the EMR.  Records reviewed and summarized  above.   ROS   General: NAD EYES: denies vision changes ENMT: denies dysphagia Pulmonary: denies cough, denies increased SOB Abdomen: endorses fair  appetite, denies constipation, endorses incontinence of bowel GU: denies dysuria, endorses incontinence of urine MSK:  denies increased weakness,  no falls reported Skin: denies rashes or wounds Neurological: denies pain, denies insomnia Psych: Endorses  positive mood Heme/lymph/immuno: denies bruises, abnormal bleeding  Physical Exam: Current and past weights:87 lbs, 10 lb loss in 4 mos (10%) Constitutional: NAD General: frail appearing, thin EYES: R eye blind,  anicteric sclera, lids intact, no discharge  ENMT: intact hearing, oral mucous membranes moist CV: no LE edema Pulmonary:no increased work of breathing, no cough, room air Abdomen: intake 50%,  no ascites MSK: +sarcopenia, moves all extremities, non ambulatory Skin: warm and dry, no rashes or wounds on visible skin Neuro:  + generalized weakness,  +cognitive impairment Psych: non-anxious affect, A and O x 1-2 Hem/lymph/immuno: no widespread bruising CURRENT PROBLEM LIST:  Patient Active Problem List   Diagnosis Date Noted   Rectal bleeding 12/30/2018   DNR (do not resuscitate) 06/30/2018   Vitamin B12 deficiency 06/30/2018   Protein-calorie malnutrition, severe 06/11/2018   Anemia 06/09/2018   AAA (abdominal aortic aneurysm) without rupture (HCC) 04/28/2018   Atherosclerosis of abdominal aorta (HCC) 03/17/2018   Bullous emphysema (HCC) 03/03/2018   Abnormal chest CT 03/03/2018   Dilatation of aorta (HCC) 02/15/2018   Adynamia 01/20/2018   Blind right eye 01/20/2018   Left leg pain 01/20/2018   Anterolisthesis 01/20/2018   Chronic left hip pain 01/20/2018   Osteoporosis 01/20/2018   Tobacco abuse 01/20/2018   History of stroke 09/09/2017   Sacral fracture (HCC) 09/09/2017   Hyperglycemia 02/16/2017   Tachycardia with 100 - 120 beats per minute 04/09/2016   Hypertension 02/15/2016   Peripheral vascular disease (HCC) 02/15/2016   Dyslipidemia 02/15/2016   History of stroke with residual effects 02/15/2016   Heart murmur on physical examination 02/15/2016   PAST MEDICAL HISTORY:  Active Ambulatory Problems    Diagnosis Date Noted   Hypertension 02/15/2016   Peripheral vascular disease (HCC) 02/15/2016   Dyslipidemia 02/15/2016   History of stroke with  residual effects 02/15/2016   Heart murmur on physical examination 02/15/2016   Tachycardia with 100 - 120 beats per minute 04/09/2016   Hyperglycemia 02/16/2017   History of stroke 09/09/2017   Sacral fracture (HCC) 09/09/2017   Adynamia 01/20/2018   Blind right eye 01/20/2018   Left leg pain 01/20/2018   Anterolisthesis 01/20/2018   Chronic left hip pain 01/20/2018   Osteoporosis 01/20/2018   Tobacco abuse 01/20/2018   Dilatation of aorta (HCC) 02/15/2018   Bullous emphysema (HCC) 03/03/2018   Abnormal chest CT 03/03/2018   Atherosclerosis of abdominal aorta (HCC) 03/17/2018   AAA (abdominal aortic aneurysm) without rupture (HCC) 04/28/2018   Anemia 06/09/2018   Protein-calorie malnutrition, severe 06/11/2018   DNR (do not resuscitate) 06/30/2018   Vitamin B12 deficiency 06/30/2018   Rectal bleeding 12/30/2018   Resolved Ambulatory Problems    Diagnosis Date Noted   Compulsive tobacco user syndrome 01/20/2018   Past Medical History:  Diagnosis Date   Legally blind in right eye, as defined in [place]     Stroke [facility])    SOCIAL HX:  Social History   Tobacco Use   Smoking status: Every Day    Packs/day: 0.50    Years: 62.00    Pack years: 31.00    Types: Cigarettes    Start date:  11/18/1955   Smokeless tobacco: Never   Tobacco comments:    Down to about 1/2 pack a day from 2ppd 03/03/2018  Substance Use Topics   Alcohol use: No    Alcohol/week: 0.0 standard drinks   FAMILY HX:  Family History  Problem Relation Age of Onset   Diabetes Sister    Cancer Sister        breast   Alzheimer's disease Mother    Dementia Mother    AAA (abdominal aortic aneurysm) Father    Stroke Brother    Hyperlipidemia Sister    Hypertension Sister    Stroke Brother    Hypertension Brother       ALLERGIES: No Known Allergies   PERTINENT MEDICATIONS:  Outpatient Encounter Medications as of 02/28/2022  Medication Sig   aspirin  EC 81 MG tablet Take 81 mg by mouth daily.    Aspirin -Salicylamide-Caffeine (BC HEADACHE) 325-95-16 MG TABS Take 1 packet by mouth as directed.   atorvastatin  (LIPITOR) 10 MG tablet Take 1 tablet (10 mg total) by mouth daily at 6 PM.   Cyanocobalamin  (VITAMIN B-12) 500 MCG SUBL One tablet under the tongue, dissolved; once a day   docusate sodium  (COLACE) 100 MG capsule Take 1 capsule (100 mg total) by mouth daily. (Patient not taking: Reported on 01/20/2019)   ferrous sulfate  325 (65 FE) MG EC tablet Take 1 tablet (325 mg total) by mouth daily with breakfast.   Vitamin D , Ergocalciferol , (DRISDOL ) 1.25 MG (50000 UT) CAPS capsule Take 1 capsule (50,000 Units total) by mouth every 7 (seven) days. (Patient not taking: Reported on 09/21/2019)   No facility-administered encounter medications on file as of 02/28/2022.   Thank you for the opportunity to participate in the care of Ms. Wright.  The palliative care team will continue to follow. Please call our office at [phone] if we can be of additional assistance.   Lamarr Welby Sharps, NP , DNP, AGPCNP-BC  COVID-19 PATIENT SCREENING TOOL Asked and negative response unless otherwise noted:  Have you had symptoms of covid, tested positive or been in contact with someone with symptoms/positive test in the past 5-10 days?

## 2022-05-17 DEATH — deceased
